# Patient Record
Sex: Female | Born: 1935
Health system: Southern US, Community
[De-identification: ages and names within clinical notes are randomized; demographics above are authoritative.]

## PROBLEM LIST (undated history)

## (undated) DIAGNOSIS — M810 Age-related osteoporosis without current pathological fracture: Secondary | ICD-10-CM

## (undated) DIAGNOSIS — I10 Essential (primary) hypertension: Secondary | ICD-10-CM

## (undated) DIAGNOSIS — M4800 Spinal stenosis, site unspecified: Secondary | ICD-10-CM

## (undated) DIAGNOSIS — E785 Hyperlipidemia, unspecified: Secondary | ICD-10-CM

## (undated) DIAGNOSIS — M1990 Unspecified osteoarthritis, unspecified site: Secondary | ICD-10-CM

## (undated) DIAGNOSIS — M5430 Sciatica, unspecified side: Secondary | ICD-10-CM

## (undated) DIAGNOSIS — M8580 Other specified disorders of bone density and structure, unspecified site: Secondary | ICD-10-CM

## (undated) DIAGNOSIS — M5126 Other intervertebral disc displacement, lumbar region: Secondary | ICD-10-CM

## (undated) HISTORY — DX: Hyperlipidemia, unspecified: E78.5

## (undated) HISTORY — DX: Spinal stenosis, site unspecified: M48.00

## (undated) HISTORY — DX: Sciatica, unspecified side: M54.30

## (undated) HISTORY — DX: Other specified disorders of bone density and structure, unspecified site: M85.80

## (undated) HISTORY — DX: Age-related osteoporosis without current pathological fracture: M81.0

## (undated) HISTORY — PX: FRACTURE SURGERY: SHX138

## (undated) HISTORY — DX: Other intervertebral disc displacement, lumbar region: M51.26

---

## 1999-05-11 ENCOUNTER — Ambulatory Visit (HOSPITAL_COMMUNITY): Admission: RE | Admit: 1999-05-11 | Discharge: 1999-05-11 | Payer: Self-pay | Admitting: Internal Medicine

## 2004-03-05 ENCOUNTER — Emergency Department (HOSPITAL_COMMUNITY): Admission: EM | Admit: 2004-03-05 | Discharge: 2004-03-05 | Payer: Self-pay | Admitting: Emergency Medicine

## 2007-10-22 ENCOUNTER — Ambulatory Visit (HOSPITAL_COMMUNITY): Admission: RE | Admit: 2007-10-22 | Discharge: 2007-10-22 | Payer: Self-pay | Admitting: Chiropractic Medicine

## 2009-03-07 ENCOUNTER — Inpatient Hospital Stay (HOSPITAL_COMMUNITY): Admission: EM | Admit: 2009-03-07 | Discharge: 2009-03-09 | Payer: Self-pay | Admitting: Emergency Medicine

## 2009-03-09 ENCOUNTER — Encounter (INDEPENDENT_AMBULATORY_CARE_PROVIDER_SITE_OTHER): Payer: Self-pay | Admitting: Internal Medicine

## 2010-03-27 HISTORY — PX: OTHER SURGICAL HISTORY: SHX169

## 2010-06-28 LAB — BASIC METABOLIC PANEL
BUN: 19 mg/dL (ref 6–23)
CO2: 29 mEq/L (ref 19–32)
Calcium: 9.3 mg/dL (ref 8.4–10.5)
Chloride: 103 mEq/L (ref 96–112)
Creatinine, Ser: 0.98 mg/dL (ref 0.4–1.2)
GFR calc Af Amer: >60 mL/min (ref >60)
GFR calc non Af Amer: 55 mL/min — ABNORMAL LOW (ref >60)
Glucose, Bld: 100 mg/dL — ABNORMAL HIGH (ref 70–99)
Potassium: 3.2 mEq/L — ABNORMAL LOW (ref 3.5–5.1)
Sodium: 139 mEq/L (ref 135–145)

## 2010-06-28 LAB — COMPREHENSIVE METABOLIC PANEL
ALT: 24 U/L (ref 0–35)
AST: 31 U/L (ref 0–37)
Albumin: 4.1 g/dL (ref 3.5–5.2)
Alkaline Phosphatase: 101 U/L (ref 39–117)
BUN: 17 mg/dL (ref 6–23)
CO2: 29 mEq/L (ref 19–32)
Calcium: 10 mg/dL (ref 8.4–10.5)
Chloride: 97 mEq/L (ref 96–112)
Creatinine, Ser: 0.89 mg/dL (ref 0.4–1.2)
GFR calc Af Amer: >60 mL/min (ref >60)
GFR calc non Af Amer: >60 mL/min (ref >60)
Glucose, Bld: 108 mg/dL — ABNORMAL HIGH (ref 70–99)
Potassium: 4.9 mEq/L (ref 3.5–5.1)
Sodium: 135 mEq/L (ref 135–145)
Total Bilirubin: 1 mg/dL (ref 0.3–1.2)
Total Protein: 7.6 g/dL (ref 6.0–8.3)

## 2010-06-28 LAB — LIPID PANEL
Cholesterol: 170 mg/dL (ref 0–200)
HDL: 52 mg/dL (ref >39)
LDL Cholesterol: 96 mg/dL (ref 0–99)
Total CHOL/HDL Ratio: 3.3 RATIO
Triglycerides: 109 mg/dL (ref <150)
VLDL: 22 mg/dL (ref 0–40)

## 2010-06-28 LAB — CBC
HCT: 40.6 % (ref 36.0–46.0)
HCT: 43.4 % (ref 36.0–46.0)
Hemoglobin: 13.7 g/dL (ref 12.0–15.0)
Hemoglobin: 14.9 g/dL (ref 12.0–15.0)
MCHC: 33.8 g/dL (ref 30.0–36.0)
MCHC: 34.2 g/dL (ref 30.0–36.0)
MCV: 93.6 fL (ref 78.0–100.0)
MCV: 94.5 fL (ref 78.0–100.0)
Platelets: 252 10*3/uL (ref 150–400)
Platelets: 268 10*3/uL (ref 150–400)
RBC: 4.3 MIL/uL (ref 3.87–5.11)
RBC: 4.64 MIL/uL (ref 3.87–5.11)
RDW: 13.3 % (ref 11.5–15.5)
RDW: 13.3 % (ref 11.5–15.5)
WBC: 10.4 10*3/uL (ref 4.0–10.5)
WBC: 8.3 10*3/uL (ref 4.0–10.5)

## 2010-06-28 LAB — CARDIAC PANEL(CRET KIN+CKTOT+MB+TROPI)
CK, MB: 1.9 ng/mL (ref 0.3–4.0)
CK, MB: 1.9 ng/mL (ref 0.3–4.0)
Relative Index: INVALID (ref 0.0–2.5)
Relative Index: INVALID (ref 0.0–2.5)
Total CK: 69 U/L (ref 7–177)
Total CK: 86 U/L (ref 7–177)
Troponin I: 0.01 ng/mL (ref 0.00–0.06)
Troponin I: 0.02 ng/mL (ref 0.00–0.06)

## 2010-06-28 LAB — POCT CARDIAC MARKERS
CKMB, poc: 2.1 ng/mL (ref 1.0–8.0)
Myoglobin, poc: 109 ng/mL (ref 12–200)
Troponin i, poc: <0.05 ng/mL (ref 0.00–0.09)

## 2010-06-28 LAB — DIFFERENTIAL
Basophils Absolute: 0.1 10*3/uL (ref 0.0–0.1)
Basophils Relative: 1 % (ref 0–1)
Eosinophils Absolute: 0 10*3/uL (ref 0.0–0.7)
Eosinophils Relative: 0 % (ref 0–5)
Lymphocytes Relative: 25 % (ref 12–46)
Lymphs Abs: 2.6 10*3/uL (ref 0.7–4.0)
Monocytes Absolute: 1.4 10*3/uL — ABNORMAL HIGH (ref 0.1–1.0)
Monocytes Relative: 13 % — ABNORMAL HIGH (ref 3–12)
Neutro Abs: 6.3 10*3/uL (ref 1.7–7.7)
Neutrophils Relative %: 61 % (ref 43–77)

## 2010-06-28 LAB — CK TOTAL AND CKMB (NOT AT ARMC)
CK, MB: 2.2 ng/mL (ref 0.3–4.0)
Relative Index: INVALID (ref 0.0–2.5)
Total CK: 73 U/L (ref 7–177)

## 2010-06-28 LAB — T4, FREE: Free T4: 1.28 ng/dL (ref 0.80–1.80)

## 2010-06-28 LAB — TSH: TSH: 6.157 u[IU]/mL — ABNORMAL HIGH (ref 0.350–4.500)

## 2010-06-28 LAB — T3: T3, Total: 98 ng/dl (ref 80.0–204.0)

## 2010-06-28 LAB — TROPONIN I: Troponin I: 0.02 ng/mL (ref 0.00–0.06)

## 2010-07-28 ENCOUNTER — Ambulatory Visit: Payer: Medicare Other | Attending: Family Medicine

## 2010-07-28 DIAGNOSIS — M2569 Stiffness of other specified joint, not elsewhere classified: Secondary | ICD-10-CM | POA: Insufficient documentation

## 2010-07-28 DIAGNOSIS — IMO0001 Reserved for inherently not codable concepts without codable children: Secondary | ICD-10-CM | POA: Insufficient documentation

## 2010-07-28 DIAGNOSIS — M25569 Pain in unspecified knee: Secondary | ICD-10-CM | POA: Insufficient documentation

## 2010-07-28 DIAGNOSIS — M25559 Pain in unspecified hip: Secondary | ICD-10-CM | POA: Insufficient documentation

## 2010-07-28 DIAGNOSIS — R5381 Other malaise: Secondary | ICD-10-CM | POA: Insufficient documentation

## 2010-08-01 ENCOUNTER — Ambulatory Visit: Payer: Medicare Other | Admitting: Physical Therapy

## 2010-08-02 ENCOUNTER — Ambulatory Visit: Payer: Self-pay | Admitting: Physical Therapy

## 2010-08-03 ENCOUNTER — Ambulatory Visit: Payer: Medicare Other

## 2010-08-08 ENCOUNTER — Encounter: Payer: Medicare Other | Admitting: Physical Therapy

## 2010-08-10 ENCOUNTER — Ambulatory Visit: Payer: Medicare Other

## 2010-08-15 ENCOUNTER — Ambulatory Visit: Payer: Medicare Other | Admitting: Physical Therapy

## 2010-08-17 ENCOUNTER — Ambulatory Visit: Payer: Medicare Other | Admitting: Physical Therapy

## 2010-09-02 ENCOUNTER — Other Ambulatory Visit: Payer: Self-pay | Admitting: Neurosurgery

## 2010-09-02 DIAGNOSIS — M549 Dorsalgia, unspecified: Secondary | ICD-10-CM

## 2010-09-03 ENCOUNTER — Ambulatory Visit
Admission: RE | Admit: 2010-09-03 | Discharge: 2010-09-03 | Disposition: A | Discharge status: Home / Self Care | Payer: Medicare Other | Source: Ambulatory Visit | Attending: Neurosurgery | Admitting: Neurosurgery

## 2010-09-03 DIAGNOSIS — M549 Dorsalgia, unspecified: Secondary | ICD-10-CM

## 2010-09-07 ENCOUNTER — Other Ambulatory Visit: Payer: Self-pay | Admitting: Neurosurgery

## 2010-09-07 DIAGNOSIS — M549 Dorsalgia, unspecified: Secondary | ICD-10-CM

## 2010-09-07 DIAGNOSIS — M5410 Radiculopathy, site unspecified: Secondary | ICD-10-CM

## 2010-09-09 ENCOUNTER — Ambulatory Visit
Admission: RE | Admit: 2010-09-09 | Discharge: 2010-09-09 | Disposition: A | Discharge status: Home / Self Care | Payer: Medicare Other | Source: Ambulatory Visit | Attending: Neurosurgery | Admitting: Neurosurgery

## 2010-09-09 DIAGNOSIS — M549 Dorsalgia, unspecified: Secondary | ICD-10-CM

## 2010-09-09 DIAGNOSIS — M5410 Radiculopathy, site unspecified: Secondary | ICD-10-CM

## 2010-11-07 ENCOUNTER — Inpatient Hospital Stay (HOSPITAL_COMMUNITY)
Admission: EM | Admit: 2010-11-07 | Discharge: 2010-11-16 | DRG: 512 | Disposition: A | Discharge status: Skilled Nursing Facility | Payer: Medicare Other | Attending: Family Medicine | Admitting: Family Medicine

## 2010-11-07 ENCOUNTER — Emergency Department (HOSPITAL_COMMUNITY): Payer: Medicare Other

## 2010-11-07 DIAGNOSIS — E785 Hyperlipidemia, unspecified: Secondary | ICD-10-CM | POA: Diagnosis present

## 2010-11-07 DIAGNOSIS — K5900 Constipation, unspecified: Secondary | ICD-10-CM | POA: Diagnosis present

## 2010-11-07 DIAGNOSIS — E876 Hypokalemia: Secondary | ICD-10-CM | POA: Diagnosis not present

## 2010-11-07 DIAGNOSIS — D72829 Elevated white blood cell count, unspecified: Secondary | ICD-10-CM | POA: Diagnosis present

## 2010-11-07 DIAGNOSIS — I10 Essential (primary) hypertension: Secondary | ICD-10-CM | POA: Diagnosis present

## 2010-11-07 DIAGNOSIS — M519 Unspecified thoracic, thoracolumbar and lumbosacral intervertebral disc disorder: Secondary | ICD-10-CM | POA: Diagnosis present

## 2010-11-07 DIAGNOSIS — M4310 Spondylolisthesis, site unspecified: Secondary | ICD-10-CM | POA: Diagnosis present

## 2010-11-07 DIAGNOSIS — S52599A Other fractures of lower end of unspecified radius, initial encounter for closed fracture: Principal | ICD-10-CM | POA: Diagnosis present

## 2010-11-07 DIAGNOSIS — Y92009 Unspecified place in unspecified non-institutional (private) residence as the place of occurrence of the external cause: Secondary | ICD-10-CM

## 2010-11-07 DIAGNOSIS — W19XXXA Unspecified fall, initial encounter: Secondary | ICD-10-CM | POA: Diagnosis present

## 2010-11-07 LAB — DIFFERENTIAL
Basophils Absolute: 0.1 10*3/uL (ref 0.0–0.1)
Basophils Relative: 0 % (ref 0–1)
Eosinophils Absolute: 0.3 10*3/uL (ref 0.0–0.7)
Eosinophils Relative: 2 % (ref 0–5)
Lymphocytes Relative: 21 % (ref 12–46)
Lymphs Abs: 3.6 10*3/uL (ref 0.7–4.0)
Monocytes Absolute: 1.8 10*3/uL — ABNORMAL HIGH (ref 0.1–1.0)
Monocytes Relative: 11 % (ref 3–12)
Neutro Abs: 11.3 10*3/uL — ABNORMAL HIGH (ref 1.7–7.7)
Neutrophils Relative %: 66 % (ref 43–77)

## 2010-11-07 LAB — URINALYSIS, ROUTINE W REFLEX MICROSCOPIC
Glucose, UA: NEGATIVE mg/dL
Ketones, ur: NEGATIVE mg/dL
Leukocytes, UA: NEGATIVE
Nitrite: NEGATIVE
Protein, ur: NEGATIVE mg/dL
Urobilinogen, UA: 0.2 mg/dL (ref 0.0–1.0)

## 2010-11-07 LAB — CBC
HCT: 40.3 % (ref 36.0–46.0)
Hemoglobin: 14.2 g/dL (ref 12.0–15.0)
MCHC: 35.2 g/dL (ref 30.0–36.0)
MCV: 90.8 fL (ref 78.0–100.0)
Platelets: 322 10*3/uL (ref 150–400)
RBC: 4.44 MIL/uL (ref 3.87–5.11)
RDW: 12.7 % (ref 11.5–15.5)
WBC: 17.1 10*3/uL — ABNORMAL HIGH (ref 4.0–10.5)

## 2010-11-07 LAB — BASIC METABOLIC PANEL
CO2: 30 mEq/L (ref 19–32)
Calcium: 10.4 mg/dL (ref 8.4–10.5)
Creatinine, Ser: 0.69 mg/dL (ref 0.50–1.10)
GFR calc Af Amer: >60 mL/min (ref >60)
GFR calc non Af Amer: >60 mL/min (ref >60)
Sodium: 137 mEq/L (ref 135–145)

## 2010-11-07 LAB — MAGNESIUM: Magnesium: 2.1 mg/dL (ref 1.5–2.5)

## 2010-11-07 LAB — PRO B NATRIURETIC PEPTIDE: Pro B Natriuretic peptide (BNP): 107.9 pg/mL (ref 0–450)

## 2010-11-07 LAB — TROPONIN I: Troponin I: <0.3 ng/mL

## 2010-11-08 DIAGNOSIS — I6350 Cerebral infarction due to unspecified occlusion or stenosis of unspecified cerebral artery: Secondary | ICD-10-CM

## 2010-11-08 LAB — COMPREHENSIVE METABOLIC PANEL
AST: 20 U/L (ref 0–37)
Albumin: 3.5 g/dL (ref 3.5–5.2)
Alkaline Phosphatase: 91 U/L (ref 39–117)
BUN: 21 mg/dL (ref 6–23)
Creatinine, Ser: 0.61 mg/dL (ref 0.50–1.10)
Potassium: 3.5 mEq/L (ref 3.5–5.1)
Total Protein: 6.5 g/dL (ref 6.0–8.3)

## 2010-11-08 LAB — CARDIAC PANEL(CRET KIN+CKTOT+MB+TROPI)
CK, MB: 3.8 ng/mL (ref 0.3–4.0)
Relative Index: 2.9 — ABNORMAL HIGH (ref 0.0–2.5)
Relative Index: 2.5 (ref 0.0–2.5)
Total CK: 128 U/L (ref 7–177)
Troponin I: <0.3 ng/mL (ref <0.30)
Troponin I: <0.3 ng/mL (ref <0.30)

## 2010-11-08 LAB — SURGICAL PCR SCREEN
MRSA, PCR: NEGATIVE
Staphylococcus aureus: POSITIVE — AB

## 2010-11-08 LAB — TSH: TSH: 2.301 u[IU]/mL (ref 0.350–4.500)

## 2010-11-08 LAB — CK TOTAL AND CKMB (NOT AT ARMC): CK, MB: 4.6 ng/mL — ABNORMAL HIGH (ref 0.3–4.0)

## 2010-11-08 LAB — CBC
HCT: 35.7 % — ABNORMAL LOW (ref 36.0–46.0)
MCV: 89.9 fL (ref 78.0–100.0)
RBC: 3.97 MIL/uL (ref 3.87–5.11)
RDW: 12.8 % (ref 11.5–15.5)
WBC: 14.6 10*3/uL — ABNORMAL HIGH (ref 4.0–10.5)

## 2010-11-08 LAB — URINE CULTURE

## 2010-11-09 LAB — CBC
HCT: 36.9 % (ref 36.0–46.0)
Hemoglobin: 12.7 g/dL (ref 12.0–15.0)
RBC: 4.06 MIL/uL (ref 3.87–5.11)
WBC: 14.6 10*3/uL — ABNORMAL HIGH (ref 4.0–10.5)

## 2010-11-09 LAB — COMPREHENSIVE METABOLIC PANEL
ALT: 16 U/L (ref 0–35)
Alkaline Phosphatase: 89 U/L (ref 39–117)
CO2: 25 mEq/L (ref 19–32)
Chloride: 103 mEq/L (ref 96–112)
GFR calc Af Amer: >60 mL/min (ref >60)
GFR calc non Af Amer: >60 mL/min (ref >60)
Glucose, Bld: 102 mg/dL — ABNORMAL HIGH (ref 70–99)
Potassium: 3.5 mEq/L (ref 3.5–5.1)
Sodium: 137 mEq/L (ref 135–145)
Total Bilirubin: 2.1 mg/dL — ABNORMAL HIGH (ref 0.3–1.2)
Total Protein: 6.5 g/dL (ref 6.0–8.3)

## 2010-11-10 LAB — BASIC METABOLIC PANEL
GFR calc Af Amer: >60 mL/min (ref >60)
GFR calc non Af Amer: >60 mL/min (ref >60)
Glucose, Bld: 95 mg/dL (ref 70–99)
Potassium: 3.3 mEq/L — ABNORMAL LOW (ref 3.5–5.1)
Sodium: 137 mEq/L (ref 135–145)

## 2010-11-10 LAB — CBC
HCT: 36.1 % (ref 36.0–46.0)
MCHC: 33.8 g/dL (ref 30.0–36.0)
Platelets: 277 10*3/uL (ref 150–400)
RDW: 13.1 % (ref 11.5–15.5)

## 2010-11-10 LAB — HEPATIC FUNCTION PANEL
Albumin: 2.9 g/dL — ABNORMAL LOW (ref 3.5–5.2)
Total Bilirubin: 1.7 mg/dL — ABNORMAL HIGH (ref 0.3–1.2)

## 2010-11-11 ENCOUNTER — Other Ambulatory Visit (HOSPITAL_COMMUNITY): Payer: Self-pay

## 2010-11-11 LAB — CBC
HCT: 34.3 % — ABNORMAL LOW (ref 36.0–46.0)
Hemoglobin: 11.8 g/dL — ABNORMAL LOW (ref 12.0–15.0)
WBC: 14.1 10*3/uL — ABNORMAL HIGH (ref 4.0–10.5)

## 2010-11-11 LAB — URINALYSIS, ROUTINE W REFLEX MICROSCOPIC
Bilirubin Urine: NEGATIVE
Glucose, UA: NEGATIVE mg/dL
Ketones, ur: NEGATIVE mg/dL
Nitrite: NEGATIVE
Protein, ur: NEGATIVE mg/dL
pH: 5.5 (ref 5.0–8.0)

## 2010-11-11 LAB — COMPREHENSIVE METABOLIC PANEL
Alkaline Phosphatase: 84 U/L (ref 39–117)
BUN: 12 mg/dL (ref 6–23)
CO2: 24 mEq/L (ref 19–32)
Chloride: 101 mEq/L (ref 96–112)
GFR calc Af Amer: >60 mL/min (ref >60)
Glucose, Bld: 88 mg/dL (ref 70–99)
Potassium: 3.8 mEq/L (ref 3.5–5.1)
Total Bilirubin: 1.4 mg/dL — ABNORMAL HIGH (ref 0.3–1.2)

## 2010-11-11 LAB — DIFFERENTIAL
Basophils Absolute: 0.1 10*3/uL (ref 0.0–0.1)
Lymphocytes Relative: 15 % (ref 12–46)
Monocytes Absolute: 1.8 10*3/uL — ABNORMAL HIGH (ref 0.1–1.0)
Neutro Abs: 9.9 10*3/uL — ABNORMAL HIGH (ref 1.7–7.7)

## 2010-11-11 LAB — URINE MICROSCOPIC-ADD ON

## 2010-11-12 LAB — URINE CULTURE

## 2010-11-12 LAB — BASIC METABOLIC PANEL
BUN: 14 mg/dL (ref 6–23)
Calcium: 9.3 mg/dL (ref 8.4–10.5)
Creatinine, Ser: 0.65 mg/dL (ref 0.50–1.10)
GFR calc Af Amer: >60 mL/min (ref >60)
GFR calc non Af Amer: >60 mL/min (ref >60)
Glucose, Bld: 94 mg/dL (ref 70–99)
Potassium: 4.6 mEq/L (ref 3.5–5.1)

## 2010-11-12 LAB — CBC
RBC: 3.86 MIL/uL — ABNORMAL LOW (ref 3.87–5.11)
WBC: 11 10*3/uL — ABNORMAL HIGH (ref 4.0–10.5)

## 2010-11-12 NOTE — Consult Note (Signed)
  NAMEJAXYN, Tracy Rogers NO.:  [id number]  MEDICAL RECORD NO.:  [id number]  LOCATION:  5507                         FACILITY:  [facility]  PHYSICIAN:  Hilda Lias, M.D.   DATE OF BIRTH:  July 18, 1930  DATE OF CONSULTATION:  11/10/2010 DATE OF DISCHARGE:                                CONSULTATION   The patient is a 77 year old female who was admitted to the hospital for fracture of the right wrist after she fell trying to take care of her dog.  It seemed that she lost her balance and she fell.  She was seen by the hospitalist.  Later on, she was taken to surgery by Dr. Mina Marble. We were called for evaluation because she was having a low back pain radiating mostly to the left leg.  Clinically, she has a mild weakness of the left leg.  Reflexes are symmetrical.  Sensation seemed to be normal.  As the patient had been followed here in my office for many years, and she has case of the spondylolisthesis at the level of L4-5. She told me that she had an epidural injection several months ago and she got relief from the pain.  She is requesting another one while she is in the hospital.  We are going to call X-ray Department to see if they can do L4-5 epidural injection to help Tracy Rogers while she is in the hospital.  Once she goes home, I will be more than glad to follow her in my office for her back condition.          ______________________________ Hilda Lias, M.D.     EB/MEDQ  D:  11/10/2010  T:  11/11/2010  Job:  [id number]  Electronically Signed by Hilda Lias M.D. on 11/12/2010 09:49:19 AM

## 2010-11-13 LAB — BASIC METABOLIC PANEL
CO2: 27 mEq/L (ref 19–32)
Calcium: 9.8 mg/dL (ref 8.4–10.5)
Chloride: 97 mEq/L (ref 96–112)
Glucose, Bld: 102 mg/dL — ABNORMAL HIGH (ref 70–99)
Potassium: 3.7 mEq/L (ref 3.5–5.1)
Sodium: 135 mEq/L (ref 135–145)

## 2010-11-14 ENCOUNTER — Inpatient Hospital Stay (HOSPITAL_COMMUNITY): Payer: Medicare Other

## 2010-11-14 DIAGNOSIS — S52509A Unspecified fracture of the lower end of unspecified radius, initial encounter for closed fracture: Secondary | ICD-10-CM

## 2010-11-14 DIAGNOSIS — S52609A Unspecified fracture of lower end of unspecified ulna, initial encounter for closed fracture: Secondary | ICD-10-CM

## 2010-11-14 DIAGNOSIS — M47817 Spondylosis without myelopathy or radiculopathy, lumbosacral region: Secondary | ICD-10-CM

## 2010-11-14 LAB — DIFFERENTIAL
Basophils Relative: 1 % (ref 0–1)
Lymphocytes Relative: 25 % (ref 12–46)
Monocytes Absolute: 1.2 10*3/uL — ABNORMAL HIGH (ref 0.1–1.0)
Monocytes Relative: 12 % (ref 3–12)
Neutro Abs: 5.8 10*3/uL (ref 1.7–7.7)
Neutrophils Relative %: 58 % (ref 43–77)

## 2010-11-14 LAB — CBC
HCT: 37.5 % (ref 36.0–46.0)
Hemoglobin: 12.9 g/dL (ref 12.0–15.0)
MCH: 31.6 pg (ref 26.0–34.0)
MCHC: 34.4 g/dL (ref 30.0–36.0)
RBC: 4.08 MIL/uL (ref 3.87–5.11)

## 2010-11-14 LAB — BASIC METABOLIC PANEL
BUN: 18 mg/dL (ref 6–23)
CO2: 31 mEq/L (ref 19–32)
Glucose, Bld: 88 mg/dL (ref 70–99)
Potassium: 4.2 mEq/L (ref 3.5–5.1)
Sodium: 138 mEq/L (ref 135–145)

## 2010-11-14 MED ORDER — IOHEXOL 180 MG/ML  SOLN
10.0000 mL | Freq: Once | INTRAMUSCULAR | Status: AC | PRN
  Administered 2010-11-14: 2 mL via INTRAVENOUS

## 2010-11-15 LAB — BASIC METABOLIC PANEL
BUN: 21 mg/dL (ref 6–23)
Chloride: 96 mEq/L (ref 96–112)
Creatinine, Ser: 0.66 mg/dL (ref 0.50–1.10)
Glucose, Bld: 101 mg/dL — ABNORMAL HIGH (ref 70–99)
Potassium: 4.2 mEq/L (ref 3.5–5.1)

## 2010-11-16 LAB — BASIC METABOLIC PANEL
BUN: 25 mg/dL — ABNORMAL HIGH (ref 6–23)
CO2: 30 mEq/L (ref 19–32)
Calcium: 10.7 mg/dL — ABNORMAL HIGH (ref 8.4–10.5)
Chloride: 95 mEq/L — ABNORMAL LOW (ref 96–112)
Creatinine, Ser: 0.78 mg/dL (ref 0.50–1.10)
Glucose, Bld: 92 mg/dL (ref 70–99)

## 2010-11-17 NOTE — Discharge Summary (Signed)
  NAMELILLAR, Tracy NO.:  [id number]  MEDICAL RECORD NO.:  [id number]  LOCATION:                                 FACILITY:  PHYSICIAN:  Pleas Koch, MD        DATE OF BIRTH:  [birth year]  DATE OF ADMISSION: DATE OF DISCHARGE:                              DISCHARGE SUMMARY   ADDENDUM:  [name] was seen on the day of discharge, was doing very well.  She complains still of having some right-handed discomfort as well as radicular sciatic pain down her left leg.  I explained to her that the epidural steroid injection probably takes 2-4 days for maximal effectiveness.  She does not wish any current back surgery at this time.  The patient also stated she has a scab over the right flexor aspect of the wrist splint, which had not been noticed until Therapy had seen it. On review of this area, it looks like it is 1 cm round darkened area of skin with no fluctuance.  I think this may be because of friction from the splint that the patient has not recognized and reassured the patient regarding the same.  She has had less pain; however, her blood pressure meds were slightly increased yesterday from hydrochlorothiazide 12.5 to 25 mg daily in view of her slightly increased blood pressure.  The patient was seen on the day of discharge, was doing fairly well. The temperature was 97.9, pulse of 77, respiration of 18, blood pressure 117-174 to 62-91.  O2 sats 98% on room air.  Please note change in medications as below from prior dictation.  She will go home on hydrochlorothiazide 25 mg daily.  Chest, clinically clear.  Extraocular movements intact.  No CVA tenderness.  S1, S2.  No murmurs, rubs, or gallops.  Normal sinus rhythm.  Neuro, grossly intact.  Her demeanor was delightful.  It was pleasure taking care of this patient.  Please dictate this as stat and copy to [facility] in addition to prior dictation.     ______________________________ Pleas Koch, MD     JS/MEDQ  D:  11/16/2010  T:  11/16/2010  Job:  [id number]  Electronically Signed by Pleas Koch MD on 11/17/2010 12:33:28 PM

## 2010-11-22 NOTE — Consult Note (Signed)
  NAMESYEDA, PRICKETT        ACCOUNT NO.:  [id number]  MEDICAL RECORD NO.:  [id number]  LOCATION:  5507                         FACILITY:  [facility]  PHYSICIAN:  Artist Pais. Jasiel Belisle, M.D.DATE OF BIRTH:  [birth year]  DATE OF CONSULTATION:  11/07/2010 DATE OF DISCHARGE:                                CONSULTATION   REQUESTING PHYSICIAN:  Dr. Patrica Duel.  REASON FOR CONSULTATION:  Ms. Callins is a 77 year old right-hand- dominant female who fell earlier today in her home and presents today with a displaced distal radius fracture on her dominant right wrist. She is a fairly healthy 77 year old female.  Past medical history is significant for hypertension, chronic low back pain, hyperlipidemia. She has no drug allergies by history.  She on exam had obvious deformity to her right hand and wrist.  X-ray showed a significant displaced distal radius fracture with dorsal displacement and shortening.  She was given 2% plain lidocaine hematoma block.  Finger trap traction was applied.  Reduction was performed.  Postreduction films showed adequate reduction.  We discussed in great detail the recommendation of an open reduction and internal fixation.  She will be admitted to the Medicine Service for pain control and evaluation of other medical issues and we will do this electively the next 24-48 hours.     Artist Pais Mina Marble, M.D.     MAW/MEDQ  D:  11/08/2010  T:  11/09/2010  Job:  [id number]  Electronically Signed by Dairl Ponder M.D. on 11/22/2010 09:36:50 AM

## 2010-11-22 NOTE — Op Note (Signed)
  [name], Tracy Rogers        ACCOUNT NO.:  [id number]  MEDICAL RECORD NO.:  [id number]  LOCATION:  5507                         FACILITY:  [facility]  PHYSICIAN:  Artist Pais. Koral Thaden, M.D.DATE OF BIRTH:  04/11/1930  DATE OF PROCEDURE:  11/09/2010 DATE OF DISCHARGE:                              OPERATIVE REPORT   PREOPERATIVE DIAGNOSIS:  Displaced intra-articular fracture of the distal radius, right side.  POSTOPERATIVE DIAGNOSIS:  Displaced intra-articular fracture of the distal radius, right side.  PROCEDURE:  Open reduction and internal fixation of above using DVR plate and screws.  SURGEON:  Artist Pais. Mina Marble, MD.  ASSISTANT:  None.  ANESTHESIA:  General and axis block.  TOURNIQUET TIME:  36 minutes.  No complication.  No drains.  The patient was taken to the operating suite.  After induction of adequate general anesthesia, right upper extremity is prepped and draped in sterile fashion.  An Esmarch was used to exsanguinate the limb. Tourniquet was inflated to 265 mmHg.  At this point in time, incision was made volarly of the FCR tendon.  The interval between the FCR and radial artery was developed down to the level pronator quadratus.  This was subperiosteally stripped off its radius revealing the intra- articular fracture of distal radius, 3-4 parts.  The brachioradialis and first dorsal compartment carefully released off the radial styloid fragment.  Once this was done, longitudinal traction, flexion and ulnar deviation was used to reduce the fracture.  A standard DVR plate was fast in the lower aspect of the distal radius to the slotted hole in the plate.  Intraoperative fluoroscopy was then used to determine adequate position.  Once this was done, it was secured with two more cortical screws proximally followed by smooth distally.  Again intraoperative fluoroscopy revealed adequate reduction in AP, lateral, and oblique view.  The wound was  thoroughly irrigated.  Hemostasis was achieved with bipolar electrocautery, was loosely closed in layers of 2-0 undyed Vicryl to close the pronator quadratus over the plate and a 3-0 Prolene subcuticular stitch on the skin.  Steri-Strips, 4 x 4's, fluffs and a volar splint was applied. The patient tolerated the procedure well and returned in stable fashion.     Artist Pais Mina Marble, M.D.     MAW/MEDQ  D:  11/09/2010  T:  11/10/2010  Job:  [id number]  Electronically Signed by Dairl Ponder M.D. on 11/22/2010 09:36:53 AM

## 2010-12-01 NOTE — Discharge Summary (Signed)
NAMESHAMONE, WINZER NO.:  [id number]  MEDICAL RECORD NO.:  [id number]  LOCATION:  5507                         FACILITY:  [facility]  PHYSICIAN:  Ramiro Harvest, MD    DATE OF BIRTH:  08/26/1930  DATE OF ADMISSION:  11/07/2010 DATE OF DISCHARGE:  11/15/2010                        DISCHARGE SUMMARY    PRIMARY CARE PHYSICIAN:  Pam Drown, MD at [facility].  ORTHOPEDIC HAND SURGEON:  Artist Pais. Mina Marble, MD  NEUROSURGEON:  Hilda Lias, MD  DISCHARGE DIAGNOSES: 1. Right wrist fracture status post open reduction and internal     fixation on November 09, 2010 by Dr. Mina Marble. 2. Lumbar disk disease with severe left hip pain and left pretibial     area burning status post L4-L5 epidural injection on November 14, 2010. 3. Uncontrolled hypertension, partially due to essential hypertension     in combination with acute wrist and hip pain. 4. Chronic constipation. 5. Hyperlipidemia.  CONSULTATIONS: 1. Dr. Dairl Ponder, orthopedic hand surgeon, saw the patient on     November 07, 2010 at the time of admission and performed open     reduction and internal fixation of right wrist on November 09, 2010. 2. Dr. Hilda Lias of Neurosurgery, saw the patient on November 10, 2010, and she is now status post epidural injections of L4-L5.  HISTORY AND BRIEF HOSPITAL COURSE: 1. The patient is a very pleasant 77 year old female, who has a     history listed above.  She has lived independently at home;     however, she noticed that her balance was becoming worse and worse.     On August 2012, she lost her balance, fell, and injured her right     wrist.  She also fell and hit the right side of her head, but on     admission to the emergency department denied any vertigo or changes     in her vision.  She complained of some dizziness.  Further, she     denied any chest pain or palpitations or near-syncopal episodes     associated with the fall.  She was  admitted to [facility] for further evaluation and treatment.  She was seen in the     ED by Dr. Dairl Ponder, diagnosed her with a displaced distal     radius fracture on her dominant right wrist.  X-ray showed a     significant displaced distal radius fracture with dorsal     displacement and shortening.  The patient was given a 2% plan     lidocaine hematoma block and a finger traction was applied.     Reduction of the fracture was performed in the emergency     department.  Post reduction film showed adequate reduction.  The     patient was then evaluated for surgical clearance and ended up     having open reduction and internal fixation of the distal radius on     her right side.  On November 09, 2010, the surgery went well without     complication.  The patient has been in a volar splint since and has  done well. 2. Lumbar disk disease with left hip pain and left-sided pretibial     burning.  The patient complained of these symptoms during her     hospitalization.  She mentioned that she had seen Dr. Hilda Lias in the past and similar injections had helped.  Dr. Jeral Fruit     saw the patient in consultation on November 11, 2010, and ordered L4-     L5 epidural injection while she was here in the hospital.  This was     completed on November 14, 2010, the patient's pain is currently     improved.  She understands that it may be few more days until she     receives full benefit of her steroid injections.  The patient     understands that she will follow up with Dr. Jeral Fruit on an as-needed     basis as an outpatient. 3. Uncontrolled hypertension.  The patient did have episodes of     hypertension with systolic rising up into the low 200s.  This was     felt to be associated with her past history of hypertension,     compounded by her acute pain in both the right wrist and her left     hip.  The patient's lisinopril was increased and she was treated     with  pain medications.  Her metoprolol was maintained and over time     as her pain decreased, her blood pressure came down.  Over the last     several days, she has averaged 150s/70s. 4. Chronic constipation.  The patient complained that she frequently     had disimpact herself at home.  She was placed on stool softeners     in house.  Initially, she was unwilling to take these as she does     not like to take any unnecessary medications, but after being on     them for few days and realizing that she did not have to disimpact     herself any more, she agrees to continue to use the stool softeners     as an outpatient. 5. The patient has a history of hyperlipidemia.  This is controlled     with Lipitor. 6. The patient did have some leukocytosis during this hospitalization.     It was felt to be reactive first to her fracture and surgery and     then possibly to her steroid injections; however, today her white     blood cell count is within normal limits.  On November 14, 2010, it     was 10.  PHYSICAL EXAMINATION:  GENERAL:  The patient is alert and oriented, in no apparent distress.  Her demeanor is pleasant, cooperative. VITAL SIGNS:  Temperature is 98.4, pulse 70, respirations 20, blood pressure 153/77, O2 saturations 95% on room air. HEENT:  Head is atraumatic, normocephalic.  Eyes are anicteric with pupils are equal and round.  She has a resolving bruise over her right eye. NECK:  Supple with midline trachea.  No JVD.  No lymphadenopathy. CHEST:  Demonstrates no accessory muscle use.  She has no wheezes or crackles to my exam. HEART:  Regular rate and rhythm without obvious murmurs, rubs, or gallops. ABDOMEN:  Soft, nontender, nondistended with good bowel sounds. EXTREMITIES:  Her left upper extremity and bilateral lower extremities show no clubbing, cyanosis, or edema.  Her left upper extremity is in a volar splint.  She has  minimal swelling around her fingers and good capillary  refill of her left-sided fingers. SKIN:  As mentioned, she has resolving bruise over her right eye, otherwise no rashes or lesions. NEURO:  Cranial nerves II through XII are grossly intact.  She has no facial asymmetries.  No obvious focal neuro deficits. PSYCHIATRIC:  The patient is alert and oriented, demeanor pleasant, cooperative.  Grooming is excellent.  PERTINENT LABS:  This hospitalization on admission November 07, 2010, the patient had a white count of 17.1, hemoglobin 14.2, hematocrit 40.3, platelets 322.  Sodium 137, potassium 4.1, chloride 97, bicarb 30, glucose 161, BUN 26, creatinine 0.69.  Her cardiac enzymes were cycled. Her troponins were found to be less than 0.3 x3.  BNP was 107.9. Urinary analysis on admission was clean.  A repeat UA on November 11, 2010, showed a large amount of blood, small leukocytes, 3-6 white blood cells.  Urine culture from November 11, 2010, collection showed no growth, that is final.  BMET on November 15, 2010, sodium 136, potassium 4.2, chloride 96, bicarb 28, glucose 101, BUN 21, creatinine 0.66.  The patient did have an elevated bilirubin level at 1.7; however, this was primarily was 1.4 indirect and 0.3 direct.  Her serum albumin level remains low at 2.7.  PERTINENT RADIOLOGICAL EXAMS:  The patient had a chest x-ray on November 07, 2010, that showed aortic tortuosity, mild left basilar subsegmental atelectasis and mild cardiomegaly without edema.  She had a right wrist x-ray that showed dorsal Barton fracture of the distal radius and transverse fracture of ulnar styloid radius.  CT of her head on November 07, 2010, showed scalp hematoma, otherwise normal exam.  X-ray of her right shoulder, November 07, 2010, showed no acute bony abnormality demonstrated.  Post reduction x-ray of her right wrist showed significantly improved alignment of the distal radial comminuted fracture and she had an epidural done of her L4-L5 lumbar disks that was technically  successful and thus far has had no complications.  DISCHARGE MEDICATIONS: 1. Albuterol 2.5 mg per 3 mL nebulizer solution every 4 hours as     needed for shortness of breath. 2. Bisacodyl 10 mg suppositories one applied rectally daily as needed     for constipation. 3. Docusate sodium 100 mg caplets by mouth twice daily. 4. Hydrochlorothiazide 12.5 mg 1 tablet by mouth daily. 5. Lisinopril 20 mg 1 tablet by mouth daily. 6. Metoprolol 25 mg 1 tablet by mouth twice daily. 7. Pantoprazole 40 mEq 1 tablet by mouth daily. 8. Polyethylene glycol 17 grams by mouth in 4 ounces of liquid daily     as needed for constipation. 9. Potassium chloride 10 mEq 1 tablet by mouth twice daily. 10.Senna/docusate sodium 8.6-50 mg tablets, 2 tablets by mouth daily     as needed. 11.Lipitor 10 mg 1 tablet by mouth daily. 12.Oxycodone 10 mg half tablet by mouth three times a day as needed     for pain.  Please note her dose of hydrochlorothiazide has changed.  She is to be on half of a 12.5 mg tablet daily and now she is on a hold 12.5 mg tablet daily.  DISCHARGE INSTRUCTIONS:  She will be discharged to the skilled nursing facility rehab on November 16, 2010.  Activity is per physical therapy. Diet is low-sodium heart-healthy.  Follow up appointments include, Dr. Dairl Ponder on November 17, 2010 at 3 p.m. for follow up on the wrist fracture.  Dr. Gweneth Dimitri on November 25, 2010, at 10:45 a.m. follow  up for the patient's blood pressure and general medical health.  The patient is to see Dr. Hilda Lias as needed for back and hip pain.     Stephani Police, PA   ______________________________ Ramiro Harvest, MD    MLY/MEDQ  D:  11/15/2010  T:  11/15/2010  Job:  [id number]  cc:   Pam Drown, M.D. Hilda Lias, M.D. Artist Pais Mina Marble, M.D.  Electronically Signed by Algis Downs PA on 11/18/2010 08:48:49 AM Electronically Signed by Ramiro Harvest MD on 12/01/2010 12:20:33 PM

## 2010-12-12 NOTE — H&P (Signed)
Tracy Rogers, Tracy Rogers        ACCOUNT NO.:  [id number]  MEDICAL RECORD NO.:  [id number]  LOCATION:  [facility]                         FACILITY:  [facility]  PHYSICIAN:  Richarda Overlie, MD       DATE OF BIRTH:  04/03/1930  DATE OF ADMISSION:  11/07/2010 DATE OF DISCHARGE:                             HISTORY & PHYSICAL   PRIMARY CARE PHYSICIAN:  Pam Drown, MD.  CHIEF COMPLAINT:  Fall.  HISTORY OF PRESENT ILLNESS:  This is a 77 year old female with history of hypertension, history of dyslipidemia presents to the ER after a fall.  The patient states that she was trying to take care of her dog when she lost a balance and fell and injured her right wrist.  She also fell to the right and hit the right side of her head, but she denied any altered mental status or loss of consciousness.  She denied any vertigo or lightheadedness prior to the fall.  She complained of some dizziness after hitting her head, but she denied any palpitations, chest pain or near-syncopal episode prior to the fall.  She has not had any prior falls.  She lives alone with her dog.  She denies any ongoing history of shortness of breath, orthopnea, paroxysmal nocturnal dyspnea, dependent edema.  She does not have any chest pain.  She did have a Cardiolite stress test in 2010 at [facility], which was within normal limits. The patient states that she has had some chronic back pain due to herniated disk and has had some numbness and tingling located to the left shin, but denies any bowel or bladder incontinence or gait imbalance.  She denies any cough, fever, chills, sore throat or any urinary urgency, frequency or dysuria.  REVIEW OF SYSTEMS:  Ten-point review of systems was done as documented in HPI.  PAST SURGICAL HISTORY:  None.  PAST MEDICAL HISTORY: 1. Hypertension. 2. Dyslipidemia.  ALLERGIES:  No known drug allergies.  HOME MEDICATIONS:  Lisinopril, Lipitor, oxycodone, HCTZ.  SOCIAL HISTORY:   The patient lives alone.  She drinks occasionally.  She denies any history of smoking or alcohol use.  FAMILY HISTORY:  Father died of heart disease in his 19s.  PHYSICAL EXAMINATION:  VITAL SIGNS:  Blood pressure 193/100, pulse of 75, respirations 18, temperature 97.1. GENERAL:  Comfortable, in no acute cardiopulmonary distress. HEENT:  Pupils equal and reactive.  Extraocular movements are intact. NECK:  Supple.  No JVD. LUNGS:  Clear to auscultation bilaterally.  No wheezes.  No crackles. CARDIOVASCULAR:  Regular rate and rhythm.  No murmurs, rubs or gallops. ABDOMEN:  Obese, soft, nontender, nondistended. EXTREMITIES:  Without cyanosis, clubbing or edema. NEUROLOGIC:  Cranial nerves II through XII grossly intact. PSYCHIATRIC:  Appropriate mood and affect.  LABORATORY DATA:  WBC 17.1, hemoglobin 14.2, hematocrit 40.3 and platelet count of 322.  BMET; sodium 137, potassium 4.1, chloride 97, bicarb 30, glucose 161, BUN 26, creatinine 0.6, calcium 10.4.  DIAGNOSTIC DATA:  CT of the head without contrast shows scalp hematoma, otherwise normal exam.  Wrist x-ray shows subtle burden fracture of the distal radius, transverse fracture of the ulnar styloid and aortic tortuosity with mild left basilar subsegmental atelectasis, mild cardiomegaly without edema.  Renal  ultrasound shows unremarkable sonographic appearance of the kidney, the left kidney showed mild cortical thinning.  EKG shows left ventricular hypertrophy, normal sinus rhythm with a rate of 79 beats per minute.  ASSESSMENT: 1. Hypertensive urgency. 2. Fall with right radial wrist fracture. 3. Leukocytosis of unclear etiology, likely secondary to stress     margination. 4. Acute on chronic low back pain. 5. Dyslipidemia.  PLAN:  The patient will be admitted to telemetry.  We will cycle cardiac enzymes in the setting of her fall.  Obtain a 2-D echo in the setting of perioperative workup.  The patient did have a negative  stress test 2 years ago.  We will continue her perioperative beta blocker.  The patient will also receive p.r.n. hydralazine for better blood pressure control and pain control will be achieved by Tylenol, hydrocodone.  Dr. Freddie Apley has seen her in the ER from the Orthopedic service and she is anticipated to probably have surgery tomorrow.  The patient is a full code.     Richarda Overlie, MD     NA/MEDQ  D:  11/07/2010  T:  11/07/2010  Job:  [id number]  Electronically Signed by Richarda Overlie MD on 12/12/2010 12:00:18 PM

## 2011-04-16 ENCOUNTER — Encounter (HOSPITAL_COMMUNITY): Payer: Self-pay | Admitting: *Deleted

## 2011-04-16 ENCOUNTER — Other Ambulatory Visit: Payer: Self-pay

## 2011-04-16 ENCOUNTER — Emergency Department (HOSPITAL_COMMUNITY)
Admission: EM | Admit: 2011-04-16 | Discharge: 2011-04-16 | Disposition: A | Discharge status: Home / Self Care | Payer: Medicare Other | Attending: Emergency Medicine | Admitting: Emergency Medicine

## 2011-04-16 DIAGNOSIS — Z79899 Other long term (current) drug therapy: Secondary | ICD-10-CM | POA: Insufficient documentation

## 2011-04-16 DIAGNOSIS — I10 Essential (primary) hypertension: Secondary | ICD-10-CM

## 2011-04-16 DIAGNOSIS — M129 Arthropathy, unspecified: Secondary | ICD-10-CM | POA: Insufficient documentation

## 2011-04-16 HISTORY — DX: Essential (primary) hypertension: I10

## 2011-04-16 HISTORY — DX: Unspecified osteoarthritis, unspecified site: M19.90

## 2011-04-16 LAB — URINALYSIS, ROUTINE W REFLEX MICROSCOPIC
Bilirubin Urine: NEGATIVE
Glucose, UA: NEGATIVE mg/dL
Hgb urine dipstick: NEGATIVE
Ketones, ur: NEGATIVE mg/dL
Leukocytes, UA: NEGATIVE
pH: 7 (ref 5.0–8.0)

## 2011-04-16 LAB — POCT I-STAT, CHEM 8
BUN: 20 mg/dL (ref 6–23)
Calcium, Ion: 1.27 mmol/L (ref 1.12–1.32)
Chloride: 107 mEq/L (ref 96–112)
Creatinine, Ser: 0.9 mg/dL (ref 0.50–1.10)
Glucose, Bld: 112 mg/dL — ABNORMAL HIGH (ref 70–99)

## 2011-04-16 NOTE — ED Notes (Signed)
Pt. States that her reason for coming to the hospital was d/t elevated BP. She stated that she took her BP at 1330 today and got a reading of 235/110 at home. She then took an Amlodipine 5mg .  At 1930, she took her BP again and BP was still elevated. She presumed to take another Amlodipine 5mg . She takes Lisinopril 30 mg qd, which she states she also had taken today as well.  When she presented to the ED, her BP was 203/101. Pt. Currently denies h/a, dizziness, vision deficits, chest pain or numbness or tingling in extremities.

## 2011-04-16 NOTE — ED Provider Notes (Signed)
History     CSN: [id number]  Arrival date & time 04/16/11  5956   First MD Initiated Contact with Patient 04/16/11 2144      Chief Complaint  Patient presents with  . Hypertension    (Consider location/radiation/quality/duration/timing/severity/associated sxs/prior treatment) Patient is a 78 y.o. female presenting with hypertension. The history is provided by the patient.  Hypertension This is a new problem. The current episode started 6 to 12 hours ago. The problem has not changed since onset.Pertinent negatives include no chest pain, no abdominal pain, no headaches and no shortness of breath. The symptoms are aggravated by nothing. The symptoms are relieved by nothing.    Past Medical History  Diagnosis Date  . Arthritis   . Hypertension     Past Surgical History  Procedure Date  . Fracture surgery     History reviewed. No pertinent family history.  History  Substance Use Topics  . Smoking status: Never Smoker   . Smokeless tobacco: Not on file  . Alcohol Use: No    OB History    Grav Para Term Preterm Abortions TAB SAB Ect Mult Living                  Review of Systems  Constitutional: Negative for fever and chills.  HENT: Negative for congestion and neck pain.   Eyes: Negative for visual disturbance.  Respiratory: Negative for cough, chest tightness and shortness of breath.   Cardiovascular: Negative for chest pain and leg swelling.  Gastrointestinal: Negative for nausea, vomiting, abdominal pain and diarrhea.  Genitourinary: Negative for dysuria.  Musculoskeletal: Negative for back pain.  Neurological: Negative for facial asymmetry, speech difficulty, weakness, numbness and headaches.  Hematological: Does not bruise/bleed easily.  Psychiatric/Behavioral: Negative for confusion.    Allergies  Review of patient's allergies indicates no known allergies.  Home Medications   Current Outpatient Rx  Name Route Sig Dispense Refill  . AMLODIPINE BESYLATE  10 MG PO TABS Oral Take 5 mg by mouth daily. Only takes at times when she thinks her pressure is not a reg script    . LISINOPRIL 30 MG PO TABS Oral Take 30 mg by mouth daily.    . TRAMADOL HCL 50 MG PO TABS Oral Take 50 mg by mouth every 6 (six) hours as needed.      BP 203/97  Pulse 83  Resp 15  SpO2 100%  Physical Exam  Nursing note and vitals reviewed. Constitutional: She is oriented to person, place, and time. She appears well-developed and well-nourished. No distress.  HENT:  Head: Normocephalic and atraumatic.  Mouth/Throat: Oropharynx is clear and moist.  Eyes: Conjunctivae and EOM are normal. Pupils are equal, round, and reactive to light.  Neck: Normal range of motion. Neck supple.  Cardiovascular: Normal rate, regular rhythm, normal heart sounds and intact distal pulses.   No murmur heard. Pulmonary/Chest: Effort normal and breath sounds normal. No respiratory distress. She has no wheezes. She has no rales. She exhibits no tenderness.  Abdominal: Soft. Bowel sounds are normal. There is no tenderness.  Musculoskeletal: Normal range of motion. She exhibits no edema and no tenderness.  Neurological: She is alert and oriented to person, place, and time. No cranial nerve deficit. She exhibits normal muscle tone. Coordination normal.  Skin: Skin is warm. No rash noted.    ED Course  Procedures (including critical care time)  Labs Reviewed  POCT I-STAT, CHEM 8 - Abnormal; Notable for the following:    Glucose, Bld  112 (*)    All other components within normal limits  URINALYSIS, ROUTINE W REFLEX MICROSCOPIC  I-STAT, CHEM 8   No results found. Results for orders placed during the hospital encounter of 04/16/11  URINALYSIS, ROUTINE W REFLEX MICROSCOPIC      Component Value Range   Color, Urine YELLOW  YELLOW    APPearance CLEAR  CLEAR    Specific Gravity, Urine 1.007  1.005 - 1.030    pH 7.0  5.0 - 8.0    Glucose, UA NEGATIVE  NEGATIVE (mg/dL)   Hgb urine dipstick  NEGATIVE  NEGATIVE    Bilirubin Urine NEGATIVE  NEGATIVE    Ketones, ur NEGATIVE  NEGATIVE (mg/dL)   Protein, ur NEGATIVE  NEGATIVE (mg/dL)   Urobilinogen, UA 0.2  0.0 - 1.0 (mg/dL)   Nitrite NEGATIVE  NEGATIVE    Leukocytes, UA NEGATIVE  NEGATIVE   POCT I-STAT, CHEM 8      Component Value Range   Sodium 143  135 - 145 (mEq/L)   Potassium 4.9  3.5 - 5.1 (mEq/L)   Chloride 107  96 - 112 (mEq/L)   BUN 20  6 - 23 (mg/dL)   Creatinine, Ser 1.61  0.50 - 1.10 (mg/dL)   Glucose, Bld 096 (*) 70 - 99 (mg/dL)   Calcium, Ion 0.45  4.09 - 1.32 (mmol/L)   TCO2 29  0 - 100 (mmol/L)   Hemoglobin 15.0  12.0 - 15.0 (g/dL)   HCT 81.1  91.4 - 78.2 (%)    Date: 04/16/2011  Rate: 81  Rhythm: normal sinus rhythm  QRS Axis: normal  Intervals: normal  ST/T Wave abnormalities: normal  Conduction Disutrbances:none  Narrative Interpretation:   Old EKG Reviewed: unchanged No significant change in EKG compared to 11/08/2010.  1. Hypertension       MDM   Patient's blood pressure was normal 4 days ago has not checked it since then. A patient checked her blood pressure she is feeling a little off but was not having any headache chest pain trouble breathing or strokelike symptoms and blood pressure her systolic was measuring in the 956 range. Patient's blood pressure in the emergency department with fluctuate from 180 up to 210 systolic she was completely asymptomatic. No recent change in her blood pressure medicines however she was discontinued on hydrochlorothiazide several weeks ago. He is followed by Durward Mallard as her primary care physician. Lab workup without any significant findings EKG without any acute changes. Patient is nontoxic in no acute distress recommend keeping a blood pressure log and contacting her primary care doctor in the next 2 days. Return for any strokelike symptoms severe headache trouble breathing or chest pain.        Shelda Jakes, MD 04/16/11 2325

## 2013-10-22 ENCOUNTER — Ambulatory Visit (INDEPENDENT_AMBULATORY_CARE_PROVIDER_SITE_OTHER): Payer: Medicare Other | Admitting: Internal Medicine

## 2013-10-22 ENCOUNTER — Encounter: Payer: Self-pay | Admitting: Internal Medicine

## 2013-10-22 VITALS — BP 134/82 | HR 81 | Temp 98.0°F | Resp 16 | Ht 62.0 in | Wt 133.0 lb

## 2013-10-22 DIAGNOSIS — I10 Essential (primary) hypertension: Secondary | ICD-10-CM | POA: Insufficient documentation

## 2013-10-22 DIAGNOSIS — M949 Disorder of cartilage, unspecified: Secondary | ICD-10-CM

## 2013-10-22 DIAGNOSIS — M5432 Sciatica, left side: Secondary | ICD-10-CM

## 2013-10-22 DIAGNOSIS — M8580 Other specified disorders of bone density and structure, unspecified site: Secondary | ICD-10-CM

## 2013-10-22 DIAGNOSIS — M48061 Spinal stenosis, lumbar region without neurogenic claudication: Secondary | ICD-10-CM | POA: Insufficient documentation

## 2013-10-22 DIAGNOSIS — E782 Mixed hyperlipidemia: Secondary | ICD-10-CM | POA: Insufficient documentation

## 2013-10-22 DIAGNOSIS — E559 Vitamin D deficiency, unspecified: Secondary | ICD-10-CM

## 2013-10-22 DIAGNOSIS — E785 Hyperlipidemia, unspecified: Secondary | ICD-10-CM

## 2013-10-22 DIAGNOSIS — M5430 Sciatica, unspecified side: Secondary | ICD-10-CM | POA: Insufficient documentation

## 2013-10-22 DIAGNOSIS — M899 Disorder of bone, unspecified: Secondary | ICD-10-CM

## 2013-10-22 LAB — CBC WITH DIFFERENTIAL/PLATELET
BASOS PCT: 1 % (ref 0–1)
Basophils Absolute: 0.1 10*3/uL (ref 0.0–0.1)
EOS ABS: 0.2 10*3/uL (ref 0.0–0.7)
Eosinophils Relative: 2 % (ref 0–5)
HCT: 43.2 % (ref 36.0–46.0)
HEMOGLOBIN: 14.7 g/dL (ref 12.0–15.0)
LYMPHS ABS: 2.4 10*3/uL (ref 0.7–4.0)
Lymphocytes Relative: 24 % (ref 12–46)
MCH: 30.4 pg (ref 26.0–34.0)
MCHC: 34 g/dL (ref 30.0–36.0)
MCV: 89.3 fL (ref 78.0–100.0)
MONOS PCT: 12 % (ref 3–12)
Monocytes Absolute: 1.2 10*3/uL — ABNORMAL HIGH (ref 0.1–1.0)
NEUTROS ABS: 6 10*3/uL (ref 1.7–7.7)
NEUTROS PCT: 61 % (ref 43–77)
PLATELETS: 325 10*3/uL (ref 150–400)
RBC: 4.84 MIL/uL (ref 3.87–5.11)
RDW: 13.7 % (ref 11.5–15.5)
WBC: 9.8 10*3/uL (ref 4.0–10.5)

## 2013-10-22 LAB — LIPID PANEL
Cholesterol: 292 mg/dL — ABNORMAL HIGH (ref 0–200)
HDL: 57 mg/dL (ref >39)
LDL CALC: 180 mg/dL — AB (ref 0–99)
TRIGLYCERIDES: 275 mg/dL — AB (ref <150)
Total CHOL/HDL Ratio: 5.1 Ratio
VLDL: 55 mg/dL — AB (ref 0–40)

## 2013-10-22 LAB — COMPREHENSIVE METABOLIC PANEL
ALBUMIN: 4.5 g/dL (ref 3.5–5.2)
ALK PHOS: 102 U/L (ref 39–117)
ALT: 18 U/L (ref 0–35)
AST: 26 U/L (ref 0–37)
BILIRUBIN TOTAL: 0.9 mg/dL (ref 0.2–1.2)
BUN: 28 mg/dL — ABNORMAL HIGH (ref 6–23)
CO2: 28 mEq/L (ref 19–32)
Calcium: 10 mg/dL (ref 8.4–10.5)
Chloride: 102 mEq/L (ref 96–112)
Creat: 1.01 mg/dL (ref 0.50–1.10)
GLUCOSE: 102 mg/dL — AB (ref 70–99)
POTASSIUM: 4.9 meq/L (ref 3.5–5.3)
SODIUM: 139 meq/L (ref 135–145)
TOTAL PROTEIN: 7.7 g/dL (ref 6.0–8.3)

## 2013-10-22 LAB — TSH: TSH: 2.552 u[IU]/mL (ref 0.350–4.500)

## 2013-10-22 NOTE — Patient Instructions (Signed)
Schedule bone density at [facility]    See me in 4-6 weeks 30 mins

## 2013-10-22 NOTE — Progress Notes (Signed)
   Subjective:    Patient ID: Tracy Rogers, female    DOB: Feb 07, 1931, 80 y.o.   MRN: [id number]  HPI  Tamela Oddi is a new pt here for primary care.   She lives alone,  Never married and has no children.       PMH sciatica and spinal stenosis  (epidural injection helps),  HTN, hyperlipidemia, osteopenia    She is concerned about SE profile of statins.  Formerly on lipitor but self discontinued 2 months ago.  She does take niacin 1000mg  3 times a week.  toleratin niacin fine.    No Known Allergies Past Medical History  Diagnosis Date  . Arthritis   . Hypertension   . Osteopenia   . Hyperlipidemia   . Lumbar herniated disc   . Sciatica   . Spinal stenosis    Past Surgical History  Procedure Laterality Date  . Fracture surgery     History   Social History  . Marital Status: Single    Spouse Name: N/A    Number of Children: N/A  . Years of Education: N/A   Occupational History  . Not on file.   Social History Main Topics  . Smoking status: Never Smoker   . Smokeless tobacco: Not on file  . Alcohol Use: 0.0 - 0.5 oz/week    0-1 drink(s) per week  . Drug Use: No  . Sexual Activity: No   Other Topics Concern  . Not on file   Social History Narrative  . No narrative on file   Family History  Problem Relation Age of Onset  . Dementia Mother   . Addison's disease Sister   . Arthritis Sister   . Heart disease Father    There are no active problems to display for this patient.  Current Outpatient Prescriptions on File Prior to Visit  Medication Sig Dispense Refill  . amLODipine (NORVASC) 10 MG tablet Take 5 mg by mouth daily. Only takes at times when she thinks her pressure is not a reg script      . lisinopril (PRINIVIL,ZESTRIL) 30 MG tablet Take 30 mg by mouth daily.       No current facility-administered medications on file prior to visit.      Review of Systems    see HPI Objective:   Physical Exam   Physical Exam  Nursing note and vitals  reviewed.  Constitutional: She is oriented to person, place, and time. She appears well-developed and well-nourished.  HENT:  Head: Normocephalic and atraumatic.  Cardiovascular: Normal rate and regular rhythm. Exam reveals no gallop and no friction rub.  No murmur heard.  Pulmonary/Chest: Breath sounds normal. She has no wheezes. She has no rales.  Neurological: She is alert and oriented to person, place, and time.  Skin: Skin is warm and dry.  Psychiatric: She has a normal mood and affect. Her behavior is normal.           Assessment & Plan:  HTN  conitnue amlodipine,  HCTZ  Will check all labs today  Hyperlipidemia  She will not take a statin  Check today  She is on niacin   Sciatica /spinal stenosis    Osteopenia  Will get Dexa from [facility]    DJD knees   See me in 4 weeks

## 2013-10-23 LAB — VITAMIN D 25 HYDROXY (VIT D DEFICIENCY, FRACTURES): VIT D 25 HYDROXY: 70 ng/mL (ref 30–89)

## 2013-10-27 ENCOUNTER — Telehealth: Payer: Self-pay | Admitting: *Deleted

## 2013-10-27 NOTE — Telephone Encounter (Signed)
Mailey called today wanting her lab results.  I told her they have not been reviewed by doctor yet. She asks that we call her with results.

## 2013-10-27 NOTE — Telephone Encounter (Signed)
I will be happy to call her w/ results after you review.

## 2013-10-27 NOTE — Telephone Encounter (Signed)
Spoke w pt & gave her results & instruction .

## 2013-10-27 NOTE — Telephone Encounter (Signed)
Tracy Rogers    Will give labs to you to mail to her  Her total cholesterol and bad cholesterol is very high.  She needs to see me in office.    Give her a 30 min appt to see me if she does not have a follow up

## 2013-10-31 ENCOUNTER — Encounter: Payer: Self-pay | Admitting: *Deleted

## 2013-11-03 ENCOUNTER — Encounter: Payer: Self-pay | Admitting: *Deleted

## 2013-12-02 ENCOUNTER — Ambulatory Visit (INDEPENDENT_AMBULATORY_CARE_PROVIDER_SITE_OTHER): Payer: Medicare Other | Admitting: Internal Medicine

## 2013-12-02 ENCOUNTER — Encounter: Payer: Self-pay | Admitting: Internal Medicine

## 2013-12-02 VITALS — BP 165/95 | HR 76 | Temp 97.7°F | Resp 16 | Wt 129.0 lb

## 2013-12-02 DIAGNOSIS — E785 Hyperlipidemia, unspecified: Secondary | ICD-10-CM

## 2013-12-02 DIAGNOSIS — E559 Vitamin D deficiency, unspecified: Secondary | ICD-10-CM

## 2013-12-02 DIAGNOSIS — I10 Essential (primary) hypertension: Secondary | ICD-10-CM

## 2013-12-02 LAB — LIPID PANEL
CHOLESTEROL: 264 mg/dL — AB (ref 0–200)
HDL: 55 mg/dL (ref >39)
LDL Cholesterol: 168 mg/dL — ABNORMAL HIGH (ref 0–99)
Total CHOL/HDL Ratio: 4.8 Ratio
Triglycerides: 205 mg/dL — ABNORMAL HIGH (ref <150)
VLDL: 41 mg/dL — ABNORMAL HIGH (ref 0–40)

## 2013-12-02 NOTE — Patient Instructions (Addendum)
See me in 6-8 week  30 min

## 2013-12-02 NOTE — Progress Notes (Signed)
Subjective:    Patient ID: Tracy Rogers, female    DOB: [birth year], 80 y.o.   MRN: [id number]  HPI Most recent visit    HTN conitnue amlodipine, HCTZ Will check all labs today  Hyperlipidemia She will not take a statin Check today She is on niacin  Sciatica /spinal stenosis  Osteopenia Will get Dexa from [facility]  DJD knees  See me in 4 weeks  Tracy Rogers is here today for follow up of her elevated cholesterol   See labs  She tells me she is not fasting  And brings recent labs from [place] done 5/21  Total 216  hdl 58 and ldl 117  She is on 1000 mg of niacin and coq 10  She does not take fish oil  .  She stopped her lipitor mid February or early march of this year   See BP  Repeat  134/78.  She usually runs 12o's at home per her report.   She does have an element of white coat syndrome    See vitamin D she is taking 4000 units daily    No Known Allergies Past Medical History  Diagnosis Date  . Arthritis   . Hypertension   . Osteopenia   . Hyperlipidemia   . Lumbar herniated disc   . Sciatica   . Spinal stenosis    Past Surgical History  Procedure Laterality Date  . Fracture surgery     History   Social History  . Marital Status: Single    Spouse Name: N/A    Number of Children: N/A  . Years of Education: N/A   Occupational History  . Not on file.   Social History Main Topics  . Smoking status: Never Smoker   . Smokeless tobacco: Not on file  . Alcohol Use: 0.0 - 0.5 oz/week    0-1 drink(s) per week  . Drug Use: No  . Sexual Activity: No   Other Topics Concern  . Not on file   Social History Narrative  . No narrative on file   Family History  Problem Relation Age of Onset  . Dementia Mother   . Addison's disease Sister   . Arthritis Sister   . Heart disease Father    Patient Active Problem List   Diagnosis Date Noted  . Vitamin D deficiency 10/22/2013  . HTN (hypertension) 10/22/2013  . Hyperlipidemia 10/22/2013  . Spinal stenosis of lumbar  region 10/22/2013  . Osteopenia 10/22/2013  . Sciatica 10/22/2013   Current Outpatient Prescriptions on File Prior to Visit  Medication Sig Dispense Refill  . amLODipine (NORVASC) 10 MG tablet Take 5 mg by mouth daily. Only takes at times when she thinks her pressure is not a reg script      . lisinopril (PRINIVIL,ZESTRIL) 20 MG tablet 20 mg daily. Take one and 1/2 daily       No current facility-administered medications on file prior to visit.        Review of Systems See HPI    Objective:   Physical Exam  Physical Exam   Nursing note and vitals reviewed.  Repeat exam normal   Constitutional: She is oriented to person, place, and time. She appears well-developed and well-nourished.  HENT:  Head: Normocephalic and atraumatic.  Cardiovascular: Normal rate and regular rhythm. Exam reveals no gallop and no friction rub.  No murmur heard.  Pulmonary/Chest: Breath sounds normal. She has no wheezes. She has no rales.  Neurological: She is alert and oriented to  person, place, and time.  Skin: Skin is warm and dry.  Psychiatric: She has a normal mood and affect. Her behavior is normal.         Assessment & Plan:  Hyperlipidemia  Will recheck fasting levels today.    She is to see me in 6-8 weeks  Vitamin D deficiency:    Ok to take 2000 units daily   HTN  Repeat BP normal

## 2013-12-03 ENCOUNTER — Encounter: Payer: Self-pay | Admitting: Internal Medicine

## 2013-12-03 ENCOUNTER — Telehealth: Payer: Self-pay | Admitting: Internal Medicine

## 2013-12-03 DIAGNOSIS — M810 Age-related osteoporosis without current pathological fracture: Secondary | ICD-10-CM | POA: Insufficient documentation

## 2013-12-15 NOTE — Telephone Encounter (Signed)
error 

## 2014-01-05 ENCOUNTER — Other Ambulatory Visit: Payer: Self-pay | Admitting: *Deleted

## 2014-01-05 MED ORDER — LISINOPRIL 20 MG PO TABS: 20.0000 mg | ORAL_TABLET | Freq: Every day | ORAL | Status: DC

## 2014-01-06 ENCOUNTER — Ambulatory Visit: Payer: Self-pay | Admitting: Internal Medicine

## 2014-01-09 ENCOUNTER — Other Ambulatory Visit: Payer: Self-pay

## 2014-01-27 ENCOUNTER — Ambulatory Visit (INDEPENDENT_AMBULATORY_CARE_PROVIDER_SITE_OTHER): Payer: Medicare Other | Admitting: Internal Medicine

## 2014-01-27 ENCOUNTER — Encounter: Payer: Self-pay | Admitting: Internal Medicine

## 2014-01-27 VITALS — BP 132/70 | HR 81 | Temp 98.1°F | Resp 16 | Ht 61.5 in | Wt 134.0 lb

## 2014-01-27 DIAGNOSIS — E785 Hyperlipidemia, unspecified: Secondary | ICD-10-CM

## 2014-01-27 DIAGNOSIS — M810 Age-related osteoporosis without current pathological fracture: Secondary | ICD-10-CM

## 2014-01-27 DIAGNOSIS — I10 Essential (primary) hypertension: Secondary | ICD-10-CM

## 2014-01-27 NOTE — Progress Notes (Signed)
Subjective:    Patient ID: Tracy Rogers, female    DOB: 1930/06/12, 80 y.o.   MRN: [id number]  HPI  12/02/2013 note Hyperlipidemia Will recheck fasting levels today. She is to see me in 6-8 weeks  Vitamin D deficiency: Ok to take 2000 units daily   HTN Repeat BP normal   Today  Hyperlipidemia:  Pt adamantly refuses statins.  She is on niacin and some OTC herb  For this  Osteoporosis:  Again adamantly refuses RX meds  For this despite my counsel of FX hip and its disability and complications  No Known Allergies Past Medical History  Diagnosis Date  . Arthritis   . Hypertension   . Osteopenia   . Hyperlipidemia   . Lumbar herniated disc   . Sciatica   . Spinal stenosis    Past Surgical History  Procedure Laterality Date  . Fracture surgery     History   Social History  . Marital Status: Single    Spouse Name: N/A    Number of Children: N/A  . Years of Education: N/A   Occupational History  . Not on file.   Social History Main Topics  . Smoking status: Never Smoker   . Smokeless tobacco: Never Used  . Alcohol Use: 0.0 - 0.5 oz/week    0-1 drink(s) per week  . Drug Use: No  . Sexual Activity: No   Other Topics Concern  . Not on file   Social History Narrative  . No narrative on file   Family History  Problem Relation Age of Onset  . Dementia Mother   . Addison's disease Sister   . Arthritis Sister   . Heart disease Father    Patient Active Problem List   Diagnosis Date Noted  . Osteoporosis, unspecified  see 09/2013  [facility] 12/03/2013  . Vitamin D deficiency 10/22/2013  . HTN (hypertension) 10/22/2013  . Hyperlipidemia 10/22/2013  . Spinal stenosis of lumbar region 10/22/2013  . Osteopenia 10/22/2013  . Sciatica 10/22/2013   Current Outpatient Prescriptions on File Prior to Visit  Medication Sig Dispense Refill  . amLODipine (NORVASC) 10 MG tablet Take 5 mg by mouth daily. Only takes at times when she thinks her pressure is not  a reg script    . Bilberry 1000 MG CAPS Take 1,000 mg by mouth daily.    . cholecalciferol (VITAMIN D) 1000 UNITS tablet Take 1,000 Units by mouth daily.    Marland Kitchen co-enzyme Q-10 30 MG capsule Take 200 mg by mouth daily.    Marland Kitchen lisinopril (PRINIVIL,ZESTRIL) 20 MG tablet Take 1 tablet (20 mg total) by mouth daily. Take one and 1/2 daily 135 tablet 0  . magnesium gluconate (MAGONATE) 500 MG tablet Take 500 mg by mouth daily.    . Multiple Vitamin (MULTIVITAMIN) capsule Take 1 capsule by mouth daily.    . multivitamin-lutein (OCUVITE-LUTEIN) CAPS capsule Take 1 capsule by mouth daily.    . niacin (NIASPAN) 500 MG CR tablet Take 500 mg by mouth 2 (two) times daily.     No current facility-administered medications on file prior to visit.      Review of Systems See HPI    Objective:   Physical Exam Physical Exam  Nursing note and vitals reviewed.   Repeat BP  132/70 Constitutional: She is oriented to person, place, and time. She appears well-developed and well-nourished.  HENT:  Head: Normocephalic and atraumatic.  Cardiovascular: Normal rate and regular rhythm. Exam reveals no gallop and no friction  rub.  No murmur heard.  Pulmonary/Chest: Breath sounds normal. She has no wheezes. She has no rales.  Neurological: She is alert and oriented to person, place, and time.  Skin: Skin is warm and dry.  Psychiatric: She has a normal mood and affect. Her behavior is normal.        Assessment & Plan:  Hyperlipidemia  Counseled pt I do not know the risk of liver or kidney injury with what she is taking.  Refuses Rx meds multiple times  Osteoporosis  Counseled of disability and potential mortality of hip fx.  She repeatedly delcines meds  HTN  Continue lisinopril 20 mg 1 and 1/2 daily  And amlodipine 5 mg daily  Repeat BP fine  Schedule CPE

## 2014-01-28 ENCOUNTER — Encounter: Payer: Self-pay | Admitting: Internal Medicine

## 2014-03-18 ENCOUNTER — Encounter: Payer: Self-pay | Admitting: Internal Medicine

## 2014-03-31 ENCOUNTER — Other Ambulatory Visit: Payer: Self-pay | Admitting: Internal Medicine

## 2014-03-31 NOTE — Telephone Encounter (Signed)
Refill request

## 2014-04-05 ENCOUNTER — Encounter: Payer: Self-pay | Admitting: Internal Medicine

## 2014-04-08 ENCOUNTER — Other Ambulatory Visit: Payer: Self-pay | Admitting: *Deleted

## 2014-04-08 MED ORDER — AMLODIPINE BESYLATE 10 MG PO TABS: ORAL_TABLET | ORAL | Status: DC

## 2014-04-13 ENCOUNTER — Other Ambulatory Visit: Payer: Self-pay | Admitting: *Deleted

## 2014-04-13 NOTE — Telephone Encounter (Signed)
Patient said that 10 mg of Norvasc was sent in with instructions of taking half a tablet daily. She would rather have the 5 mg and not worry about cutting it in half. She would also like to have a 90 day supply-eh

## 2014-04-14 MED ORDER — AMLODIPINE BESYLATE 5 MG PO TABS: ORAL_TABLET | ORAL | Status: DC

## 2014-04-25 ENCOUNTER — Emergency Department (HOSPITAL_BASED_OUTPATIENT_CLINIC_OR_DEPARTMENT_OTHER)
Admission: EM | Admit: 2014-04-25 | Discharge: 2014-04-25 | Disposition: A | Discharge status: Home / Self Care | Payer: Medicare Other | Attending: Emergency Medicine | Admitting: Emergency Medicine

## 2014-04-25 ENCOUNTER — Emergency Department (HOSPITAL_BASED_OUTPATIENT_CLINIC_OR_DEPARTMENT_OTHER): Payer: Medicare Other

## 2014-04-25 ENCOUNTER — Encounter (HOSPITAL_BASED_OUTPATIENT_CLINIC_OR_DEPARTMENT_OTHER): Payer: Self-pay

## 2014-04-25 DIAGNOSIS — R079 Chest pain, unspecified: Secondary | ICD-10-CM | POA: Insufficient documentation

## 2014-04-25 DIAGNOSIS — K6389 Other specified diseases of intestine: Secondary | ICD-10-CM | POA: Diagnosis not present

## 2014-04-25 DIAGNOSIS — R748 Abnormal levels of other serum enzymes: Secondary | ICD-10-CM | POA: Diagnosis not present

## 2014-04-25 DIAGNOSIS — Z79899 Other long term (current) drug therapy: Secondary | ICD-10-CM | POA: Insufficient documentation

## 2014-04-25 DIAGNOSIS — Z8739 Personal history of other diseases of the musculoskeletal system and connective tissue: Secondary | ICD-10-CM | POA: Diagnosis not present

## 2014-04-25 DIAGNOSIS — K2970 Gastritis, unspecified, without bleeding: Secondary | ICD-10-CM | POA: Insufficient documentation

## 2014-04-25 DIAGNOSIS — R0789 Other chest pain: Secondary | ICD-10-CM | POA: Diagnosis not present

## 2014-04-25 DIAGNOSIS — Z8639 Personal history of other endocrine, nutritional and metabolic disease: Secondary | ICD-10-CM | POA: Insufficient documentation

## 2014-04-25 DIAGNOSIS — I10 Essential (primary) hypertension: Secondary | ICD-10-CM | POA: Diagnosis not present

## 2014-04-25 LAB — TROPONIN I: Troponin I: <0.03 ng/mL (ref <0.031)

## 2014-04-25 LAB — CBC
HCT: 45.8 % (ref 36.0–46.0)
Hemoglobin: 15.3 g/dL — ABNORMAL HIGH (ref 12.0–15.0)
MCH: 30.4 pg (ref 26.0–34.0)
MCHC: 33.4 g/dL (ref 30.0–36.0)
MCV: 90.9 fL (ref 78.0–100.0)
PLATELETS: 315 10*3/uL (ref 150–400)
RBC: 5.04 MIL/uL (ref 3.87–5.11)
RDW: 13.3 % (ref 11.5–15.5)
WBC: 10.2 10*3/uL (ref 4.0–10.5)

## 2014-04-25 LAB — COMPREHENSIVE METABOLIC PANEL
ALK PHOS: 104 U/L (ref 39–117)
ALT: 22 U/L (ref 0–35)
ANION GAP: 6 (ref 5–15)
AST: 28 U/L (ref 0–37)
Albumin: 4.7 g/dL (ref 3.5–5.2)
BUN: 22 mg/dL (ref 6–23)
CO2: 25 mmol/L (ref 19–32)
Calcium: 9.2 mg/dL (ref 8.4–10.5)
Chloride: 102 mmol/L (ref 96–112)
Creatinine, Ser: 0.82 mg/dL (ref 0.50–1.10)
GFR calc Af Amer: 76 mL/min — ABNORMAL LOW (ref >90)
GFR, EST NON AFRICAN AMERICAN: 65 mL/min — AB (ref >90)
Glucose, Bld: 110 mg/dL — ABNORMAL HIGH (ref 70–99)
POTASSIUM: 3.4 mmol/L — AB (ref 3.5–5.1)
SODIUM: 133 mmol/L — AB (ref 135–145)
TOTAL PROTEIN: 7.4 g/dL (ref 6.0–8.3)
Total Bilirubin: 1 mg/dL (ref 0.3–1.2)

## 2014-04-25 LAB — LIPASE, BLOOD: Lipase: 71 U/L — ABNORMAL HIGH (ref 11–59)

## 2014-04-25 MED ORDER — IOHEXOL 300 MG/ML  SOLN
100.0000 mL | Freq: Once | INTRAMUSCULAR | Status: AC | PRN
  Administered 2014-04-25: 100 mL via INTRAVENOUS

## 2014-04-25 MED ORDER — ASPIRIN 81 MG PO CHEW
324.0000 mg | CHEWABLE_TABLET | Freq: Once | ORAL | Status: AC
  Administered 2014-04-25: 324 mg via ORAL
  Filled 2014-04-25: qty 4

## 2014-04-25 MED ORDER — IOHEXOL 300 MG/ML  SOLN
50.0000 mL | Freq: Once | INTRAMUSCULAR | Status: AC | PRN
  Administered 2014-04-25: 50 mL via ORAL

## 2014-04-25 NOTE — ED Provider Notes (Signed)
Care assumed from Dr. Canary Brim.  Second troponin negative.  CT showed evidence of gastritis, which I think is the cause of her discomfort.  She is hesitant to start a PPI.  She will alter her diet and follow up with her PCP closely.  I also recommended ranitidine, which she will consider.    Clinical Impression: 1. Chest pain, unspecified chest pain type   2. Elevated lipase   3. Gastritis       Houston Siren III, MD 04/25/14 [phone]

## 2014-04-25 NOTE — ED Notes (Signed)
C/o chest pressure since Thursday, and points to her epigastric area as the location. Was shoveling snow and ice on Tuesday and Wednesday. Normal sinus on monitor. HR s1 s2 regular, lungs clear. NAD noted.   Denies sob, palpitation, dizziness, pain. Admits little nausea and hx of GERD.

## 2014-04-25 NOTE — ED Provider Notes (Signed)
CSN: [id number]     Arrival date & time 04/25/14  1313 History   First MD Initiated Contact with Patient 04/25/14 1324     Chief Complaint  Patient presents with  . Chest Pain     (Consider location/radiation/quality/duration/timing/severity/associated sxs/prior Treatment) HPI  Pt presenting with c/o chest discomfort that has been ongoing for the past 2 days.  Pt states the discomfrot is in the center of her lower chest and upper abdomen. Pt states she was shoveling snow the 2 days prior and then developed aching chest pain- in center of chest.  Symptoms have been constant.  CP is not related to exertion.  No cough.  No difficulty breathing.  No fever/chills.  No leg swelling.  No nausea, no diaphoresis.  There are no other associated systemic symptoms, there are no other alleviating or modifying factors.   Past Medical History  Diagnosis Date  . Arthritis   . Hypertension   . Osteopenia   . Hyperlipidemia   . Lumbar herniated disc   . Sciatica   . Spinal stenosis   . Osteoporosis    Past Surgical History  Procedure Laterality Date  . Fracture surgery     Family History  Problem Relation Age of Onset  . Dementia Mother   . Addison's disease Sister   . Arthritis Sister   . Heart disease Father    History  Substance Use Topics  . Smoking status: Never Smoker   . Smokeless tobacco: Never Used  . Alcohol Use: 0.0 - 0.5 oz/week    0-1 drink(s) per week   OB History    No data available     Review of Systems  ROS reviewed and all otherwise negative except for mentioned in HPI    Allergies  Review of patient's allergies indicates no known allergies.  Home Medications   Prior to Admission medications   Medication Sig Start Date End Date Taking? Authorizing Provider  amLODipine (NORVASC) 5 MG tablet Take 1/2 tab daily 04/14/14   Lanice Shirts, MD  Bilberry 1000 MG CAPS Take 1,000 mg by mouth daily.    Historical Provider, MD  cholecalciferol (VITAMIN D) 1000  UNITS tablet Take 1,000 Units by mouth daily.    Historical Provider, MD  co-enzyme Q-10 30 MG capsule Take 200 mg by mouth daily.    Historical Provider, MD  Lecithin GRAN by Does not apply route.    Historical Provider, MD  lisinopril (PRINIVIL,ZESTRIL) 20 MG tablet TAKE 1&1/2 TABLETS BY MOUTH EVERY DAY 03/31/14   Lanice Shirts, MD  magnesium gluconate (MAGONATE) 500 MG tablet Take 500 mg by mouth daily.    Historical Provider, MD  Multiple Vitamin (MULTIVITAMIN) capsule Take 1 capsule by mouth daily.    Historical Provider, MD  multivitamin-lutein (OCUVITE-LUTEIN) CAPS capsule Take 1 capsule by mouth daily.    Historical Provider, MD  niacin (NIASPAN) 500 MG CR tablet Take 500 mg by mouth 2 (two) times daily.    Historical Provider, MD  Plant Sterols and Stanols (CHOLEST OFF PO) Take by mouth.    Historical Provider, MD   BP 190/105 mmHg  Pulse 81  Temp(Src) 98.1 F (36.7 C) (Oral)  Resp 25  Ht 5\' 2"  (1.575 m)  Wt 130 lb (58.968 kg)  BMI 23.77 kg/m2  SpO2 96%  Vitals reviewed Physical Exam  Physical Examination: General appearance - alert, well appearing, and in no distress Mental status - alert, oriented to person, place, and time Eyes - no conjunctival  injection, no scleral icterus Mouth - mucous membranes moist, pharynx normal without lesions Chest - clear to auscultation, no wheezes, rales or rhonchi, symmetric air entry Heart - normal rate, regular rhythm, normal S1, S2, no murmurs, rubs, clicks or gallops Abdomen - soft, mild epigastric tenderness, no gaurding and no rebound, nondistended, no masses or organomegaly, nabs Extremities - peripheral pulses normal, no pedal edema, no clubbing or cyanosis Skin - normal coloration and turgor, no rashes  ED Course  Procedures (including critical care time) Labs Review Labs Reviewed  CBC - Abnormal; Notable for the following:    Hemoglobin 15.3 (*)    All other components within normal limits  COMPREHENSIVE METABOLIC PANEL  - Abnormal; Notable for the following:    Sodium 133 (*)    Potassium 3.4 (*)    Glucose, Bld 110 (*)    GFR calc non Af Amer 65 (*)    GFR calc Af Amer 76 (*)    All other components within normal limits  LIPASE, BLOOD - Abnormal; Notable for the following:    Lipase 71 (*)    All other components within normal limits  TROPONIN I    Imaging Review Dg Chest 2 View  04/25/2014   CLINICAL DATA:  Chest pain for 2 days ; hypertension  EXAM: CHEST  2 VIEW  COMPARISON:  November 07, 2010  FINDINGS: There is no edema or consolidation. The heart size and pulmonary vascularity are within normal limits. Aorta is tortuous but stable. No pneumothorax. No adenopathy. No bone lesions.  IMPRESSION: No edema or consolidation. Stable aortic tortuosity, a finding that may be due to chronic hypertensive change.   Electronically Signed   By: Lowella Grip M.D.   On: 04/25/2014 13:55     EKG Interpretation   Date/Time:  Saturday April 25 2014 13:19:39 EST Ventricular Rate:  80 PR Interval:  146 QRS Duration: 90 QT Interval:  392 QTC Calculation: 452 R Axis:   -46 Text Interpretation:  Normal sinus rhythm Right atrial enlargement  Pulmonary disease pattern Left anterior fascicular block Left ventricular  hypertrophy with repolarization abnormality Abnormal ECG No significant  change since last tracing Confirmed by [facility]  MD, Cella Cappello [phone]) on  04/25/2014 2:33:52 PM      MDM   Final diagnoses:  Elevated lipase  Chest pain, unspecified chest pain type    Pt presenting with lower chest pain/epigastric pain over the past 2 days- pain has been constant and unremitting.  ekg reassuring.  Labs reassuring with the exception of mild elevation of lipase.  Doubt PE.  In terms of ACS this would be likely ruled out after first troponin given the fact that pain has been constant.  D/w patient and will check 3 hour delta troponin as well.  Due to elevated lipase and mild epigastric discomfort CT abdomen  obtained as well.  Recheck troponin will be due to 5pm, also awaiting CT scan results.  Anticipate discharge after these studies complete.      Threasa Beards, MD 04/25/14 [phone]

## 2014-04-25 NOTE — Discharge Instructions (Signed)
Gastritis, Adult Gastritis is soreness and swelling (inflammation) of the lining of the stomach. Gastritis can develop as a sudden onset (acute) or long-term (chronic) condition. If gastritis is not treated, it can lead to stomach bleeding and ulcers. CAUSES  Gastritis occurs when the stomach lining is weak or damaged. Digestive juices from the stomach then inflame the weakened stomach lining. The stomach lining may be weak or damaged due to viral or bacterial infections. One common bacterial infection is the Helicobacter pylori infection. Gastritis can also result from excessive alcohol consumption, taking certain medicines, or having too much acid in the stomach.  SYMPTOMS  In some cases, there are no symptoms. When symptoms are present, they may include:  Pain or a burning sensation in the upper abdomen.  Nausea.  Vomiting.  An uncomfortable feeling of fullness after eating. DIAGNOSIS  Your caregiver may suspect you have gastritis based on your symptoms and a physical exam. To determine the cause of your gastritis, your caregiver may perform the following:  Blood or stool tests to check for the H pylori bacterium.  Gastroscopy. A thin, flexible tube (endoscope) is passed down the esophagus and into the stomach. The endoscope has a light and camera on the end. Your caregiver uses the endoscope to view the inside of the stomach.  Taking a tissue sample (biopsy) from the stomach to examine under a microscope. TREATMENT  Depending on the cause of your gastritis, medicines may be prescribed. If you have a bacterial infection, such as an H pylori infection, antibiotics may be given. If your gastritis is caused by too much acid in the stomach, H2 blockers or antacids may be given. Your caregiver may recommend that you stop taking aspirin, ibuprofen, or other nonsteroidal anti-inflammatory drugs (NSAIDs). HOME CARE INSTRUCTIONS  Only take over-the-counter or prescription medicines as directed by  your caregiver.  If you were given antibiotic medicines, take them as directed. Finish them even if you start to feel better.  Drink enough fluids to keep your urine clear or pale yellow.  Avoid foods and drinks that make your symptoms worse, such as:  Caffeine or alcoholic drinks.  Chocolate.  Peppermint or mint flavorings.  Garlic and onions.  Spicy foods.  Citrus fruits, such as oranges, lemons, or limes.  Tomato-based foods such as sauce, chili, salsa, and pizza.  Fried and fatty foods.  Eat small, frequent meals instead of large meals. SEEK IMMEDIATE MEDICAL CARE IF:   You have black or dark red stools.  You vomit blood or material that looks like coffee grounds.  You are unable to keep fluids down.  Your abdominal pain gets worse.  You have a fever.  You do not feel better after 1 week.  You have any other questions or concerns. MAKE SURE YOU:  Understand these instructions.  Will watch your condition.  Will get help right away if you are not doing well or get worse. Document Released: 03/07/2001 Document Revised: 09/12/2011 Document Reviewed: 04/26/2011 [facility] Patient Information 2015 [place], [place]. This information is not intended to replace advice given to you by your health care provider. Make sure you discuss any questions you have with your health care provider.  Chest Pain (Nonspecific) It is often hard to give a specific diagnosis for the cause of chest pain. There is always a chance that your pain could be related to something serious, such as a heart attack or a blood clot in the lungs. You need to follow up with your health care provider  for further evaluation. CAUSES   Heartburn.  Pneumonia or bronchitis.  Anxiety or stress.  Inflammation around your heart (pericarditis) or lung (pleuritis or pleurisy).  A blood clot in the lung.  A collapsed lung (pneumothorax). It can develop suddenly on its own (spontaneous pneumothorax) or from  trauma to the chest.  Shingles infection (herpes zoster virus). The chest wall is composed of bones, muscles, and cartilage. Any of these can be the source of the pain.  The bones can be bruised by injury.  The muscles or cartilage can be strained by coughing or overwork.  The cartilage can be affected by inflammation and become sore (costochondritis). DIAGNOSIS  Lab tests or other studies may be needed to find the cause of your pain. Your health care provider may have you take a test called an ambulatory electrocardiogram (ECG). An ECG records your heartbeat patterns over a 24-hour period. You may also have other tests, such as:  Transthoracic echocardiogram (TTE). During echocardiography, sound waves are used to evaluate how blood flows through your heart.  Transesophageal echocardiogram (TEE).  Cardiac monitoring. This allows your health care provider to monitor your heart rate and rhythm in real time.  Holter monitor. This is a portable device that records your heartbeat and can help diagnose heart arrhythmias. It allows your health care provider to track your heart activity for several days, if needed.  Stress tests by exercise or by giving medicine that makes the heart beat faster. TREATMENT   Treatment depends on what may be causing your chest pain. Treatment may include:  Acid blockers for heartburn.  Anti-inflammatory medicine.  Pain medicine for inflammatory conditions.  Antibiotics if an infection is present.  You may be advised to change lifestyle habits. This includes stopping smoking and avoiding alcohol, caffeine, and chocolate.  You may be advised to keep your head raised (elevated) when sleeping. This reduces the chance of acid going backward from your stomach into your esophagus. Most of the time, nonspecific chest pain will improve within 2-3 days with rest and mild pain medicine.  HOME CARE INSTRUCTIONS   If antibiotics were prescribed, take them as  directed. Finish them even if you start to feel better.  For the next few days, avoid physical activities that bring on chest pain. Continue physical activities as directed.  Do not use any tobacco products, including cigarettes, chewing tobacco, or electronic cigarettes.  Avoid drinking alcohol.  Only take medicine as directed by your health care provider.  Follow your health care provider's suggestions for further testing if your chest pain does not go away.  Keep any follow-up appointments you made. If you do not go to an appointment, you could develop lasting (chronic) problems with pain. If there is any problem keeping an appointment, call to reschedule. SEEK MEDICAL CARE IF:   Your chest pain does not go away, even after treatment.  You have a rash with blisters on your chest.  You have a fever. SEEK IMMEDIATE MEDICAL CARE IF:   You have increased chest pain or pain that spreads to your arm, neck, jaw, back, or abdomen.  You have shortness of breath.  You have an increasing cough, or you cough up blood.  You have severe back or abdominal pain.  You feel nauseous or vomit.  You have severe weakness.  You faint.  You have chills. This is an emergency. Do not wait to see if the pain will go away. Get medical help at once. Call your local emergency services (  911 in [place] Do not drive yourself to the hospital. MAKE SURE YOU:   Understand these instructions.  Will watch your condition.  Will get help right away if you are not doing well or get worse. Document Released: 12/21/2004 Document Revised: 03/18/2013 Document Reviewed: 10/17/2007 [facility] Patient Information 2015 [place], [place]. This information is not intended to replace advice given to you by your health care provider. Make sure you discuss any questions you have with your health care provider.

## 2014-04-30 ENCOUNTER — Encounter: Payer: Self-pay | Admitting: Internal Medicine

## 2014-04-30 ENCOUNTER — Telehealth: Payer: Self-pay | Admitting: Internal Medicine

## 2014-04-30 NOTE — Telephone Encounter (Signed)
Will not prescribe anything without evaluating pt.   Give 30 min OV for ER follow up

## 2014-04-30 NOTE — Telephone Encounter (Signed)
Called and set up an appointment for Tracy Rogers next week-eh

## 2014-05-05 ENCOUNTER — Encounter: Payer: Self-pay | Admitting: Internal Medicine

## 2014-05-05 ENCOUNTER — Ambulatory Visit (INDEPENDENT_AMBULATORY_CARE_PROVIDER_SITE_OTHER): Payer: Medicare Other | Admitting: Internal Medicine

## 2014-05-05 VITALS — BP 136/80 | HR 81 | Resp 16 | Ht 61.5 in | Wt 132.0 lb

## 2014-05-05 DIAGNOSIS — R1013 Epigastric pain: Secondary | ICD-10-CM | POA: Diagnosis not present

## 2014-05-05 DIAGNOSIS — R935 Abnormal findings on diagnostic imaging of other abdominal regions, including retroperitoneum: Secondary | ICD-10-CM | POA: Insufficient documentation

## 2014-05-05 LAB — LIPASE: LIPASE: 46 U/L (ref 0–75)

## 2014-05-05 NOTE — Addendum Note (Signed)
Addended by: Gretchen Short on: 05/05/2014 10:26 AM   Modules accepted: Orders

## 2014-05-05 NOTE — Progress Notes (Addendum)
Subjective:    Patient ID: Tracy Rogers, female    DOB: May 16, 1930, 81 y.o.   MRN: [id number]  HPI 04/25/2013  ER note ED Provider Notes by Arbie Cookey, MD at 04/25/2014 6:41 PM    Author: Arbie Cookey, MD Service: Emergency Medicine Author Type: Physician   Filed: 04/25/2014 6:43 PM Note Time: 04/25/2014 6:41 PM Status: Signed   Editor: Arbie Cookey, MD (Physician)     Expand All Collapse All   Care assumed from Dr. Canary Brim. Second troponin negative. CT showed evidence of gastritis, which I think is the cause of her discomfort. She is hesitant to start a PPI. She will alter her diet and follow up with her PCP closely. I also recommended ranitidine, which she will consider.   Clinical Impression: 1. Chest pain, unspecified chest pain type   2. Elevated lipase   3. Gastritis           Today :  Tracy Rogers is here for ER follow up.  Was evaluated 1/30  for  Epigastric pain that had been occuring for 2-3 weeks with lots of heartburn.   She repeatedly refuses to take prescribed meds. She began taking Aloe vera and papaya enzymes and is now symptom free for past two weeks.         CXR unrevealing for acute process.    Troponins neg no acute change in EKG  See Ct report .  She has dilated CBD 8-12 mm and thickening of stomach wall  Lipase was elevated.     She brings BP log and SBP ranging from 117=133/77-80  Repeat my exam 136/70    No Known Allergies Past Medical History  Diagnosis Date  . Arthritis   . Hypertension   . Osteopenia   . Hyperlipidemia   . Lumbar herniated disc   . Sciatica   . Spinal stenosis   . Osteoporosis    Past Surgical History  Procedure Laterality Date  . Fracture surgery     History   Social History  . Marital Status: Single    Spouse Name: N/A    Number of Children: N/A  . Years of Education: N/A   Occupational History  . Not on file.   Social History Main Topics  . Smoking status:  Never Smoker   . Smokeless tobacco: Never Used  . Alcohol Use: 0.0 - 0.5 oz/week    0-1 drink(s) per week  . Drug Use: No  . Sexual Activity: No   Other Topics Concern  . Not on file   Social History Narrative   Family History  Problem Relation Age of Onset  . Dementia Mother   . Addison's disease Sister   . Arthritis Sister   . Heart disease Father    Patient Active Problem List   Diagnosis Date Noted  . Osteoporosis  Repeatedly refuses Rx meds or injectables.  See [facility] reports 12/03/2013  . Vitamin D deficiency 10/22/2013  . HTN (hypertension) 10/22/2013  . Hyperlipidemia  Repeatedly refuses  RX meds   10/22/2013  . Spinal stenosis of lumbar region 10/22/2013  . Sciatica 10/22/2013   Current Outpatient Prescriptions on File Prior to Visit  Medication Sig Dispense Refill  . amLODipine (NORVASC) 5 MG tablet Take 1/2 tab daily 90 tablet 1  . Bilberry 1000 MG CAPS Take 1,000 mg by mouth daily.    . cholecalciferol (VITAMIN D) 1000 UNITS tablet Take 1,000 Units by mouth daily.    Marland Kitchen  co-enzyme Q-10 30 MG capsule Take 200 mg by mouth daily.    . Lecithin GRAN by Does not apply route.    Marland Kitchen lisinopril (PRINIVIL,ZESTRIL) 20 MG tablet TAKE 1&1/2 TABLETS BY MOUTH EVERY DAY 135 tablet 0  . magnesium gluconate (MAGONATE) 500 MG tablet Take 500 mg by mouth daily.    . Multiple Vitamin (MULTIVITAMIN) capsule Take 1 capsule by mouth daily.    . multivitamin-lutein (OCUVITE-LUTEIN) CAPS capsule Take 1 capsule by mouth daily.    . niacin (NIASPAN) 500 MG CR tablet Take 500 mg by mouth 2 (two) times daily.    . Plant Sterols and Stanols (CHOLEST OFF PO) Take by mouth.     No current facility-administered medications on file prior to visit.       Review of Systems    see HPI Objective:   Physical Exam Physical Exam  Nursing note and vitals reviewed.  Constitutional: She is oriented to person, place, and time. She appears well-developed and well-nourished.  HENT:  Head:  Normocephalic and atraumatic.  Cardiovascular: Normal rate and regular rhythm. Exam reveals no gallop and no friction rub.  No murmur heard.  Pulmonary/Chest: Breath sounds normal. She has no wheezes. She has no rales.  ABd:   BS + soft nontender in all quadrants nondistended  No HSM   Neurological: She is alert and oriented to person, place, and time.  Skin: Skin is warm and dry.  Psychiatric: She has a normal mood and affect. Her behavior is normal.        Assessment & Plan:  Epigastric pain  Pt declines  PPI and wishes to continue with Aloe Vera and papaya  Will check H pylori  And lipase today  I gave copy of Ct to pt and counseled it is very important to follow up with GI as she will likely need an endoscopy and /or ERCP  HTN:  She tells me she does take her lisinopril and amlodipine    Continue meds  Diated CBD with antral thickinening  Will refer to [facility]  And stressed importance of going to appt.    Addendum: Received a phone call from lab stating serum H.Pylori test is no longer available. Pt will have to have GI do a breath test or do a stool sample. I have canceled that order for the serum test and notified Dr. Coralyn Mark. -MH

## 2014-05-06 NOTE — Progress Notes (Signed)
I spoke with patient in regards to her labs, and she has an appointment with [facility] April 1St.

## 2014-06-23 ENCOUNTER — Other Ambulatory Visit: Payer: Self-pay | Admitting: Internal Medicine

## 2014-06-23 NOTE — Telephone Encounter (Signed)
Refill request

## 2014-06-26 ENCOUNTER — Ambulatory Visit (INDEPENDENT_AMBULATORY_CARE_PROVIDER_SITE_OTHER): Payer: Medicare Other | Admitting: Internal Medicine

## 2014-06-26 ENCOUNTER — Encounter: Payer: Self-pay | Admitting: Internal Medicine

## 2014-06-26 VITALS — BP 152/68 | HR 80 | Ht 61.25 in | Wt 135.1 lb

## 2014-06-26 DIAGNOSIS — R933 Abnormal findings on diagnostic imaging of other parts of digestive tract: Secondary | ICD-10-CM

## 2014-06-26 DIAGNOSIS — R1013 Epigastric pain: Secondary | ICD-10-CM | POA: Diagnosis not present

## 2014-06-26 NOTE — Progress Notes (Signed)
Tracy Rogers [birth year] [id number]  Note: This dictation was prepared with Dragon digital system. Any transcriptional errors that result from this procedure are unintentional.   History of Present Illness: This is a 81 year old white female who was seen in January 2016 for  epigastric pain and had evaluation which included CT scan of the abdomen on 04/11/2014. Her common bile duct was borderline dilated to 8 mm without any mass affect. Intrahepatic radicles were not dilated. There was  mild nonspecific thickening of the gastric antrum. Her liver function tests at that point were normal with ALT of 22 and AST of 28. Her serum lipase was elevated at 71. The repeat was down to 46. Her hemoglobin of 15.3. Patient  declines taking  acid reducing medications and instead has been on vera juice which she has continued to this time. She also cut out all caffeine and reduce her meals to small portions. She takes occasional Tums and papaya enzymes. The pain has resolved almost completely. Satisfied with her self treatment, she is not actually interested in my GI consultation.Marland Kitchen Her bowel habits have been regular. There is no family history of colon cancer or gastric cancer. Both of her parents were born in [place]. Her mother died at age of 47. Pt was diagnosed with spinal stenosis in the past and was offered surgery but declined and now has of mild neuropathy in the left foot.She sems to be self righteous.    Past Medical History  Diagnosis Date  . Arthritis   . Hypertension   . Osteopenia   . Hyperlipidemia   . Lumbar herniated disc   . Sciatica   . Spinal stenosis   . Osteoporosis     Past Surgical History  Procedure Laterality Date  . Fracture surgery Right     wrist, plate inserted  . Lower back surgery  2012    Hernia disk    No Known Allergies  Family history and social history have been reviewed.  Review of Systems: Denies weight loss rectal bleeding. Denies dysphagia  The  remainder of the 10 point ROS is negative except as outlined in the H&P  Physical Exam: General Appearance Well developed, in no distress Eyes  Non icteric  HEENT  Non traumatic, normocephalic  Mouth No lesion, tongue papillated, no cheilosis Neck Supple without adenopathy, thyroid not enlarged, no carotid bruits, no JVD Lungs Clear to auscultation bilaterally COR Normal S1, normal S2, regular rhythm, no murmur, quiet precordium Abdomen soft relaxed, nontender. No bruit. Liver edge at costal margin. Normoactive bowel sounds Rectal small amount of soft Hemoccult negative stool Extremities  No pedal edema Skin No lesions Neurological Alert and oriented x 3 Psychological Normal mood and affect  Assessment and Plan:   81 year old white female who was having an episode of epigastric pain and on imaging of her abdomen gastric antral thickening. She is doing well now on self-management regimen which includes a papaya juice ,aloe vera and dietary modifications with exclusion  caffeine elimination. She is of the opinion that there is no need to proceed with further evaluation. She feels that she does not really want to know if she has cancer. I told her that gastric antral thickening could be seen as part of the peristaltic activity of the stomach and it could be anormal variant  but at the same time could represent early cancer and in that case would not necessarily be symptomatic at this stage.. We had a long discussion back and forth and she really  would prefer not to do the tests we offered to her. She agrees to  contact us when she does not feel well. I will be happy to see her in the future should she develops specific GI problems    Delfin Edis 06/26/2014

## 2014-06-26 NOTE — Patient Instructions (Addendum)
    I appreciate the opportunity to care for you. Dr Coralyn Mark

## 2014-06-30 ENCOUNTER — Encounter: Payer: Self-pay | Admitting: Internal Medicine

## 2014-06-30 ENCOUNTER — Ambulatory Visit (INDEPENDENT_AMBULATORY_CARE_PROVIDER_SITE_OTHER): Payer: Medicare Other | Admitting: Internal Medicine

## 2014-06-30 VITALS — BP 142/88 | HR 74 | Resp 16 | Ht 61.5 in | Wt 135.0 lb

## 2014-06-30 DIAGNOSIS — Z0000 Encounter for general adult medical examination without abnormal findings: Secondary | ICD-10-CM | POA: Diagnosis not present

## 2014-06-30 DIAGNOSIS — R935 Abnormal findings on diagnostic imaging of other abdominal regions, including retroperitoneum: Secondary | ICD-10-CM

## 2014-06-30 DIAGNOSIS — M810 Age-related osteoporosis without current pathological fracture: Secondary | ICD-10-CM

## 2014-06-30 DIAGNOSIS — I10 Essential (primary) hypertension: Secondary | ICD-10-CM

## 2014-06-30 DIAGNOSIS — Z0189 Encounter for other specified special examinations: Secondary | ICD-10-CM | POA: Diagnosis not present

## 2014-06-30 DIAGNOSIS — E785 Hyperlipidemia, unspecified: Secondary | ICD-10-CM | POA: Diagnosis not present

## 2014-06-30 LAB — POCT URINALYSIS DIPSTICK
Bilirubin, UA: NEGATIVE
Blood, UA: NEGATIVE
GLUCOSE UA: NEGATIVE
Ketones, UA: NEGATIVE
Leukocytes, UA: NEGATIVE
Nitrite, UA: NEGATIVE
Protein, UA: NEGATIVE
Spec Grav, UA: 1.01
UROBILINOGEN UA: NEGATIVE
pH, UA: 6.5

## 2014-06-30 LAB — LIPID PANEL
CHOLESTEROL: 293 mg/dL — AB (ref 0–200)
HDL: 63 mg/dL (ref >=46)
LDL CALC: 185 mg/dL — AB (ref 0–99)
Total CHOL/HDL Ratio: 4.7 Ratio
Triglycerides: 224 mg/dL — ABNORMAL HIGH (ref <150)
VLDL: 45 mg/dL — AB (ref 0–40)

## 2014-06-30 LAB — COMPLETE METABOLIC PANEL WITH GFR
ALBUMIN: 4.6 g/dL (ref 3.5–5.2)
ALK PHOS: 100 U/L (ref 39–117)
ALT: 22 U/L (ref 0–35)
AST: 28 U/L (ref 0–37)
BUN: 24 mg/dL — ABNORMAL HIGH (ref 6–23)
CALCIUM: 9.3 mg/dL (ref 8.4–10.5)
CO2: 23 meq/L (ref 19–32)
Chloride: 101 mEq/L (ref 96–112)
Creat: 0.8 mg/dL (ref 0.50–1.10)
GFR, EST NON AFRICAN AMERICAN: 69 mL/min
GFR, Est African American: 80 mL/min
Glucose, Bld: 77 mg/dL (ref 70–99)
POTASSIUM: 3.7 meq/L (ref 3.5–5.3)
SODIUM: 139 meq/L (ref 135–145)
TOTAL PROTEIN: 7.4 g/dL (ref 6.0–8.3)
Total Bilirubin: 1 mg/dL (ref 0.2–1.2)

## 2014-06-30 MED ORDER — LISINOPRIL 20 MG PO TABS: 30.0000 mg | ORAL_TABLET | Freq: Every day | ORAL | Status: DC

## 2014-06-30 MED ORDER — AMLODIPINE BESYLATE 5 MG PO TABS: ORAL_TABLET | ORAL | Status: DC

## 2014-06-30 NOTE — Progress Notes (Signed)
Subjective:    Patient ID: Tracy Rogers, female    DOB: April 03, 1930, 81 y.o.   MRN: [id number]  HPI 06/26/2014 GI Dr. Nichola Sizer note Assessment and Plan:   81 year old white female who was having an episode of epigastric pain and on imaging of her abdomen gastric antral thickening. She is doing well now on self-management regimen which includes a papaya juice ,aloe vera and dietary modifications with exclusion caffeine elimination. She is of the opinion that there is no need to proceed with further evaluation. She feels that she does not really want to know if she has cancer. I told her that gastric antral thickening could be seen as part of the peristaltic activity of the stomach and it could be anormal variant but at the same time could represent early cancer and in that case would not necessarily be symptomatic at this stage.. We had a long discussion back and forth and she really would prefer not to do the tests we offered to her. She agrees to contact us when she does not feel well. I will be happy to see her in the future should she develops specific GI problems  05/05/2014 my note Assessment & Plan:  Epigastric pain Pt declines PPI and wishes to continue with Aloe Vera and papaya Will check H pylori And lipase today I gave copy of Ct to pt and counseled it is very important to follow up with GI as she will likely need an endoscopy and /or ERCP  HTN: She tells me she does take her lisinopril and amlodipine Continue meds  Diated CBD with antral thickinening Will refer to [place] And stressed importance of going to appt.   Addendum: Received a phone call from lab stating serum H.Pylori test is no longer available. Pt will have to have GI do a breath test or do a stool sample. I have canceled that order for the serum test and notified Dr. Coralyn Mark. -MH       TODAY:  [name] is here for CPE  HM:  She declines any screening procedures,  Declines mm,   Colonoscopy, vaccines  She is a non-smoker  Hyperlipidemia  She is taking "alternative meds"  See GI eval above   No Known Allergies Past Medical History  Diagnosis Date  . Arthritis   . Hypertension   . Osteopenia   . Hyperlipidemia   . Lumbar herniated disc   . Sciatica   . Spinal stenosis   . Osteoporosis    Past Surgical History  Procedure Laterality Date  . Fracture surgery Right     wrist, plate inserted  . Lower back surgery  2012    Hernia disk   History   Social History  . Marital Status: Single    Spouse Name: N/A  . Number of Children: 0  . Years of Education: N/A   Occupational History  . Retired    Social History Main Topics  . Smoking status: Never Smoker   . Smokeless tobacco: Never Used  . Alcohol Use: 0.0 - 0.5 oz/week    0-1 drink(s) per week  . Drug Use: No  . Sexual Activity: No   Other Topics Concern  . Not on file   Social History Narrative   Family History  Problem Relation Age of Onset  . Dementia Mother   . Addison's disease Sister   . Arthritis Sister   . Heart disease Father   . Colon cancer Neg Hx   . Colon polyps Neg  Hx   . Kidney disease Neg Hx   . Diabetes Neg Hx   . Esophageal cancer Neg Hx   . Gallbladder disease Neg Hx    Patient Active Problem List   Diagnosis Date Noted  . Abnormal CT of the abdomen  see 03/2014 CT  CBD dilation referred to GI 05/05/2014  . Osteoporosis  Repeatedly refuses Rx meds or injectables.  See [facility] reports 12/03/2013  . Vitamin D deficiency 10/22/2013  . HTN (hypertension) 10/22/2013  . Hyperlipidemia  Repeatedly refuses  RX meds   10/22/2013  . Spinal stenosis of lumbar region 10/22/2013  . Sciatica 10/22/2013   Current Outpatient Prescriptions on File Prior to Visit  Medication Sig Dispense Refill  . amLODipine (NORVASC) 5 MG tablet Take 1/2 tab daily (Patient taking differently: Take 1 tab daily) 90 tablet 1  . Bilberry 1000 MG CAPS Take 1,000 mg by mouth daily.    Marland Kitchen co-enzyme  Q-10 30 MG capsule Take 200 mg by mouth daily.    Marland Kitchen lisinopril (PRINIVIL,ZESTRIL) 20 MG tablet TAKE 1 AND 1/2 TABLETS BY MOUTH EVERY DAY 135 tablet 1  . magnesium gluconate (MAGONATE) 500 MG tablet Take 500 mg by mouth daily.    . Multiple Vitamin (MULTIVITAMIN) capsule Take 1 capsule by mouth daily.    . multivitamin-lutein (OCUVITE-LUTEIN) CAPS capsule Take 1 capsule by mouth daily.    . Plant Sterols and Stanols (CHOLEST OFF PO) Take by mouth.     No current facility-administered medications on file prior to visit.        Review of Systems  Respiratory: Negative for cough, chest tightness, shortness of breath and wheezing.   Cardiovascular: Negative for chest pain, palpitations and leg swelling.       Objective:   Physical Exam Physical Exam  Nursing note and vitals reviewed.  Constitutional: She is oriented to person, place, and time. She appears well-developed and well-nourished.  HENT:  Head: Normocephalic and atraumatic.  Right Ear: Tympanic membrane and ear canal normal. No drainage. Tympanic membrane is not injected and not erythematous.  Left Ear: Tympanic membrane and ear canal normal. No drainage. Tympanic membrane is not injected and not erythematous.  Nose: Nose normal. Right sinus exhibits no maxillary sinus tenderness and no frontal sinus tenderness. Left sinus exhibits no maxillary sinus tenderness and no frontal sinus tenderness.  Mouth/Throat: Oropharynx is clear and moist. No oral lesions. No oropharyngeal exudate.  Eyes: Conjunctivae and EOM are normal. Pupils are equal, round, and reactive to light.  Neck: Normal range of motion. Neck supple. No JVD present. Carotid bruit is not present. No mass and no thyromegaly present.  Cardiovascular: Normal rate, regular rhythm, S1 normal, S2 normal and intact distal pulses. Exam reveals no gallop and no friction rub.  No murmur heard.  Pulses:  Carotid pulses are 2+ on the right side, and 2+ on the left side.    Dorsalis pedis pulses are 2+ on the right side, and 2+ on the left side.  No carotid bruit. No LE edema  Pulmonary/Chest: Breath sounds normal. She has no wheezes. She has no rales. She exhibits no tenderness.  Abdominal: Soft. Bowel sounds are normal. She exhibits no distension and no mass. There is no hepatosplenomegaly. There is no tenderness. There is no CVA tenderness.   Declines rectal and hemocult Musculoskeletal: Normal range of motion.  No active synovitis to joints.  Lymphadenopathy:  She has no cervical adenopathy.  She has no axillary adenopathy.  Right: No inguinal and  no supraclavicular adenopathy present.  Left: No inguinal and no supraclavicular adenopathy present.  Neurological: She is alert and oriented to person, place, and time. She has normal strength and normal reflexes. She displays no tremor. No cranial nerve deficit or sensory deficit. Coordination and gait normal.  Skin: Skin is warm and dry. No rash noted. No cyanosis. Nails show no clubbing.  Psychiatric: She has a normal mood and affect. Her speech is normal and behavior is normal. Cognition and memory are normal.           Assessment & Plan:  HM  Declines mm,  Dexa, colonoscopy and upper endoscopy.  She is a non-smoker   HTN  There is an element of white coat syndrome.  Continue norvasc and amlodipine  Hyperlipidemia  She is on several alternative regimens  I warned of potential adverse effects of her kidneys and liver on alternative supplements .  Will check labs today   Osteoporosis  Declines any  Prescribed therapies.  She does not want any further imagine  Elevated lipase,  Declines further testing or evaluation for this  Abnormal ABd CT with gastric antral thickening and chronic dilated pancreatic duct.  Pt given copy of Ct results and continues to decline further evaluation despite counsel of possibly missing a Scientist, water quality

## 2014-07-01 ENCOUNTER — Encounter: Payer: Self-pay | Admitting: *Deleted

## 2014-07-03 ENCOUNTER — Encounter: Payer: Self-pay | Admitting: Internal Medicine

## 2014-07-07 ENCOUNTER — Emergency Department (HOSPITAL_BASED_OUTPATIENT_CLINIC_OR_DEPARTMENT_OTHER)
Admission: EM | Admit: 2014-07-07 | Discharge: 2014-07-07 | Disposition: A | Discharge status: Home / Self Care | Payer: Medicare Other | Attending: Emergency Medicine | Admitting: Emergency Medicine

## 2014-07-07 ENCOUNTER — Emergency Department (HOSPITAL_BASED_OUTPATIENT_CLINIC_OR_DEPARTMENT_OTHER): Payer: Medicare Other

## 2014-07-07 ENCOUNTER — Encounter (HOSPITAL_BASED_OUTPATIENT_CLINIC_OR_DEPARTMENT_OTHER): Payer: Self-pay | Admitting: *Deleted

## 2014-07-07 DIAGNOSIS — Y9389 Activity, other specified: Secondary | ICD-10-CM | POA: Diagnosis not present

## 2014-07-07 DIAGNOSIS — Z8639 Personal history of other endocrine, nutritional and metabolic disease: Secondary | ICD-10-CM | POA: Diagnosis not present

## 2014-07-07 DIAGNOSIS — W108XXA Fall (on) (from) other stairs and steps, initial encounter: Secondary | ICD-10-CM | POA: Insufficient documentation

## 2014-07-07 DIAGNOSIS — Y998 Other external cause status: Secondary | ICD-10-CM | POA: Insufficient documentation

## 2014-07-07 DIAGNOSIS — S52502A Unspecified fracture of the lower end of left radius, initial encounter for closed fracture: Secondary | ICD-10-CM | POA: Diagnosis not present

## 2014-07-07 DIAGNOSIS — I10 Essential (primary) hypertension: Secondary | ICD-10-CM | POA: Insufficient documentation

## 2014-07-07 DIAGNOSIS — Y92009 Unspecified place in unspecified non-institutional (private) residence as the place of occurrence of the external cause: Secondary | ICD-10-CM | POA: Diagnosis not present

## 2014-07-07 DIAGNOSIS — S6992XA Unspecified injury of left wrist, hand and finger(s), initial encounter: Secondary | ICD-10-CM | POA: Diagnosis present

## 2014-07-07 DIAGNOSIS — S52512A Displaced fracture of left radial styloid process, initial encounter for closed fracture: Secondary | ICD-10-CM | POA: Diagnosis not present

## 2014-07-07 DIAGNOSIS — Z8739 Personal history of other diseases of the musculoskeletal system and connective tissue: Secondary | ICD-10-CM | POA: Diagnosis not present

## 2014-07-07 DIAGNOSIS — Z79899 Other long term (current) drug therapy: Secondary | ICD-10-CM | POA: Insufficient documentation

## 2014-07-07 DIAGNOSIS — S52612A Displaced fracture of left ulna styloid process, initial encounter for closed fracture: Secondary | ICD-10-CM | POA: Diagnosis not present

## 2014-07-07 DIAGNOSIS — S82001A Unspecified fracture of right patella, initial encounter for closed fracture: Secondary | ICD-10-CM | POA: Insufficient documentation

## 2014-07-07 DIAGNOSIS — S0181XA Laceration without foreign body of other part of head, initial encounter: Secondary | ICD-10-CM | POA: Diagnosis not present

## 2014-07-07 MED ORDER — TRAMADOL HCL 50 MG PO TABS: 50.0000 mg | ORAL_TABLET | Freq: Four times a day (QID) | ORAL | Status: DC | PRN

## 2014-07-07 MED ORDER — LIDOCAINE-EPINEPHRINE 2 %-1:100000 IJ SOLN
20.0000 mL | Freq: Once | INTRAMUSCULAR | Status: AC
  Administered 2014-07-07: 20 mL
  Filled 2014-07-07: qty 1

## 2014-07-07 NOTE — ED Provider Notes (Addendum)
CSN: [id number]     Arrival date & time 07/07/14  1638 History   First MD Initiated Contact with Patient 07/07/14 1644     Chief Complaint  Patient presents with  . Fall     (Consider location/radiation/quality/duration/timing/severity/associated sxs/prior Treatment) HPI Comments: Patient presents to the ER for evaluation after a fall. Patient reports that she has a single step leading out of her house. She slipped off the step and hit her head on the storm door, then fell to the ground, landing on her right knee and left wrist. Patient denies loss of consciousness. No significant headache. No neck or back pain. She does not have chest pain or shortness of breath. Patient is complaining of pain and swelling of the left wrist which is moderate to severe and worsens with movement. She also has pain in the right knee. She reports that she has had ongoing problems with arthritis in the right knee, but the pain is worse since the fall.  Patient is a 81 y.o. female presenting with fall.  Fall    Past Medical History  Diagnosis Date  . Arthritis   . Hypertension   . Osteopenia   . Hyperlipidemia   . Lumbar herniated disc   . Sciatica   . Spinal stenosis   . Osteoporosis    Past Surgical History  Procedure Laterality Date  . Fracture surgery Right     wrist, plate inserted  . Lower back surgery  2012    Hernia disk   Family History  Problem Relation Age of Onset  . Dementia Mother   . Addison's disease Sister   . Arthritis Sister   . Heart disease Father   . Colon cancer Neg Hx   . Colon polyps Neg Hx   . Kidney disease Neg Hx   . Diabetes Neg Hx   . Esophageal cancer Neg Hx   . Gallbladder disease Neg Hx    History  Substance Use Topics  . Smoking status: Never Smoker   . Smokeless tobacco: Never Used  . Alcohol Use: 0.0 - 0.5 oz/week    0-1 drink(s) per week   OB History    No data available     Review of Systems  Musculoskeletal: Positive for arthralgias.   Skin: Positive for wound.  All other systems reviewed and are negative.     Allergies  Review of patient's allergies indicates no known allergies.  Home Medications   Prior to Admission medications   Medication Sig Start Date End Date Taking? Authorizing Provider  amLODipine (NORVASC) 5 MG tablet Take 1 tab daily 06/30/14   Lanice Shirts, MD  Bilberry 1000 MG CAPS Take 1,000 mg by mouth daily.    Historical Provider, MD  Biotin 5 MG CAPS Take by mouth.    Historical Provider, MD  co-enzyme Q-10 30 MG capsule Take 200 mg by mouth daily.    Historical Provider, MD  lisinopril (PRINIVIL,ZESTRIL) 20 MG tablet Take 1.5 tablets (30 mg total) by mouth daily. 06/30/14   Lanice Shirts, MD  Multiple Vitamin (MULTIVITAMIN) capsule Take 1 capsule by mouth daily.    Historical Provider, MD  multivitamin-lutein (OCUVITE-LUTEIN) CAPS capsule Take 1 capsule by mouth daily.    Historical Provider, MD  Plant Sterols and Stanols (CHOLEST OFF PO) Take by mouth.    Historical Provider, MD   BP 186/95 mmHg  Pulse 88  Temp(Src) 97.9 F (36.6 C) (Oral)  Resp 20  Ht 5\' 1"  (1.549 m)  Wt 135 lb (61.236 kg)  BMI 25.52 kg/m2  SpO2 98% Physical Exam  Constitutional: She is oriented to person, place, and time. She appears well-developed and well-nourished. No distress.  HENT:  Head: Normocephalic. Head is with laceration (superficial, forehead).  Right Ear: Hearing normal.  Left Ear: Hearing normal.  Nose: Sinus tenderness and nasal septal hematoma present. No nasal deformity or septal deviation.    Mouth/Throat: Oropharynx is clear and moist and mucous membranes are normal.  Eyes: Conjunctivae and EOM are normal. Pupils are equal, round, and reactive to light.  Neck: Normal range of motion. Neck supple. No spinous process tenderness and no muscular tenderness present.  Cardiovascular: Regular rhythm, S1 normal and S2 normal.  Exam reveals no gallop and no friction rub.   No murmur  heard. Pulmonary/Chest: Effort normal and breath sounds normal. No respiratory distress. She exhibits no tenderness.  Abdominal: Soft. Normal appearance and bowel sounds are normal. There is no hepatosplenomegaly. There is no tenderness. There is no rebound, no guarding, no tenderness at McBurney's point and negative Murphy's sign. No hernia.  Musculoskeletal: Normal range of motion.       Left shoulder: Normal.       Left elbow: Normal.       Left wrist: She exhibits tenderness and swelling.       Right knee: She exhibits swelling and effusion. She exhibits no deformity. Tenderness found.       Thoracic back: Normal.       Lumbar back: Normal.  Neurological: She is alert and oriented to person, place, and time. She has normal strength. No cranial nerve deficit or sensory deficit. Coordination normal. GCS eye subscore is 4. GCS verbal subscore is 5. GCS motor subscore is 6.  Skin: Skin is warm, dry and intact. No rash noted. No cyanosis.  Psychiatric: She has a normal mood and affect. Her speech is normal and behavior is normal. Thought content normal.  Nursing note and vitals reviewed.   ED Course  Procedures (including critical care time)  LACERATION REPAIR Performed by: Orpah Greek. Authorized by: Orpah Greek Consent: Verbal consent obtained. Risks and benefits: risks, benefits and alternatives were discussed Consent given by: patient Patient identity confirmed: provided demographic data Prepped and Draped in normal sterile fashion Wound explored  Laceration Location: forehead  Laceration Length: 0.5cm  No Foreign Bodies seen or palpated  Anesthesia: local infiltration  Local anesthetic: lidocaine 2% without epinephrine  Anesthetic total: 1 ml  Irrigation method: syringe Amount of cleaning: standard  Skin closure: skin glue  Patient tolerance: Patient tolerated the procedure well with no immediate complications.   Labs Review Labs Reviewed -  No data to display  Imaging Review Dg Wrist Complete Left  07/07/2014   CLINICAL DATA:  Left wrist pain, fall today. Pain, swelling, bruising.  EXAM: LEFT WRIST - COMPLETE 3+ VIEW  COMPARISON:  None.  FINDINGS: There is a fracture through the distal left radius. Minimal displacement and posterior angulation. Overlying soft tissue swelling. Ulnar styloid fracture noted.  IMPRESSION: Distal left radial and ulnar styloid fractures.   Electronically Signed   By: Rolm Baptise M.D.   On: 07/07/2014 17:40   Dg Knee Complete 4 Views Right  07/07/2014   CLINICAL DATA:  Right knee pain post fall today.  Swelling.  EXAM: RIGHT KNEE - COMPLETE 4+ VIEW  COMPARISON:  None.  FINDINGS: Degenerative changes with joint space narrowing and spurring in all 3 compartments, most pronounced in the lateral and  patellofemoral compartments. There is a small to moderate joint effusion. Lucency within the patella compatible with nondisplaced patellar fracture. No additional acute bony abnormality.  IMPRESSION: Nondisplaced right patellar fracture.  Moderate joint effusion.  Mild degenerative changes.   Electronically Signed   By: Rolm Baptise M.D.   On: 07/07/2014 17:42     EKG Interpretation None      MDM   Final diagnoses:  Radius distal fracture, left, closed, initial encounter  Patella fracture, right, closed, initial encounter    Patient presents to the ER for evaluation after a fall. Patient tripped going down a step and hit her head on a storm door and then fell to the ground. She fell onto her right knee and left arm. She is complaining of pain and swelling of both areas. X-ray of the wrist shows distal radius fracture of the left styloid fracture. This is nondisplaced. X-ray of the knee also shows nondisplaced patella fracture. She placed in sugar tong splint and knee immobilizer.  Patient did not have any loss of consciousness. She does not have any headache. I do not feel she needs a CT scan of her head.  There is some swelling of the bridge of her nose, but no crepitance. No septal hematoma. There appeared to be linear laceration of the forehead, but after this was numbed with lidocaine and examined closer, it was discovered these were too small adjacent lacerations. The closure was therefore performed with skin glue.  Patient has seen Dr. Burney Gauze for previous right wrist fracture. She'll be referred back to Dr. Burney Gauze. She will be referred to unassigned ortho, Dr. Marcelino Scot for treatment of patella fracture.  Patient has declined admission. She feels comfortable being able to get around and has enough help at home did not need admission and/or rehabilitation at this time.    Orpah Greek, MD 07/07/14 [place], MD 07/07/14 Valerie Roys

## 2014-07-07 NOTE — ED Notes (Signed)
Tripped on the steps and fell. Hematoma to her left wrist, abrasion to her forehead and nose. No LOC. Right knee pain.

## 2014-07-07 NOTE — Discharge Instructions (Signed)
Patellar Fracture, Adult A patellar fracture is a break in your kneecap (patella).  CAUSES   A direct blow to the knee or a fall is usually the cause of a broken patella.  A very hard and strong bending of your knee can cause a patellar fracture. RISK FACTORS Involvement in contact sports, especially sports that involve a lot of jumping. SIGNS AND SYMPTOMS   Tender and swollen knee.  Pain when you move your knee, especially when you try to straighten out your leg.  Difficulty walking or putting weight on your knee.  Misshapen knee (as if a bone is out of place). DIAGNOSIS  Patellar fracture is usually diagnosed with a physical exam and an X-ray exam. TREATMENT  Treatment depends on the type of fracture:  If your patella is still in the right position after the fracture and you can still straighten your leg out, you can usually be treated with a splint or cast for 4-6 weeks.  If your patella is broken into multiple small pieces but you are able to straighten your leg, you can usually be treated with a splint or cast for 4-6 weeks. Sometimes your patella may need to be removed before the cast is applied.  If you cannot straighten out your leg after a patellar fracture, then surgery is required to hold the bony fragments together until they heal. A cast or splint will be applied for 4-6 weeks. HOME CARE INSTRUCTIONS   Only take over-the-counter or prescription medicines for pain, discomfort, or fever as directed by your health care provider.  Use crutches as directed, and exercise the leg as directed.  Apply ice to the injured area:  Put ice in a plastic bag.  Place a towel between your skin and the bag.  Leave the ice on for 20 minutes, 2-3 times a day.  Elevate the affected knee above the level of your heart. SEEK MEDICAL CARE IF:  You suspect you have significantly injured your knee.  You hear a pop after a knee injury.  Your knee is misshapen after a knee  injury.  You have pain when you move your knee.  You have difficulty walking or putting weight on your knee.  You cannot fully move your knee. SEEK IMMEDIATE MEDICAL CARE IF:  You have redness, swelling, or increasing pain in your knee.  You have a fever. Document Released: 12/10/2002 Document Revised: 01/01/2013 Document Reviewed: 10/23/2012 [facility] Patient Information 2015 [place], [place]. This information is not intended to replace advice given to you by your health care provider. Make sure you discuss any questions you have with your health care provider.  Radial Fracture You have a broken bone (fracture) of the forearm. This is the part of your arm between the elbow and your wrist. Your forearm is made up of two bones. These are the radius and ulna. Your fracture is in the radial shaft. This is the bone in your forearm located on the thumb side. A cast or splint is used to protect and keep your injured bone from moving. The cast or splint will be on generally for about 5 to 6 weeks, with individual variations. HOME CARE INSTRUCTIONS   Keep the injured part elevated while sitting or lying down. Keep the injury above the level of your heart (the center of the chest). This will decrease swelling and pain.  Apply ice to the injury for 15-20 minutes, 03-04 times per day while awake, for 2 days. Put the ice in a plastic bag  and place a towel between the bag of ice and your cast or splint.  Move your fingers to avoid stiffness and minimize swelling.  If you have a plaster or fiberglass cast:  Do not try to scratch the skin under the cast using sharp or pointed objects.  Check the skin around the cast every day. You may put lotion on any red or sore areas.  Keep your cast dry and clean.  If you have a plaster splint:  Wear the splint as directed.  You may loosen the elastic around the splint if your fingers become numb, tingle, or turn cold or blue.  Do not put pressure on any  part of your cast or splint. It may break. Rest your cast only on a pillow for the first 24 hours until it is fully hardened.  Your cast or splint can be protected during bathing with a plastic bag. Do not lower the cast or splint into water.  Only take over-the-counter or prescription medicines for pain, discomfort, or fever as directed by your caregiver. SEEK IMMEDIATE MEDICAL CARE IF:   Your cast gets damaged or breaks.  You have more severe pain or swelling than you did before getting the cast.  You have severe pain when stretching your fingers.  There is a bad smell, new stains and/or pus-like (purulent) drainage coming from under the cast.  Your fingers or hand turn pale or blue and become cold or your loose feeling. Document Released: 08/24/2005 Document Revised: 06/05/2011 Document Reviewed: 11/20/2005 [facility] Patient Information 2015 [place], [place]. This information is not intended to replace advice given to you by your health care provider. Make sure you discuss any questions you have with your health care provider.

## 2014-07-08 DIAGNOSIS — S82001A Unspecified fracture of right patella, initial encounter for closed fracture: Secondary | ICD-10-CM | POA: Diagnosis not present

## 2014-07-13 DIAGNOSIS — S52532A Colles' fracture of left radius, initial encounter for closed fracture: Secondary | ICD-10-CM | POA: Diagnosis not present

## 2014-07-13 DIAGNOSIS — S52532D Colles' fracture of left radius, subsequent encounter for closed fracture with routine healing: Secondary | ICD-10-CM | POA: Diagnosis not present

## 2014-07-22 DIAGNOSIS — S82001D Unspecified fracture of right patella, subsequent encounter for closed fracture with routine healing: Secondary | ICD-10-CM | POA: Diagnosis not present

## 2014-07-23 DIAGNOSIS — S52532D Colles' fracture of left radius, subsequent encounter for closed fracture with routine healing: Secondary | ICD-10-CM | POA: Diagnosis not present

## 2014-07-25 DIAGNOSIS — Z4789 Encounter for other orthopedic aftercare: Secondary | ICD-10-CM | POA: Diagnosis not present

## 2014-07-25 DIAGNOSIS — S52502A Unspecified fracture of the lower end of left radius, initial encounter for closed fracture: Secondary | ICD-10-CM | POA: Diagnosis not present

## 2014-07-25 DIAGNOSIS — I10 Essential (primary) hypertension: Secondary | ICD-10-CM | POA: Diagnosis not present

## 2014-07-25 DIAGNOSIS — S82001A Unspecified fracture of right patella, initial encounter for closed fracture: Secondary | ICD-10-CM | POA: Diagnosis not present

## 2014-07-27 DIAGNOSIS — S52502A Unspecified fracture of the lower end of left radius, initial encounter for closed fracture: Secondary | ICD-10-CM | POA: Diagnosis not present

## 2014-07-27 DIAGNOSIS — S82001A Unspecified fracture of right patella, initial encounter for closed fracture: Secondary | ICD-10-CM | POA: Diagnosis not present

## 2014-07-27 DIAGNOSIS — I10 Essential (primary) hypertension: Secondary | ICD-10-CM | POA: Diagnosis not present

## 2014-07-27 DIAGNOSIS — Z4789 Encounter for other orthopedic aftercare: Secondary | ICD-10-CM | POA: Diagnosis not present

## 2014-07-28 DIAGNOSIS — S82001A Unspecified fracture of right patella, initial encounter for closed fracture: Secondary | ICD-10-CM | POA: Diagnosis not present

## 2014-07-28 DIAGNOSIS — I10 Essential (primary) hypertension: Secondary | ICD-10-CM | POA: Diagnosis not present

## 2014-07-28 DIAGNOSIS — Z4789 Encounter for other orthopedic aftercare: Secondary | ICD-10-CM | POA: Diagnosis not present

## 2014-07-28 DIAGNOSIS — S52502A Unspecified fracture of the lower end of left radius, initial encounter for closed fracture: Secondary | ICD-10-CM | POA: Diagnosis not present

## 2014-07-29 DIAGNOSIS — I10 Essential (primary) hypertension: Secondary | ICD-10-CM | POA: Diagnosis not present

## 2014-07-29 DIAGNOSIS — Z4789 Encounter for other orthopedic aftercare: Secondary | ICD-10-CM | POA: Diagnosis not present

## 2014-07-29 DIAGNOSIS — S82001A Unspecified fracture of right patella, initial encounter for closed fracture: Secondary | ICD-10-CM | POA: Diagnosis not present

## 2014-07-29 DIAGNOSIS — S52502A Unspecified fracture of the lower end of left radius, initial encounter for closed fracture: Secondary | ICD-10-CM | POA: Diagnosis not present

## 2014-08-03 ENCOUNTER — Encounter: Payer: Self-pay | Admitting: Internal Medicine

## 2014-08-03 DIAGNOSIS — I10 Essential (primary) hypertension: Secondary | ICD-10-CM | POA: Diagnosis not present

## 2014-08-03 DIAGNOSIS — Z4789 Encounter for other orthopedic aftercare: Secondary | ICD-10-CM | POA: Diagnosis not present

## 2014-08-03 DIAGNOSIS — S52502A Unspecified fracture of the lower end of left radius, initial encounter for closed fracture: Secondary | ICD-10-CM | POA: Diagnosis not present

## 2014-08-03 DIAGNOSIS — S82001A Unspecified fracture of right patella, initial encounter for closed fracture: Secondary | ICD-10-CM | POA: Diagnosis not present

## 2014-08-04 ENCOUNTER — Encounter: Payer: Self-pay | Admitting: *Deleted

## 2014-08-04 DIAGNOSIS — S52532D Colles' fracture of left radius, subsequent encounter for closed fracture with routine healing: Secondary | ICD-10-CM | POA: Diagnosis not present

## 2014-08-05 DIAGNOSIS — S82001A Unspecified fracture of right patella, initial encounter for closed fracture: Secondary | ICD-10-CM | POA: Diagnosis not present

## 2014-08-05 DIAGNOSIS — S52502A Unspecified fracture of the lower end of left radius, initial encounter for closed fracture: Secondary | ICD-10-CM | POA: Diagnosis not present

## 2014-08-05 DIAGNOSIS — Z4789 Encounter for other orthopedic aftercare: Secondary | ICD-10-CM | POA: Diagnosis not present

## 2014-08-05 DIAGNOSIS — S82001D Unspecified fracture of right patella, subsequent encounter for closed fracture with routine healing: Secondary | ICD-10-CM | POA: Diagnosis not present

## 2014-08-05 DIAGNOSIS — I10 Essential (primary) hypertension: Secondary | ICD-10-CM | POA: Diagnosis not present

## 2014-08-06 ENCOUNTER — Encounter: Payer: Self-pay | Admitting: *Deleted

## 2014-08-11 DIAGNOSIS — S82001A Unspecified fracture of right patella, initial encounter for closed fracture: Secondary | ICD-10-CM | POA: Diagnosis not present

## 2014-08-11 DIAGNOSIS — Z4789 Encounter for other orthopedic aftercare: Secondary | ICD-10-CM | POA: Diagnosis not present

## 2014-08-11 DIAGNOSIS — I10 Essential (primary) hypertension: Secondary | ICD-10-CM | POA: Diagnosis not present

## 2014-08-11 DIAGNOSIS — S52502A Unspecified fracture of the lower end of left radius, initial encounter for closed fracture: Secondary | ICD-10-CM | POA: Diagnosis not present

## 2014-08-13 DIAGNOSIS — I10 Essential (primary) hypertension: Secondary | ICD-10-CM | POA: Diagnosis not present

## 2014-08-13 DIAGNOSIS — Z4789 Encounter for other orthopedic aftercare: Secondary | ICD-10-CM | POA: Diagnosis not present

## 2014-08-13 DIAGNOSIS — S52502A Unspecified fracture of the lower end of left radius, initial encounter for closed fracture: Secondary | ICD-10-CM | POA: Diagnosis not present

## 2014-08-13 DIAGNOSIS — S82001A Unspecified fracture of right patella, initial encounter for closed fracture: Secondary | ICD-10-CM | POA: Diagnosis not present

## 2014-08-18 DIAGNOSIS — Z4789 Encounter for other orthopedic aftercare: Secondary | ICD-10-CM | POA: Diagnosis not present

## 2014-08-18 DIAGNOSIS — S52502A Unspecified fracture of the lower end of left radius, initial encounter for closed fracture: Secondary | ICD-10-CM | POA: Diagnosis not present

## 2014-08-18 DIAGNOSIS — I10 Essential (primary) hypertension: Secondary | ICD-10-CM | POA: Diagnosis not present

## 2014-08-18 DIAGNOSIS — S82001A Unspecified fracture of right patella, initial encounter for closed fracture: Secondary | ICD-10-CM | POA: Diagnosis not present

## 2014-08-20 DIAGNOSIS — Z4789 Encounter for other orthopedic aftercare: Secondary | ICD-10-CM | POA: Diagnosis not present

## 2014-08-20 DIAGNOSIS — S82001A Unspecified fracture of right patella, initial encounter for closed fracture: Secondary | ICD-10-CM | POA: Diagnosis not present

## 2014-08-20 DIAGNOSIS — S52532D Colles' fracture of left radius, subsequent encounter for closed fracture with routine healing: Secondary | ICD-10-CM | POA: Diagnosis not present

## 2014-08-20 DIAGNOSIS — S52502A Unspecified fracture of the lower end of left radius, initial encounter for closed fracture: Secondary | ICD-10-CM | POA: Diagnosis not present

## 2014-08-20 DIAGNOSIS — I10 Essential (primary) hypertension: Secondary | ICD-10-CM | POA: Diagnosis not present

## 2014-08-31 DIAGNOSIS — E559 Vitamin D deficiency, unspecified: Secondary | ICD-10-CM | POA: Diagnosis not present

## 2014-08-31 DIAGNOSIS — I10 Essential (primary) hypertension: Secondary | ICD-10-CM | POA: Diagnosis not present

## 2014-08-31 DIAGNOSIS — M4807 Spinal stenosis, lumbosacral region: Secondary | ICD-10-CM | POA: Diagnosis not present

## 2014-09-01 ENCOUNTER — Ambulatory Visit: Payer: Self-pay | Admitting: Internal Medicine

## 2014-09-02 DIAGNOSIS — S82001D Unspecified fracture of right patella, subsequent encounter for closed fracture with routine healing: Secondary | ICD-10-CM | POA: Diagnosis not present

## 2014-09-21 ENCOUNTER — Other Ambulatory Visit: Payer: Self-pay

## 2017-03-30 ENCOUNTER — Telehealth: Payer: Self-pay

## 2017-03-30 NOTE — Telephone Encounter (Signed)
Left a voicemail for Tracy Rogers in regards to the referral for the PREP here at the [facility] and for her to call back when she is able.

## 2017-04-06 DIAGNOSIS — E559 Vitamin D deficiency, unspecified: Secondary | ICD-10-CM | POA: Diagnosis not present

## 2017-04-06 DIAGNOSIS — I10 Essential (primary) hypertension: Secondary | ICD-10-CM | POA: Diagnosis not present

## 2017-04-06 DIAGNOSIS — E782 Mixed hyperlipidemia: Secondary | ICD-10-CM | POA: Diagnosis not present

## 2017-04-06 DIAGNOSIS — M4807 Spinal stenosis, lumbosacral region: Secondary | ICD-10-CM | POA: Diagnosis not present

## 2017-04-06 DIAGNOSIS — Z5181 Encounter for therapeutic drug level monitoring: Secondary | ICD-10-CM | POA: Diagnosis not present

## 2017-04-06 DIAGNOSIS — M810 Age-related osteoporosis without current pathological fracture: Secondary | ICD-10-CM | POA: Diagnosis not present

## 2017-04-06 DIAGNOSIS — G4762 Sleep related leg cramps: Secondary | ICD-10-CM | POA: Diagnosis not present

## 2017-04-06 DIAGNOSIS — R69 Illness, unspecified: Secondary | ICD-10-CM | POA: Diagnosis not present

## 2017-05-11 NOTE — Progress Notes (Signed)
[facility] Report   Patient Details  Name: Tracy Rogers MRN: [id number] Date of Birth: 21-Feb-1931 Age: 84 y.o. PCP: Lanice Shirts, MD  Vitals:   05/11/17 1048  BP: (!) 156/84  Resp: 18  SpO2: 98%  Weight: 127 lb 6.4 oz (57.8 kg)  Height: 5\' 1"  (1.549 m)     [facility] Eval - 05/11/17 1000      Referral    Referring Provider  Dr. Yaakov Guthrie    Reason for referral  High Cholesterol;Hypertension;Inactivity;Orthopedic    Program Start Date  05/11/17      Measurement   Waist Circumference  35.5 inches    Hip Circumference  39 inches    Body fat  46 percent      Information for Trainer   Goals  "Strength, endurance, balance, hypertension, cholesterol"    Current Exercise  "walking-15 to 20 mins/5x a week    Orthopedic Concerns  "Rt knee cap, RT leg feels weaker than LT"    Pertinent Medical History  "HTN, Cholesterol"    Current Barriers  "Lack of discipline"      Timed Up and Go (TUGS)   Timed Up and Go  High risk >13 seconds 13.3secs.  "i can't get up without using my arms to press up   13.3secs.  "i can't get up without using my arms to press up     Mobility and Daily Activities   I find it easy to walk up or down two or more flights of stairs.  1    I have no trouble taking out the trash.  4    I do housework such as vacuuming and dusting on my own without difficulty.  3    I can easily lift a gallon of milk (8lbs).  4    I can easily walk a mile.  1    I have no trouble reaching into high cupboards or reaching down to pick up something from the floor.  4    I do not have trouble doing out-door work such as Armed forces logistics/support/administrative officer, raking leaves, or gardening.  2      Mobility and Daily Activities   I feel younger than my age.  4    I feel independent.  4    I feel energetic.  3    I live an active life.   3    I feel strong.  3    I feel healthy.  3    I feel active as other people my age.  4      How fit and strong are you.   Fit  and Strong Total Score  43      Past Medical History:  Diagnosis Date  . Arthritis   . Hyperlipidemia   . Hypertension   . Lumbar herniated disc   . Osteopenia   . Osteoporosis   . Sciatica   . Spinal stenosis    Past Surgical History:  Procedure Laterality Date  . FRACTURE SURGERY Right    wrist, plate inserted  . 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    Tracy Rogers 05/11/2017, 10:54 AM

## 2017-05-14 DIAGNOSIS — H40013 Open angle with borderline findings, low risk, bilateral: Secondary | ICD-10-CM | POA: Diagnosis not present

## 2017-05-16 NOTE — Progress Notes (Signed)
[facility] PREP Weekly Session   Patient Details  Name: Tracy Rogers MRN: [id number] Date of Birth: 10/18/1930 Age: 84 y.o. PCP: Lanice Shirts, MD  Vitals:   05/16/17 1334  Weight: 128 lb 6.4 oz (58.2 kg)    [facility] Weekly seesion - 05/16/17 1300      Weekly Session   Topic Discussed  Other ways to be active    Minutes exercised this week  45 minutes cardio   cardio   Classes attended to date  1      Fun things you did since last meeting:"out to lunch" Things you are grateful for:"life, friends, pets"  Vanita Ingles 05/16/2017, 1:34 PM

## 2017-05-17 DIAGNOSIS — Z0101 Encounter for examination of eyes and vision with abnormal findings: Secondary | ICD-10-CM | POA: Diagnosis not present

## 2017-05-21 DIAGNOSIS — R69 Illness, unspecified: Secondary | ICD-10-CM | POA: Diagnosis not present

## 2017-05-23 NOTE — Progress Notes (Signed)
[facility] PREP Weekly Session   Patient Details  Name: Tracy Rogers MRN: [id number] Date of Birth: [birth year] Age: 84 y.o. PCP: Lanice Shirts, MD  Vitals:   05/23/17 1309  Weight: 127 lb (57.6 kg)    [facility] Weekly seesion - 05/23/17 1300      Weekly Session   Topic Discussed  Health habits    Minutes exercised this week  180 minutes cardio   cardio   Classes attended to date  2      Things you are grateful for"Relatively good health" Nutrition celebrations:"lots of salad & homemade veg soup" Barriers:"Bp Back in control"   Vanita Ingles 05/23/2017, 1:10 PM

## 2017-06-01 NOTE — Progress Notes (Signed)
[facility] PREP Weekly Session   Patient Details  Name: Tracy Rogers MRN: [id number] Date of Birth: [birth year] Age: 84 y.o. PCP: Lanice Shirts, MD  Vitals:   05/30/17 1401  Weight: 127 lb 6.4 oz (57.8 kg)    [facility] Weekly seesion - 06/01/17 1400      Weekly Session   Topic Discussed  Restaurant Eating    Minutes exercised this week  -- not reported   not reported   Classes attended to date  3      Fun things you did since last meeting:"out to lunch" Things you are grateful for:"life, friends, relatively good health" Nutrition celebrations:"added hybiscus tea-good for BP control" Barriers:"Got HBP under control-after eating ham-Back to 116/72 this am"  Vanita Ingles 06/01/2017, 2:01 PM

## 2017-06-08 NOTE — Progress Notes (Signed)
[facility] PREP Weekly Session   Patient Details  Name: Tracy Rogers MRN: [id number] Date of Birth: [birth year] Age: 84 y.o. PCP: Lanice Shirts, MD  Vitals:   06/08/17 1241  Weight: 124 lb 9.6 oz (56.5 kg)    [facility] sees[facility] - 06/08/17 1200      Weekly Session   Topic Discussed  Stress management and problem solving    Minutes exercised this week  110 minutes 50cardio/30strength/17flexibility   50cardio/30strength/75flexibility   Classes attended to date  4      Fun things you did since last meeting:"attended wedding in [place]" Things you are grateful for:"Life today, daylight savings time" Nutrition celebrations:"hybiscus tea & ACV in water" Barriers:" Keep away from sodium as much as possible"    Vanita Ingles 06/08/2017, 12:42 PM

## 2017-06-18 NOTE — Progress Notes (Signed)
[facility] PREP Weekly Session   Patient Details  Name: Tracy Rogers MRN: [id number] Date of Birth: [birth year] Age: 84 y.o. PCP: Lanice Shirts, MD  There were no vitals filed for this visit.  [facility] Weekly sees[facility] - 06/18/17 0900      Weekly Session   Classes attended to date  Beulah didn't self report this week but did attend class and the group workout.    Vanita Ingles 06/18/2017, 9:52 AM

## 2017-06-25 NOTE — Progress Notes (Signed)
[facility] PREP Weekly Session   Patient Details  Name: Tracy Rogers MRN: [id number] Date of Birth: 23-Feb-1931 Age: 84 y.o. PCP: Lanice Shirts, MD  Vitals:   06/22/17 0955  Weight: 124 lb 3.2 oz (56.3 kg)    [facility] Weekly sees[facility] - 06/25/17 0900      Weekly Session   Topic Discussed  Expectations and non-scale victories    Minutes exercised this week  390 minutes 360cardio/30strength   360cardio/30strength   Classes attended to date  6      Fun things you did since last meeting:"Housework, weeding, etc" Things you are grateful for:"Relitively good health, friends, family" Nutrition celebrations"Frozen yogurt sundae" Barriers:"leg issues"   Vanita Ingles 06/25/2017, 9:56 AM

## 2017-07-06 NOTE — Progress Notes (Signed)
[facility] PREP Weekly Session   Patient Details  Name: Tracy Rogers MRN: [id number] Date of Birth: Sep 09, 1930 Age: 84 y.o. PCP: Lanice Shirts, MD  Vitals:   07/06/17 1308  Weight: 123 lb 6.4 oz (56 kg)    [facility] sees[facility] - 07/06/17 1300      Weekly Session   Topic Discussed  Hitting roadblocks    Minutes exercised this week  300 minutes 120cardio/60strength/120-129flexibility   120cardio/60strength/120-157flexibility   Classes attended to date  7      Fun things you did since last meeting:"attached indoor TV antenna" Things you are grateful for:"everything" Nutrition celebrations:"munchies" Barriers:"none"   Tracy Rogers 07/06/2017, 1:09 PM

## 2017-07-13 NOTE — Progress Notes (Signed)
[facility] PREP Weekly Session   Patient Details  Name: Tracy Rogers MRN: [id number] Date of Birth: [birth year] Age: 84 y.o. PCP: Lanice Shirts, MD  Vitals:   07/13/17 1329  Weight: 124 lb 6.4 oz (56.4 kg)    [facility] Weekly sees[facility] - 07/13/17 1300      Weekly Session   Topic Discussed  Finding support    Minutes exercised this week  60 minutes 60cardio   60cardio   Classes attended to date  8      Fun things you did since last meeting:"Installed indoor antenna" Things you are grateful for:"Life, my 2 cats, friends, [place] food" Nutrition celebrations:"Rita [place]"  Vanita Ingles 07/13/2017, 1:29 PM

## 2017-08-06 DIAGNOSIS — Z7722 Contact with and (suspected) exposure to environmental tobacco smoke (acute) (chronic): Secondary | ICD-10-CM | POA: Diagnosis not present

## 2017-08-06 DIAGNOSIS — M4800 Spinal stenosis, site unspecified: Secondary | ICD-10-CM | POA: Diagnosis not present

## 2017-08-06 DIAGNOSIS — Z880 Allergy status to penicillin: Secondary | ICD-10-CM | POA: Diagnosis not present

## 2017-08-06 DIAGNOSIS — I10 Essential (primary) hypertension: Secondary | ICD-10-CM | POA: Diagnosis not present

## 2017-08-06 DIAGNOSIS — Z888 Allergy status to other drugs, medicaments and biological substances status: Secondary | ICD-10-CM | POA: Diagnosis not present

## 2017-08-06 DIAGNOSIS — R52 Pain, unspecified: Secondary | ICD-10-CM | POA: Diagnosis not present

## 2017-08-06 DIAGNOSIS — Z8249 Family history of ischemic heart disease and other diseases of the circulatory system: Secondary | ICD-10-CM | POA: Diagnosis not present

## 2017-10-02 DIAGNOSIS — E782 Mixed hyperlipidemia: Secondary | ICD-10-CM | POA: Diagnosis not present

## 2017-10-02 DIAGNOSIS — E559 Vitamin D deficiency, unspecified: Secondary | ICD-10-CM | POA: Diagnosis not present

## 2017-10-05 DIAGNOSIS — E559 Vitamin D deficiency, unspecified: Secondary | ICD-10-CM | POA: Diagnosis not present

## 2017-10-05 DIAGNOSIS — I10 Essential (primary) hypertension: Secondary | ICD-10-CM | POA: Diagnosis not present

## 2017-10-05 DIAGNOSIS — E782 Mixed hyperlipidemia: Secondary | ICD-10-CM | POA: Diagnosis not present

## 2017-10-05 DIAGNOSIS — M8580 Other specified disorders of bone density and structure, unspecified site: Secondary | ICD-10-CM | POA: Diagnosis not present

## 2017-10-05 DIAGNOSIS — N183 Chronic kidney disease, stage 3 (moderate): Secondary | ICD-10-CM | POA: Diagnosis not present

## 2017-10-05 DIAGNOSIS — M810 Age-related osteoporosis without current pathological fracture: Secondary | ICD-10-CM | POA: Diagnosis not present

## 2017-11-14 DIAGNOSIS — H40013 Open angle with borderline findings, low risk, bilateral: Secondary | ICD-10-CM | POA: Diagnosis not present

## 2017-11-19 DIAGNOSIS — R69 Illness, unspecified: Secondary | ICD-10-CM | POA: Diagnosis not present

## 2017-11-21 DIAGNOSIS — R69 Illness, unspecified: Secondary | ICD-10-CM | POA: Diagnosis not present

## 2018-02-11 DIAGNOSIS — E782 Mixed hyperlipidemia: Secondary | ICD-10-CM | POA: Diagnosis not present

## 2018-02-11 DIAGNOSIS — M8580 Other specified disorders of bone density and structure, unspecified site: Secondary | ICD-10-CM | POA: Diagnosis not present

## 2018-02-11 DIAGNOSIS — M810 Age-related osteoporosis without current pathological fracture: Secondary | ICD-10-CM | POA: Diagnosis not present

## 2018-02-11 DIAGNOSIS — N183 Chronic kidney disease, stage 3 (moderate): Secondary | ICD-10-CM | POA: Diagnosis not present

## 2018-02-11 DIAGNOSIS — E559 Vitamin D deficiency, unspecified: Secondary | ICD-10-CM | POA: Diagnosis not present

## 2018-02-11 DIAGNOSIS — I10 Essential (primary) hypertension: Secondary | ICD-10-CM | POA: Diagnosis not present

## 2018-05-22 DIAGNOSIS — H35033 Hypertensive retinopathy, bilateral: Secondary | ICD-10-CM | POA: Diagnosis not present

## 2018-05-23 DIAGNOSIS — R69 Illness, unspecified: Secondary | ICD-10-CM | POA: Diagnosis not present

## 2018-08-20 DIAGNOSIS — M810 Age-related osteoporosis without current pathological fracture: Secondary | ICD-10-CM | POA: Diagnosis not present

## 2018-08-20 DIAGNOSIS — E559 Vitamin D deficiency, unspecified: Secondary | ICD-10-CM | POA: Diagnosis not present

## 2018-08-20 DIAGNOSIS — M8580 Other specified disorders of bone density and structure, unspecified site: Secondary | ICD-10-CM | POA: Diagnosis not present

## 2018-08-20 DIAGNOSIS — E782 Mixed hyperlipidemia: Secondary | ICD-10-CM | POA: Diagnosis not present

## 2018-08-20 DIAGNOSIS — I10 Essential (primary) hypertension: Secondary | ICD-10-CM | POA: Diagnosis not present

## 2018-08-20 DIAGNOSIS — N183 Chronic kidney disease, stage 3 (moderate): Secondary | ICD-10-CM | POA: Diagnosis not present

## 2018-08-22 DIAGNOSIS — M8580 Other specified disorders of bone density and structure, unspecified site: Secondary | ICD-10-CM | POA: Diagnosis not present

## 2018-08-22 DIAGNOSIS — E782 Mixed hyperlipidemia: Secondary | ICD-10-CM | POA: Diagnosis not present

## 2018-08-22 DIAGNOSIS — E559 Vitamin D deficiency, unspecified: Secondary | ICD-10-CM | POA: Diagnosis not present

## 2018-08-22 DIAGNOSIS — I10 Essential (primary) hypertension: Secondary | ICD-10-CM | POA: Diagnosis not present

## 2018-08-22 DIAGNOSIS — M810 Age-related osteoporosis without current pathological fracture: Secondary | ICD-10-CM | POA: Diagnosis not present

## 2018-08-22 DIAGNOSIS — N183 Chronic kidney disease, stage 3 (moderate): Secondary | ICD-10-CM | POA: Diagnosis not present

## 2018-08-27 DIAGNOSIS — D225 Melanocytic nevi of trunk: Secondary | ICD-10-CM | POA: Diagnosis not present

## 2018-08-27 DIAGNOSIS — D2261 Melanocytic nevi of right upper limb, including shoulder: Secondary | ICD-10-CM | POA: Diagnosis not present

## 2018-08-27 DIAGNOSIS — C44311 Basal cell carcinoma of skin of nose: Secondary | ICD-10-CM | POA: Diagnosis not present

## 2018-08-27 DIAGNOSIS — L570 Actinic keratosis: Secondary | ICD-10-CM | POA: Diagnosis not present

## 2018-10-24 ENCOUNTER — Other Ambulatory Visit: Payer: Self-pay

## 2018-10-24 ENCOUNTER — Inpatient Hospital Stay (HOSPITAL_BASED_OUTPATIENT_CLINIC_OR_DEPARTMENT_OTHER)
Admission: EM | Admit: 2018-10-24 | Discharge: 2018-10-26 | DRG: 247 | Disposition: A | Discharge status: Home Health | Payer: Medicare HMO | Attending: Cardiology | Admitting: Cardiology

## 2018-10-24 ENCOUNTER — Encounter (HOSPITAL_BASED_OUTPATIENT_CLINIC_OR_DEPARTMENT_OTHER): Payer: Self-pay | Admitting: Emergency Medicine

## 2018-10-24 ENCOUNTER — Emergency Department (HOSPITAL_BASED_OUTPATIENT_CLINIC_OR_DEPARTMENT_OTHER): Payer: Medicare HMO

## 2018-10-24 ENCOUNTER — Encounter (HOSPITAL_COMMUNITY)
Admission: EM | Disposition: A | Discharge status: Home Health | Payer: Self-pay | Source: Home / Self Care | Attending: Cardiology

## 2018-10-24 DIAGNOSIS — N183 Chronic kidney disease, stage 3 (moderate): Secondary | ICD-10-CM | POA: Diagnosis present

## 2018-10-24 DIAGNOSIS — M810 Age-related osteoporosis without current pathological fracture: Secondary | ICD-10-CM | POA: Diagnosis present

## 2018-10-24 DIAGNOSIS — M1990 Unspecified osteoarthritis, unspecified site: Secondary | ICD-10-CM | POA: Diagnosis present

## 2018-10-24 DIAGNOSIS — Z880 Allergy status to penicillin: Secondary | ICD-10-CM | POA: Diagnosis not present

## 2018-10-24 DIAGNOSIS — Z8261 Family history of arthritis: Secondary | ICD-10-CM

## 2018-10-24 DIAGNOSIS — I361 Nonrheumatic tricuspid (valve) insufficiency: Secondary | ICD-10-CM | POA: Diagnosis not present

## 2018-10-24 DIAGNOSIS — M5430 Sciatica, unspecified side: Secondary | ICD-10-CM

## 2018-10-24 DIAGNOSIS — Z79899 Other long term (current) drug therapy: Secondary | ICD-10-CM | POA: Diagnosis not present

## 2018-10-24 DIAGNOSIS — Z8249 Family history of ischemic heart disease and other diseases of the circulatory system: Secondary | ICD-10-CM

## 2018-10-24 DIAGNOSIS — I129 Hypertensive chronic kidney disease with stage 1 through stage 4 chronic kidney disease, or unspecified chronic kidney disease: Secondary | ICD-10-CM | POA: Diagnosis not present

## 2018-10-24 DIAGNOSIS — R040 Epistaxis: Secondary | ICD-10-CM | POA: Diagnosis not present

## 2018-10-24 DIAGNOSIS — E785 Hyperlipidemia, unspecified: Secondary | ICD-10-CM | POA: Diagnosis not present

## 2018-10-24 DIAGNOSIS — I2510 Atherosclerotic heart disease of native coronary artery without angina pectoris: Secondary | ICD-10-CM | POA: Diagnosis not present

## 2018-10-24 DIAGNOSIS — R69 Illness, unspecified: Secondary | ICD-10-CM | POA: Diagnosis not present

## 2018-10-24 DIAGNOSIS — F05 Delirium due to known physiological condition: Secondary | ICD-10-CM | POA: Diagnosis not present

## 2018-10-24 DIAGNOSIS — Z20828 Contact with and (suspected) exposure to other viral communicable diseases: Secondary | ICD-10-CM | POA: Diagnosis present

## 2018-10-24 DIAGNOSIS — R0789 Other chest pain: Secondary | ICD-10-CM | POA: Diagnosis not present

## 2018-10-24 DIAGNOSIS — E782 Mixed hyperlipidemia: Secondary | ICD-10-CM | POA: Diagnosis not present

## 2018-10-24 DIAGNOSIS — I214 Non-ST elevation (NSTEMI) myocardial infarction: Secondary | ICD-10-CM | POA: Diagnosis not present

## 2018-10-24 HISTORY — PX: LEFT HEART CATH AND CORONARY ANGIOGRAPHY: CATH118249

## 2018-10-24 HISTORY — PX: CORONARY STENT INTERVENTION: CATH118234

## 2018-10-24 LAB — CBC WITH DIFFERENTIAL/PLATELET
Abs Immature Granulocytes: 0.03 10*3/uL (ref 0.00–0.07)
Basophils Absolute: 0.1 10*3/uL (ref 0.0–0.1)
Basophils Relative: 1 %
Eosinophils Absolute: 0.1 10*3/uL (ref 0.0–0.5)
Eosinophils Relative: 1 %
HCT: 43.1 % (ref 36.0–46.0)
Hemoglobin: 14.1 g/dL (ref 12.0–15.0)
Immature Granulocytes: 0 %
Lymphocytes Relative: 18 %
Lymphs Abs: 1.8 10*3/uL (ref 0.7–4.0)
MCH: 30.9 pg (ref 26.0–34.0)
MCHC: 32.7 g/dL (ref 30.0–36.0)
MCV: 94.3 fL (ref 80.0–100.0)
Monocytes Absolute: 1.5 10*3/uL — ABNORMAL HIGH (ref 0.1–1.0)
Monocytes Relative: 15 %
Neutro Abs: 6.8 10*3/uL (ref 1.7–7.7)
Neutrophils Relative %: 65 %
Platelets: 318 10*3/uL (ref 150–400)
RBC: 4.57 MIL/uL (ref 3.87–5.11)
RDW: 13.1 % (ref 11.5–15.5)
WBC: 10.4 10*3/uL (ref 4.0–10.5)
nRBC: 0 % (ref 0.0–0.2)

## 2018-10-24 LAB — CBC
HCT: 41.4 % (ref 36.0–46.0)
Hemoglobin: 14.2 g/dL (ref 12.0–15.0)
MCH: 31.7 pg (ref 26.0–34.0)
MCHC: 34.3 g/dL (ref 30.0–36.0)
MCV: 92.4 fL (ref 80.0–100.0)
Platelets: 308 10*3/uL (ref 150–400)
RBC: 4.48 MIL/uL (ref 3.87–5.11)
RDW: 12.9 % (ref 11.5–15.5)
WBC: 12.9 10*3/uL — ABNORMAL HIGH (ref 4.0–10.5)
nRBC: 0 % (ref 0.0–0.2)

## 2018-10-24 LAB — BASIC METABOLIC PANEL
Anion gap: 13 (ref 5–15)
BUN: 35 mg/dL — ABNORMAL HIGH (ref 8–23)
CO2: 22 mmol/L (ref 22–32)
Calcium: 9.5 mg/dL (ref 8.9–10.3)
Chloride: 102 mmol/L (ref 98–111)
Creatinine, Ser: 1.41 mg/dL — ABNORMAL HIGH (ref 0.44–1.00)
GFR calc Af Amer: 39 mL/min — ABNORMAL LOW (ref >60)
GFR calc non Af Amer: 34 mL/min — ABNORMAL LOW (ref >60)
Glucose, Bld: 105 mg/dL — ABNORMAL HIGH (ref 70–99)
Potassium: 4.3 mmol/L (ref 3.5–5.1)
Sodium: 137 mmol/L (ref 135–145)

## 2018-10-24 LAB — MRSA PCR SCREENING: MRSA by PCR: POSITIVE — AB

## 2018-10-24 LAB — CREATININE, SERUM
Creatinine, Ser: 1.09 mg/dL — ABNORMAL HIGH (ref 0.44–1.00)
GFR calc Af Amer: 53 mL/min — ABNORMAL LOW (ref >60)
GFR calc non Af Amer: 46 mL/min — ABNORMAL LOW (ref >60)

## 2018-10-24 LAB — POCT ACTIVATED CLOTTING TIME
Activated Clotting Time: 290 seconds
Activated Clotting Time: 423 seconds
Activated Clotting Time: 224 seconds

## 2018-10-24 LAB — SARS CORONAVIRUS 2 BY RT PCR (HOSPITAL ORDER, PERFORMED IN ~~LOC~~ HOSPITAL LAB): SARS Coronavirus 2: NEGATIVE

## 2018-10-24 LAB — APTT: aPTT: 32 seconds (ref 24–36)

## 2018-10-24 LAB — TROPONIN I (HIGH SENSITIVITY)
Troponin I (High Sensitivity): 2923 ng/L (ref <18)
Troponin I (High Sensitivity): 3250 ng/L (ref <18)

## 2018-10-24 LAB — PROTIME-INR
INR: 1 (ref 0.8–1.2)
Prothrombin Time: 13.1 seconds (ref 11.4–15.2)

## 2018-10-24 SURGERY — LEFT HEART CATH AND CORONARY ANGIOGRAPHY
Anesthesia: LOCAL

## 2018-10-24 MED ORDER — MIDAZOLAM HCL 2 MG/2ML IJ SOLN
INTRAMUSCULAR | Status: DC | PRN
  Administered 2018-10-24 (×2): 0.5 mg via INTRAVENOUS

## 2018-10-24 MED ORDER — VERAPAMIL HCL 2.5 MG/ML IV SOLN
INTRAVENOUS | Status: AC
  Filled 2018-10-24: qty 2

## 2018-10-24 MED ORDER — TICAGRELOR 90 MG PO TABS
ORAL_TABLET | ORAL | Status: DC | PRN
  Administered 2018-10-24: 180 mg via ORAL

## 2018-10-24 MED ORDER — ASPIRIN 81 MG PO CHEW: 324.0000 mg | CHEWABLE_TABLET | ORAL | Status: DC

## 2018-10-24 MED ORDER — ASPIRIN 300 MG RE SUPP: 300.0000 mg | RECTAL | Status: DC

## 2018-10-24 MED ORDER — ASPIRIN 81 MG PO CHEW: 81.0000 mg | CHEWABLE_TABLET | Freq: Every day | ORAL | Status: DC

## 2018-10-24 MED ORDER — CHLORHEXIDINE GLUCONATE CLOTH 2 % EX PADS
6.0000 | MEDICATED_PAD | Freq: Every day | CUTANEOUS | Status: DC
  Administered 2018-10-25: 6 via TOPICAL

## 2018-10-24 MED ORDER — NITROGLYCERIN 1 MG/10 ML FOR IR/CATH LAB
INTRA_ARTERIAL | Status: DC | PRN
  Administered 2018-10-24: 200 ug

## 2018-10-24 MED ORDER — ACETAMINOPHEN 325 MG PO TABS: 650.0000 mg | ORAL_TABLET | ORAL | Status: DC | PRN

## 2018-10-24 MED ORDER — METOPROLOL TARTRATE 25 MG PO TABS
25.0000 mg | ORAL_TABLET | Freq: Two times a day (BID) | ORAL | Status: DC
  Administered 2018-10-24 – 2018-10-26 (×4): 25 mg via ORAL
  Filled 2018-10-24 (×5): qty 1

## 2018-10-24 MED ORDER — LABETALOL HCL 5 MG/ML IV SOLN: 10.0000 mg | INTRAVENOUS | Status: AC | PRN

## 2018-10-24 MED ORDER — NITROGLYCERIN 1 MG/10 ML FOR IR/CATH LAB
INTRA_ARTERIAL | Status: AC
  Filled 2018-10-24: qty 10

## 2018-10-24 MED ORDER — NITROGLYCERIN 0.4 MG SL SUBL
0.4000 mg | SUBLINGUAL_TABLET | SUBLINGUAL | Status: DC | PRN
  Filled 2018-10-24: qty 1

## 2018-10-24 MED ORDER — HEPARIN (PORCINE) IN NACL 1000-0.9 UT/500ML-% IV SOLN
INTRAVENOUS | Status: AC
  Filled 2018-10-24: qty 1500

## 2018-10-24 MED ORDER — HEPARIN (PORCINE) 25000 UT/250ML-% IV SOLN
650.0000 [IU]/h | INTRAVENOUS | Status: DC
  Administered 2018-10-24: 650 [IU]/h via INTRAVENOUS
  Filled 2018-10-24: qty 250

## 2018-10-24 MED ORDER — PANTOPRAZOLE SODIUM 40 MG PO TBEC
40.0000 mg | DELAYED_RELEASE_TABLET | Freq: Every day | ORAL | Status: DC
  Administered 2018-10-25 – 2018-10-26 (×2): 40 mg via ORAL
  Filled 2018-10-24 (×2): qty 1

## 2018-10-24 MED ORDER — SODIUM CHLORIDE 0.9 % IV SOLN
INTRAVENOUS | Status: AC | PRN
  Administered 2018-10-24: 75 mL/h via INTRAVENOUS
  Administered 2018-10-24: 10 mL/h via INTRAVENOUS

## 2018-10-24 MED ORDER — HYDRALAZINE HCL 20 MG/ML IJ SOLN: 10.0000 mg | INTRAMUSCULAR | Status: AC | PRN

## 2018-10-24 MED ORDER — LIDOCAINE HCL (PF) 1 % IJ SOLN
INTRAMUSCULAR | Status: AC
  Filled 2018-10-24: qty 30

## 2018-10-24 MED ORDER — LIDOCAINE HCL (PF) 1 % IJ SOLN
INTRAMUSCULAR | Status: DC | PRN
  Administered 2018-10-24: 2 mL
  Administered 2018-10-24: 10 mL

## 2018-10-24 MED ORDER — HEPARIN BOLUS VIA INFUSION
3000.0000 [IU] | Freq: Once | INTRAVENOUS | Status: AC
  Administered 2018-10-24: 3000 [IU] via INTRAVENOUS

## 2018-10-24 MED ORDER — VERAPAMIL HCL 2.5 MG/ML IV SOLN
INTRAVENOUS | Status: DC | PRN
  Administered 2018-10-24: 10 mL via INTRA_ARTERIAL

## 2018-10-24 MED ORDER — SODIUM CHLORIDE 0.9 % IV SOLN
INTRAVENOUS | Status: AC
  Administered 2018-10-24: 21:00:00 via INTRAVENOUS

## 2018-10-24 MED ORDER — ASPIRIN EC 81 MG PO TBEC
81.0000 mg | DELAYED_RELEASE_TABLET | Freq: Every day | ORAL | Status: DC
  Administered 2018-10-25 – 2018-10-26 (×2): 81 mg via ORAL
  Filled 2018-10-24 (×2): qty 1

## 2018-10-24 MED ORDER — ATORVASTATIN CALCIUM 80 MG PO TABS
80.0000 mg | ORAL_TABLET | Freq: Every day | ORAL | Status: DC
  Filled 2018-10-24: qty 1

## 2018-10-24 MED ORDER — ASPIRIN 81 MG PO CHEW
324.0000 mg | CHEWABLE_TABLET | Freq: Once | ORAL | Status: AC
  Administered 2018-10-24: 324 mg via ORAL
  Filled 2018-10-24: qty 4

## 2018-10-24 MED ORDER — HEPARIN SODIUM (PORCINE) 1000 UNIT/ML IJ SOLN
INTRAMUSCULAR | Status: DC | PRN
  Administered 2018-10-24: 6000 [IU] via INTRAVENOUS
  Administered 2018-10-24: 3000 [IU] via INTRAVENOUS

## 2018-10-24 MED ORDER — FENTANYL CITRATE (PF) 100 MCG/2ML IJ SOLN
INTRAMUSCULAR | Status: DC | PRN
  Administered 2018-10-24 (×2): 12.5 ug via INTRAVENOUS

## 2018-10-24 MED ORDER — SODIUM CHLORIDE 0.9 % IV SOLN: 250.0000 mL | INTRAVENOUS | Status: DC | PRN

## 2018-10-24 MED ORDER — ONDANSETRON HCL 4 MG/2ML IJ SOLN: 4.0000 mg | Freq: Four times a day (QID) | INTRAMUSCULAR | Status: DC | PRN

## 2018-10-24 MED ORDER — TICAGRELOR 90 MG PO TABS
ORAL_TABLET | ORAL | Status: AC
  Filled 2018-10-24: qty 1

## 2018-10-24 MED ORDER — METOPROLOL TARTRATE 50 MG PO TABS
25.0000 mg | ORAL_TABLET | Freq: Once | ORAL | Status: AC
  Administered 2018-10-24: 25 mg via ORAL
  Filled 2018-10-24: qty 1

## 2018-10-24 MED ORDER — HEPARIN SODIUM (PORCINE) 5000 UNIT/ML IJ SOLN
5000.0000 [IU] | Freq: Three times a day (TID) | INTRAMUSCULAR | Status: DC
  Administered 2018-10-25 – 2018-10-26 (×3): 5000 [IU] via SUBCUTANEOUS
  Filled 2018-10-24 (×3): qty 1

## 2018-10-24 MED ORDER — HEPARIN (PORCINE) IN NACL 1000-0.9 UT/500ML-% IV SOLN
INTRAVENOUS | Status: DC | PRN
  Administered 2018-10-24 (×3): 500 mL

## 2018-10-24 MED ORDER — NITROGLYCERIN 0.4 MG SL SUBL: 0.4000 mg | SUBLINGUAL_TABLET | SUBLINGUAL | Status: DC | PRN

## 2018-10-24 MED ORDER — MIDAZOLAM HCL 2 MG/2ML IJ SOLN
INTRAMUSCULAR | Status: AC
  Filled 2018-10-24: qty 2

## 2018-10-24 MED ORDER — HEPARIN SODIUM (PORCINE) 1000 UNIT/ML IJ SOLN
INTRAMUSCULAR | Status: AC
  Filled 2018-10-24: qty 1

## 2018-10-24 MED ORDER — SODIUM CHLORIDE 0.9% FLUSH: 3.0000 mL | INTRAVENOUS | Status: DC | PRN

## 2018-10-24 MED ORDER — SODIUM CHLORIDE 0.9% FLUSH
3.0000 mL | Freq: Two times a day (BID) | INTRAVENOUS | Status: DC
  Administered 2018-10-24 – 2018-10-26 (×4): 3 mL via INTRAVENOUS

## 2018-10-24 MED ORDER — IOHEXOL 350 MG/ML SOLN
INTRAVENOUS | Status: DC | PRN
  Administered 2018-10-24: 105 mL via INTRACARDIAC

## 2018-10-24 MED ORDER — FENTANYL CITRATE (PF) 100 MCG/2ML IJ SOLN
INTRAMUSCULAR | Status: AC
  Filled 2018-10-24: qty 2

## 2018-10-24 MED ORDER — SODIUM CHLORIDE 0.9 % IV BOLUS
500.0000 mL | Freq: Once | INTRAVENOUS | Status: AC
  Administered 2018-10-24: 500 mL via INTRAVENOUS

## 2018-10-24 MED ORDER — TICAGRELOR 90 MG PO TABS
90.0000 mg | ORAL_TABLET | Freq: Two times a day (BID) | ORAL | Status: DC
  Administered 2018-10-25 – 2018-10-26 (×3): 90 mg via ORAL
  Filled 2018-10-24 (×3): qty 1

## 2018-10-24 SURGICAL SUPPLY — 26 items
BAG SNAP BAND KOVER 36X36 (MISCELLANEOUS) ×1 IMPLANT
BALLN SAPPHIRE 2.5X12 (BALLOONS) ×2
BALLOON SAPPHIRE 2.5X12 (BALLOONS) IMPLANT
CATH 5FR JL3.5 JR4 ANG PIG MP (CATHETERS) ×1 IMPLANT
CATH INFINITI 5FR JL4 (CATHETERS) ×1 IMPLANT
CATH LAUNCHER 6FR EBU3.5 (CATHETERS) ×1 IMPLANT
COVER DOME SNAP 22 D (MISCELLANEOUS) ×1 IMPLANT
DEVICE RAD TR BAND REGULAR (VASCULAR PRODUCTS) ×1 IMPLANT
GUIDELINER 6F (CATHETERS) ×1 IMPLANT
KIT ENCORE 26 ADVANTAGE (KITS) ×1 IMPLANT
KIT HEART LEFT (KITS) ×2 IMPLANT
KIT HEMO VALVE WATCHDOG (MISCELLANEOUS) ×1 IMPLANT
KIT MICROPUNCTURE NIT STIFF (SHEATH) ×1 IMPLANT
PACK CARDIAC CATHETERIZATION (CUSTOM PROCEDURE TRAY) ×2 IMPLANT
SHEATH PINNACLE 5F 10CM (SHEATH) ×1 IMPLANT
SHEATH PINNACLE 6F 10CM (SHEATH) ×1 IMPLANT
SHEATH PROBE COVER 6X72 (BAG) ×1 IMPLANT
SHEATH RAIN RADIAL 21G 6FR (SHEATH) ×1 IMPLANT
STENT SYNERGY DES 2.50X8 (Permanent Stent) ×1 IMPLANT
STENT SYNERGY DES 3X28 (Permanent Stent) ×1 IMPLANT
TRANSDUCER W/STOPCOCK (MISCELLANEOUS) ×2 IMPLANT
TUBING CIL FLEX 10 FLL-RA (TUBING) ×2 IMPLANT
WIRE COUGAR XT STRL 190CM (WIRE) ×1 IMPLANT
WIRE EMERALD 3MM-J .035X150CM (WIRE) ×1 IMPLANT
WIRE HI TORQ BMW 190CM (WIRE) ×1 IMPLANT
WIRE HI TORQ VERSACORE-J 145CM (WIRE) ×1 IMPLANT

## 2018-10-24 NOTE — Progress Notes (Signed)
100% Lcx occlusion 2 stents placed 0% residual stenosis Full report to follow  Nigel Mormon, MD [facility] Pager: [phone] Office: [phone] If no answer Cell [phone]

## 2018-10-24 NOTE — ED Provider Notes (Signed)
[facility]ERGENCY DEPARTMENT Provider Note   CSN: [id number] Arrival date & time: 10/24/18  1101    History   Chief Complaint Chief Complaint  Patient presents with  . Chest Pain    HPI Tracy Rogers is a 85 y.o. female.     HPI  85 year old female presents with chest tightness.  Originally started 2 days ago about an hour after breakfast.  At first she felt like her face was flushed and aching and then she noticed some chest tightness.  No shortness of breath.  Went away with Tylenol PM and she took a nap that afternoon.  Did fine yesterday.  Again this morning developed the chest tightness, she does not think it was related to food this time.  It is minimal, maybe a 1 out of 10 and 2 days ago was a 2 out of 10.  No vomiting, back pain, shortness of breath.  No leg swelling.  Nothing makes it better or worse.  Past Medical History:  Diagnosis Date  . Arthritis   . Hyperlipidemia   . Hypertension   . Lumbar herniated disc   . Osteopenia   . Osteoporosis   . Sciatica   . Spinal stenosis     Patient Active Problem List   Diagnosis Date Noted  . NSTEMI (non-ST elevated myocardial infarction) ([place]) 10/24/2018  . Abnormal CT of the abdomen  see 03/2014 CT  CBD dilation referred to GI 05/05/2014  . Osteoporosis  Repeatedly refuses Rx meds or injectables.  See [facility] reports 12/03/2013  . Vitamin D deficiency 10/22/2013  . HTN (hypertension) 10/22/2013  . Hyperlipidemia  Repeatedly refuses  RX meds   10/22/2013  . Spinal stenosis of lumbar region 10/22/2013  . Sciatica 10/22/2013    Past Surgical History:  Procedure Laterality Date  . FRACTURE SURGERY Right    wrist, plate inserted  . lower back surgery  2012   Hernia disk     OB History   No obstetric history on file.      Home Medications    Prior to Admission medications   Medication Sig Start Date End Date Taking? Authorizing Provider  ezetimibe (ZETIA) 10 MG tablet daily.   Yes  [provider]  amLODipine (NORVASC) 5 MG tablet Take 1 tab daily 06/30/14   Schoenhoff, Altamese Cabal, MD  Bilberry 1000 MG CAPS Take 1,000 mg by mouth daily.    [provider]  Biotin 5 MG CAPS Take by mouth.    [provider]  co-enzyme Q-10 30 MG capsule Take 200 mg by mouth daily.    [provider]  lisinopril (PRINIVIL,ZESTRIL) 20 MG tablet Take 1.5 tablets (30 mg total) by mouth daily. 06/30/14   Schoenhoff, Altamese Cabal, MD  Multiple Vitamin (MULTIVITAMIN) capsule Take 1 capsule by mouth daily.    [provider]  multivitamin-lutein (OCUVITE-LUTEIN) CAPS capsule Take 1 capsule by mouth daily.    [provider]  Plant Sterols and Stanols (CHOLEST OFF PO) Take by mouth.    [provider]  traMADol (ULTRAM) 50 MG tablet Take 1 tablet (50 mg total) by mouth every 6 (six) hours as needed. 07/07/14   Orpah Greek, MD    Family History Family History  Problem Relation Age of Onset  . Dementia Mother   . Addison's disease Sister   . Arthritis Sister   . Heart disease Father   . Colon cancer Neg Hx   . Colon polyps Neg Hx   .  Kidney disease Neg Hx   . Diabetes Neg Hx   . Esophageal cancer Neg Hx   . Gallbladder disease Neg Hx     Social History Social History   Tobacco Use  . Smoking status: Never Smoker  . Smokeless tobacco: Never Used  Substance Use Topics  . Alcohol use: Yes    Alcohol/week: 0.0 - 1.0 standard drinks  . Drug use: No     Allergies   Penicillins   Review of Systems Review of Systems  Constitutional: Negative for fever.  Respiratory: Positive for chest tightness. Negative for cough and shortness of breath.   Cardiovascular: Negative for leg swelling.  Gastrointestinal: Negative for abdominal pain and vomiting.  Musculoskeletal: Negative for back pain.  All other systems reviewed and are negative.    Physical Exam Updated Vital Signs BP (!) 152/86   Pulse 80   Temp 98.3 F  (36.8 C) (Oral)   Resp 16   Ht 5\' 2"  (1.575 m)   Wt 54.9 kg   SpO2 97%   BMI 22.13 kg/m   Physical Exam Vitals signs and nursing note reviewed.  Constitutional:      General: She is not in acute distress.    Appearance: She is well-developed. She is not ill-appearing or diaphoretic.  HENT:     Head: Normocephalic and atraumatic.     Right Ear: External ear normal.     Left Ear: External ear normal.     Nose: Nose normal.  Eyes:     General:        Right eye: No discharge.        Left eye: No discharge.  Cardiovascular:     Rate and Rhythm: Normal rate and regular rhythm.     Pulses:          Radial pulses are 2+ on the right side and 2+ on the left side.     Heart sounds: Normal heart sounds.  Pulmonary:     Effort: Pulmonary effort is normal.     Breath sounds: Normal breath sounds.  Chest:     Chest wall: No tenderness.  Abdominal:     Palpations: Abdomen is soft.     Tenderness: There is no abdominal tenderness.  Musculoskeletal:     Right lower leg: She exhibits no tenderness. No edema.     Left lower leg: She exhibits no tenderness. No edema.  Skin:    General: Skin is warm and dry.  Neurological:     Mental Status: She is alert.  Psychiatric:        Mood and Affect: Mood is not anxious.      ED Treatments / Results  Labs (all labs ordered are listed, but only abnormal results are displayed) Labs Reviewed  BASIC METABOLIC PANEL - Abnormal; Notable for the following components:      Result Value   Glucose, Bld 105 (*)    BUN 35 (*)    Creatinine, Ser 1.41 (*)    GFR calc non Af Amer 34 (*)    GFR calc Af Amer 39 (*)    All other components within normal limits  CBC WITH DIFFERENTIAL/PLATELET - Abnormal; Notable for the following components:   Monocytes Absolute 1.5 (*)    All other components within normal limits  TROPONIN I (HIGH SENSITIVITY) - Abnormal; Notable for the following components:   Troponin I (High Sensitivity) 2,923 (*)    All other  components within normal limits  TROPONIN I (HIGH  SENSITIVITY) - Abnormal; Notable for the following components:   Troponin I (High Sensitivity) 3,250 (*)    All other components within normal limits  SARS CORONAVIRUS 2 (HOSPITAL ORDER, [facility])  APTT  PROTIME-INR    EKG EKG Interpretation  Date/Time:  Thursday October 24 2018 11:14:25 EDT Ventricular Rate:  89 PR Interval:    QRS Duration: 97 QT Interval:  370 QTC Calculation: 451 R Axis:   -50 Text Interpretation:  Sinus rhythm Consider right atrial enlargement Left anterior fascicular block Abnormal R-wave progression, late transition Left ventricular hypertrophy Nonspecific T abnormalities, lateral leads no significant change since 2016 Confirmed by Sherwood Gambler [phone]) on 10/24/2018 11:23:45 AM   Radiology Dg Chest Portable 1 View  Result Date: 10/24/2018 CLINICAL DATA:  Chest is comfort for 2-3 days. EXAM: PORTABLE CHEST 1 VIEW COMPARISON:  Chest x-rays dated 04/25/2014 and 11/07/2010. FINDINGS: Heart size and mediastinal contours are stable. Lungs are clear. No pleural effusion or pneumothorax seen. Osseous structures about the chest are unremarkable. IMPRESSION: No active disease. No evidence of pneumonia or pulmonary edema. Electronically Signed   By: Franki Cabot M.D.   On: 10/24/2018 12:02    Procedures .Critical Care Performed by: Sherwood Gambler, MD Authorized by: Sherwood Gambler, MD   Critical care provider statement:    Critical care time (minutes):  35   Critical care time was exclusive of:  Separately billable procedures and treating other patients   Critical care was necessary to treat or prevent imminent or life-threatening deterioration of the following conditions:  Cardiac failure and circulatory failure   Critical care was time spent personally by me on the following activities:  Discussions with consultants, evaluation of patient's response to treatment, examination of patient,  ordering and performing treatments and interventions, ordering and review of laboratory studies, ordering and review of radiographic studies, pulse oximetry, re-evaluation of patient's condition, obtaining history from patient or surrogate and review of old charts   (including critical care time)  Medications Ordered in ED Medications  nitroGLYCERIN (NITROSTAT) SL tablet 0.4 mg (0 mg Sublingual Hold 10/24/18 1146)  heparin ADULT infusion 100 units/mL (25000 units/22mL sodium chloride 0.45%) (650 Units/hr Intravenous New Bag/Given 10/24/18 1327)  aspirin chewable tablet 324 mg (324 mg Oral Given 10/24/18 1134)  heparin bolus via infusion 3,000 Units (3,000 Units Intravenous Bolus from Bag 10/24/18 1327)  metoprolol tartrate (LOPRESSOR) tablet 25 mg (25 mg Oral Given 10/24/18 1320)  sodium chloride 0.9 % bolus 500 mL (500 mLs Intravenous New Bag/Given 10/24/18 1455)     Initial Impression / Assessment and Plan / ED Course  I have reviewed the triage vital signs and the nursing notes.  Pertinent labs & imaging results that were available during my care of the patient were reviewed by me and considered in my medical decision making (see chart for details).        Patient's troponin is quite elevated, concerning for NSTEMI.  At first I talked to [facility], Dr. Loletha Grayer, who recommends metoprolol p.o. and IV heparin.  However it turns out that she is unassigned and so I talked to Dr. Einar Gip.  His partner will admit.  Patient is currently chest pain-free.  Highly doubt dissection or PE at this point.  Final Clinical Impressions(s) / ED Diagnoses   Final diagnoses:  NSTEMI (non-ST elevated myocardial infarction) [facility])    ED Discharge Orders    None       Sherwood Gambler, MD 10/24/18 1534

## 2018-10-24 NOTE — Interval H&P Note (Signed)
History and Physical Interval Note:  10/24/2018 4:53 PM  Tracy Rogers  has presented today for surgery, with the diagnosis of nonstemi.  The various methods of treatment have been discussed with the patient and family. After consideration of risks, benefits and other options for treatment, the patient has consented to  Procedure(s): LEFT HEART CATH AND CORONARY ANGIOGRAPHY (N/A) as a surgical intervention.  The patient's history has been reviewed, patient examined, no change in status, stable for surgery.  I have reviewed the patient's chart and labs.  Questions were answered to the patient's satisfaction.    2016 Appropriate Use Criteria for Coronary Revascularization in Patients With Acute Coronary Syndrome NSTEMI/UA High Risk (TIMI Score 5-7) NSTEMI/Unstable angina, stabilized patient at high risk Link Here: sistemancia.com Indication:  Revascularization by PCI or CABG of 1 or more arteries in a patient with NSTEMI or unstable angina with Stabilization after presentation High risk for clinical events  A (7) Indication: 16; Score 7     [place]

## 2018-10-24 NOTE — ED Triage Notes (Signed)
Pt had tightness in chest and facial pain after breakfast 2 days ago; felt ok yesterday and then chest tightness began again today

## 2018-10-24 NOTE — H&P (Signed)
Tracy Rogers is an 85 y.o. female.   Chief Complaint: Chest pain HPI:   85 y.o. Caucasian female  with hypertension, hyperlipidemia, CKD3, arthritis, presented to [place] with off and on chest pressure for two days.  Work-up at [facility] showed elevated high-sensitivity troponin > 3000.  She was transferred to [facility] for further management.  Patient is currently chest pain-free, but has been having off and on pressure as described.  She denies any shortness of breath or any other symptoms.  Patient is fairly independent at baseline, lives alone with 2 cats.  She performs all her ADLs herself.   Past Medical History:  Diagnosis Date  . Arthritis   . Hyperlipidemia   . Hypertension   . Lumbar herniated disc   . Osteopenia   . Osteoporosis   . Sciatica   . Spinal stenosis     Past Surgical History:  Procedure Laterality Date  . FRACTURE SURGERY Right    wrist, plate inserted  . lower back surgery  2012   Hernia disk    Family History  Problem Relation Age of Onset  . Dementia Mother   . Addison's disease Sister   . Arthritis Sister   . Heart disease Father   . Colon cancer Neg Hx   . Colon polyps Neg Hx   . Kidney disease Neg Hx   . Diabetes Neg Hx   . Esophageal cancer Neg Hx   . Gallbladder disease Neg Hx    Social History:  reports that she has never smoked. She has never used smokeless tobacco. She reports current alcohol use. She reports that she does not use drugs.  Allergies:  Allergies  Allergen Reactions  . Penicillins     Review of Systems  Constitution: Negative for decreased appetite, malaise/fatigue, weight gain and weight loss.  HENT: Negative for congestion.   Eyes: Negative for visual disturbance.  Cardiovascular: Negative for chest pain, dyspnea on exertion, leg swelling, palpitations and syncope.  Respiratory: Negative for cough.   Endocrine: Negative for cold intolerance.  Hematologic/Lymphatic:  Does not bruise/bleed easily.  Skin: Negative for itching and rash.  Musculoskeletal: Negative for myalgias.  Gastrointestinal: Negative for abdominal pain, nausea and vomiting.  Genitourinary: Negative for dysuria.  Neurological: Negative for dizziness and weakness.  Psychiatric/Behavioral: The patient is not nervous/anxious.   All other systems reviewed and are negative.    Blood pressure (!) 171/94, pulse 83, temperature 98.3 F (36.8 C), temperature source Oral, resp. rate (!) 25, height 5\' 2"  (1.575 m), weight 54.9 kg, SpO2 97 %. Body mass index is 22.13 kg/m.  Physical Exam  Constitutional: She is oriented to person, place, and time. She appears well-developed and well-nourished. No distress.  HENT:  Head: Normocephalic and atraumatic.  Eyes: Pupils are equal, round, and reactive to light. Conjunctivae are normal.  Neck: No JVD present.  Cardiovascular: Normal rate, regular rhythm and intact distal pulses.  Pulmonary/Chest: Effort normal and breath sounds normal. She has no wheezes. She has no rales.  Abdominal: Soft. Bowel sounds are normal. There is no rebound.  Musculoskeletal:        General: No edema.  Lymphadenopathy:    She has no cervical adenopathy.  Neurological: She is alert and oriented to person, place, and time. No cranial nerve deficit.  Skin: Skin is warm and dry.  Psychiatric: She has a normal mood and affect.  Nursing note and vitals reviewed.   Results for orders placed or  performed during the hospital encounter of 10/24/18 (from the past 48 hour(s))  Basic metabolic panel     Status: Abnormal   Collection Time: 10/24/18 11:46 AM  Result Value Ref Range   Sodium 137 135 - 145 mmol/L   Potassium 4.3 3.5 - 5.1 mmol/L   Chloride 102 98 - 111 mmol/L   CO2 22 22 - 32 mmol/L   Glucose, Bld 105 (H) 70 - 99 mg/dL   BUN 35 (H) 8 - 23 mg/dL   Creatinine, Ser 1.41 (H) 0.44 - 1.00 mg/dL   Calcium 9.5 8.9 - 10.3 mg/dL   GFR calc non Af Amer 34 (L) >60 mL/min    GFR calc Af Amer 39 (L) >60 mL/min   Anion gap 13 5 - 15    Comment: Performed at [facility], [address] [place], [place] [address]  Troponin I (High Sensitivity)     Status: Abnormal   Collection Time: 10/24/18 11:46 AM  Result Value Ref Range   Troponin I (High Sensitivity) 2,923 (HH) <18 ng/L    Comment: CRITICAL RESULT CALLED TO, READ BACK BY AND VERIFIED WITH: AMY HARTLEY RN @1244  10/24/2018 OLSONM (NOTE) Elevated high sensitivity troponin I (hsTnI) values and significant  changes across serial measurements may suggest ACS but many other  chronic and acute conditions are known to elevate hsTnI results.  Refer to the Links section for chest pain algorithms and additional  guidance. Performed at [facility], [address] [place], [place] [address]   CBC with Differential     Status: Abnormal   Collection Time: 10/24/18 11:46 AM  Result Value Ref Range   WBC 10.4 4.0 - 10.5 K/uL   RBC 4.57 3.87 - 5.11 MIL/uL   Hemoglobin 14.1 12.0 - 15.0 g/dL   HCT 43.1 36.0 - 46.0 %   MCV 94.3 80.0 - 100.0 fL   MCH 30.9 26.0 - 34.0 pg   MCHC 32.7 30.0 - 36.0 g/dL   RDW 13.1 11.5 - 15.5 %   Platelets 318 150 - 400 K/uL   nRBC 0.0 0.0 - 0.2 %   Neutrophils Relative % 65 %   Neutro Abs 6.8 1.7 - 7.7 K/uL   Lymphocytes Relative 18 %   Lymphs Abs 1.8 0.7 - 4.0 K/uL   Monocytes Relative 15 %   Monocytes Absolute 1.5 (H) 0.1 - 1.0 K/uL   Eosinophils Relative 1 %   Eosinophils Absolute 0.1 0.0 - 0.5 K/uL   Basophils Relative 1 %   Basophils Absolute 0.1 0.0 - 0.1 K/uL   Immature Granulocytes 0 %   Abs Immature Granulocytes 0.03 0.00 - 0.07 K/uL    Comment: Performed at [facility], [address] [place], [place] [address]  APTT     Status: None   Collection Time: 10/24/18 11:46 AM  Result Value Ref Range   aPTT 32 24 - 36 seconds    Comment: Performed at [facility], [address] [place], [place] [address]  Protime-INR      Status: None   Collection Time: 10/24/18 11:46 AM  Result Value Ref Range   Prothrombin Time 13.1 11.4 - 15.2 seconds   INR 1.0 0.8 - 1.2    Comment: (NOTE) INR goal varies based on device and disease states. Performed at [facility], [address] [place], [place] [address]   Troponin I (High Sensitivity)     Status:  Abnormal   Collection Time: 10/24/18  1:37 PM  Result Value Ref Range   Troponin I (High Sensitivity) 3,250 (HH) <18 ng/L    Comment: CRITICAL RESULT CALLED TO, READ BACK BY AND VERIFIED WITH: AMY HARTLET RN @1424  10/24/2018 OLSONM (NOTE) Elevated high sensitivity troponin I (hsTnI) values and significant  changes across serial measurements may suggest ACS but many other  chronic and acute conditions are known to elevate hsTnI results.  Refer to the Links section for chest pain algorithms and additional  guidance. Performed at [facility], [address] [place], [place] [address]   SARS Coronavirus 2 (Performed in [facility] hospital lab)     Status: None   Collection Time: 10/24/18  1:37 PM   Specimen: Nasopharyngeal Swab  Result Value Ref Range   SARS Coronavirus 2 NEGATIVE NEGATIVE    Comment: (NOTE) If result is NEGATIVE SARS-CoV-2 target nucleic acids are NOT DETECTED. The SARS-CoV-2 RNA is generally detectable in upper and lower  respiratory specimens during the acute phase of infection. The lowest  concentration of SARS-CoV-2 viral copies this assay can detect is 250  copies / mL. A negative result does not preclude SARS-CoV-2 infection  and should not be used as the sole basis for treatment or other  patient management decisions.  A negative result may occur with  improper specimen collection / handling, submission of specimen other  than nasopharyngeal swab, presence of viral mutation(s) within the  areas targeted by this assay, and inadequate number of viral copies  (<250 copies / mL). A negative result must be combined  with clinical  observations, patient history, and epidemiological information. If result is POSITIVE SARS-CoV-2 target nucleic acids are DETECTED. The SARS-CoV-2 RNA is generally detectable in upper and lower  respiratory specimens dur ing the acute phase of infection.  Positive  results are indicative of active infection with SARS-CoV-2.  Clinical  correlation with patient history and other diagnostic information is  necessary to determine patient infection status.  Positive results do  not rule out bacterial infection or co-infection with other viruses. If result is PRESUMPTIVE POSTIVE SARS-CoV-2 nucleic acids MAY BE PRESENT.   A presumptive positive result was obtained on the submitted specimen  and confirmed on repeat testing.  While 2019 novel coronavirus  (SARS-CoV-2) nucleic acids may be present in the submitted sample  additional confirmatory testing may be necessary for epidemiological  and / or clinical management purposes  to differentiate between  SARS-CoV-2 and other Sarbecovirus currently known to infect humans.  If clinically indicated additional testing with an alternate test  methodology [phone]) is advised. The SARS-CoV-2 RNA is generally  detectable in upper and lower respiratory sp ecimens during the acute  phase of infection. The expected result is Negative. Fact Sheet for Patients:  StrictlyIdeas.no Fact Sheet for Healthcare Providers: BankingDealers.co.za This test is not yet approved or cleared by the [place] FDA and has been authorized for detection and/or diagnosis of SARS-CoV-2 by FDA under an Emergency Use Authorization (EUA).  This EUA will remain in effect (meaning this test can be used) for the duration of the COVID-19 declaration under Section 564(b)(1) of the Act, 21 U.S.C. section 360bbb-3(b)(1), unless the authorization is terminated or revoked sooner. Performed at [facility], [address] [place], [place] [address]     Labs:   Lab Results  Component Value Date   WBC 10.4 10/24/2018   HGB 14.1 10/24/2018   HCT 43.1 10/24/2018  MCV 94.3 10/24/2018   PLT 318 10/24/2018    Recent Labs  Lab 10/24/18 1146  NA 137  K 4.3  CL 102  CO2 22  BUN 35*  CREATININE 1.41*  CALCIUM 9.5  GLUCOSE 105*    Lipid Panel     Component Value Date/Time   CHOL 293 (H) 06/30/2014 0939   TRIG 224 (H) 06/30/2014 0939   HDL 63 06/30/2014 0939   CHOLHDL 4.7 06/30/2014 0939   VLDL 45 (H) 06/30/2014 0939   LDLCALC 185 (H) 06/30/2014 0939    BNP (last 3 results) No results for input(s): BNP in the last 8760 hours.  HEMOGLOBIN A1C No results found for: HGBA1C, MPG  Cardiac Panel (last 3 results) No results for input(s): CKTOTAL, CKMB, TROPONINI, RELINDX in the last 8760 hours.  Lab Results  Component Value Date   CKTOTAL 128 11/08/2010   CKMB 3.2 11/08/2010   TROPONINI <0.03 04/25/2014     TSH No results for input(s): TSH in the last 8760 hours.   Medications Prior to Admission  Medication Sig Dispense Refill  . ezetimibe (ZETIA) 10 MG tablet daily.    Marland Kitchen amLODipine (NORVASC) 5 MG tablet Take 1 tab daily 90 tablet 1  . Bilberry 1000 MG CAPS Take 1,000 mg by mouth daily.    . Biotin 5 MG CAPS Take by mouth.    . co-enzyme Q-10 30 MG capsule Take 200 mg by mouth daily.    Marland Kitchen lisinopril (PRINIVIL,ZESTRIL) 20 MG tablet Take 1.5 tablets (30 mg total) by mouth daily. 135 tablet 1  . Multiple Vitamin (MULTIVITAMIN) capsule Take 1 capsule by mouth daily.    . multivitamin-lutein (OCUVITE-LUTEIN) CAPS capsule Take 1 capsule by mouth daily.    . Plant Sterols and Stanols (CHOLEST OFF PO) Take by mouth.    . traMADol (ULTRAM) 50 MG tablet Take 1 tablet (50 mg total) by mouth every 6 (six) hours as needed. 30 tablet 0      Current Facility-Administered Medications:  .  heparin ADULT infusion 100 units/mL (25000 units/280mL sodium chloride 0.45%), 650 Units/hr,  Intravenous, Continuous, Sherwood Gambler, MD, Last Rate: 6.5 mL/hr at 10/24/18 1327, 650 Units/hr at 10/24/18 1327 .  [MAR Hold] nitroGLYCERIN (NITROSTAT) SL tablet 0.4 mg, 0.4 mg, Sublingual, Q5 min PRN, Sherwood Gambler, MD, Stopped at 10/24/18 1146   Today's Vitals   10/24/18 1353 10/24/18 1400 10/24/18 1430 10/24/18 1500  BP: (!) 172/95 (!) 160/86 (!) 152/86 (!) 171/94  Pulse: 85 81 80 83  Resp: 18 18 16  (!) 25  Temp: 98.3 F (36.8 C)     TempSrc: Oral     SpO2: 99% 100% 97% 97%  Weight:      Height:      PainSc:       Body mass index is 22.13 kg/m.  CARDIAC STUDIES:  EKG 10/24/2018: Sinus rhythm.  Left anterior fascicular block.  Nonspecific T wave inversion lateral leads.  Echocardiogram pending:  Echocardiogram 2010: - Left ventricle: The cavity size was normal. There was mild   concentric hypertrophy. Systolic function was normal. The   estimated ejection fraction was in the range of 55% to 65%. Wall   motion was normal; there were no regional wall motion   abnormalities. Doppler parameters are consistent with abnormal   left ventricular relaxation (grade 1 diastolic dysfunction).  - Mitral valve: Calcified annulus. Mild regurgitation.  - Atrial septum: No defect or patent foramen ovale was identified.  - Pulmonary arteries: PA peak pressure: 62mm  Hg (S).   Assessment/Plan  85 y.o. Caucasian female  with hypertension, hyperlipidemia, CKD3, arthritis  NSTEMI:  Nonspecific EKG changes.  High-sensitivity troponin greater than 3000. Recommend coronary angiography and possible intervention.  We discussed regarding risks, benefits, alternatives to this including stress testing, CTA and continued medical therapy. Patient wants to proceed. Understands <1-2% risk of death, stroke, MI, urgent CABG, bleeding, infection, renal failure but not limited to these.  Given her creatinine of 1.4, with unknown baseline, I will stage any complex intervention if  needed.  CKD 3: Hydration  Hyperlipidemia: Will start high intensity statin.  I discussed the above recommendations with patient and her niece over the phone.  All questions answered.  We will proceed with coronary angiography today.  Nigel Mormon, MD 10/24/2018, 4:40 PM [facility] Pager: [phone] Office: [phone] If no answer: [phone]

## 2018-10-24 NOTE — Progress Notes (Signed)
ANTICOAGULATION CONSULT NOTE - Initial Consult  Pharmacy Consult for HEPARIN Indication: chest pain/ACS  Allergies  Allergen Reactions  . Penicillins     Patient Measurements: Height: 5\' 2"  (157.5 cm) Weight: 121 lb (54.9 kg) IBW/kg (Calculated) : 50.1 Heparin Dosing Weight: 55 kg  Vital Signs: BP: 166/84 (07/30 1230) Pulse Rate: 84 (07/30 1230)  Labs: Recent Labs    10/24/18 1146  HGB 14.1  HCT 43.1  PLT 318  CREATININE 1.41*  TROPONINIHS 2,923*    Estimated Creatinine Clearance: 22.7 mL/min (A) (by C-G formula based on SCr of 1.41 mg/dL (H)).   Medical History: Past Medical History:  Diagnosis Date  . Arthritis   . Hyperlipidemia   . Hypertension   . Lumbar herniated disc   . Osteopenia   . Osteoporosis   . Sciatica   . Spinal stenosis     Medications:  (Not in a hospital admission)   Assessment: Chest pain with positive troponin  Goal of Therapy:  Heparin level 0.3-0.7 units/ml Monitor platelets by anticoagulation protocol: Yes   Plan:  Give 3000 units bolus x 1 Start heparin infusion at 650 units/hr Check anti-Xa level in 8 hours and daily while on heparin Continue to monitor H&H and platelets  Mallie Mussel A Isela Stantz 10/24/2018,1:08 PM

## 2018-10-24 NOTE — ED Notes (Signed)
Report to Aaron Edelman, RN in the cath lab.

## 2018-10-25 ENCOUNTER — Inpatient Hospital Stay (HOSPITAL_COMMUNITY): Payer: Medicare HMO

## 2018-10-25 DIAGNOSIS — N183 Chronic kidney disease, stage 3 (moderate): Secondary | ICD-10-CM | POA: Diagnosis not present

## 2018-10-25 DIAGNOSIS — E782 Mixed hyperlipidemia: Secondary | ICD-10-CM | POA: Diagnosis not present

## 2018-10-25 DIAGNOSIS — I214 Non-ST elevation (NSTEMI) myocardial infarction: Secondary | ICD-10-CM | POA: Diagnosis not present

## 2018-10-25 DIAGNOSIS — I361 Nonrheumatic tricuspid (valve) insufficiency: Secondary | ICD-10-CM

## 2018-10-25 DIAGNOSIS — I129 Hypertensive chronic kidney disease with stage 1 through stage 4 chronic kidney disease, or unspecified chronic kidney disease: Secondary | ICD-10-CM | POA: Diagnosis not present

## 2018-10-25 LAB — BASIC METABOLIC PANEL
Anion gap: 8 (ref 5–15)
BUN: 20 mg/dL (ref 8–23)
CO2: 23 mmol/L (ref 22–32)
Calcium: 8.7 mg/dL — ABNORMAL LOW (ref 8.9–10.3)
Chloride: 107 mmol/L (ref 98–111)
Creatinine, Ser: 1.01 mg/dL — ABNORMAL HIGH (ref 0.44–1.00)
GFR calc Af Amer: 58 mL/min — ABNORMAL LOW (ref >60)
GFR calc non Af Amer: 50 mL/min — ABNORMAL LOW (ref >60)
Glucose, Bld: 108 mg/dL — ABNORMAL HIGH (ref 70–99)
Potassium: 3.7 mmol/L (ref 3.5–5.1)
Sodium: 138 mmol/L (ref 135–145)

## 2018-10-25 LAB — GLUCOSE, CAPILLARY
Glucose-Capillary: 94 mg/dL (ref 70–99)
Glucose-Capillary: 119 mg/dL — ABNORMAL HIGH (ref 70–99)

## 2018-10-25 LAB — CBC
HCT: 41 % (ref 36.0–46.0)
Hemoglobin: 13.7 g/dL (ref 12.0–15.0)
MCH: 31.5 pg (ref 26.0–34.0)
MCHC: 33.4 g/dL (ref 30.0–36.0)
MCV: 94.3 fL (ref 80.0–100.0)
Platelets: 306 10*3/uL (ref 150–400)
RBC: 4.35 MIL/uL (ref 3.87–5.11)
RDW: 12.8 % (ref 11.5–15.5)
WBC: 11.7 10*3/uL — ABNORMAL HIGH (ref 4.0–10.5)
nRBC: 0 % (ref 0.0–0.2)

## 2018-10-25 LAB — POCT ACTIVATED CLOTTING TIME
Activated Clotting Time: 158 seconds
Activated Clotting Time: 180 seconds

## 2018-10-25 LAB — ECHOCARDIOGRAM COMPLETE
Height: 62 in
Weight: 1936 oz

## 2018-10-25 MED ORDER — AMLODIPINE BESYLATE 5 MG PO TABS
5.0000 mg | ORAL_TABLET | Freq: Every day | ORAL | Status: DC
  Administered 2018-10-25 – 2018-10-26 (×2): 5 mg via ORAL
  Filled 2018-10-25 (×2): qty 1

## 2018-10-25 MED ORDER — TICAGRELOR 90 MG PO TABS: 90.0000 mg | ORAL_TABLET | Freq: Two times a day (BID) | ORAL | 2 refills | Status: DC

## 2018-10-25 MED ORDER — NITROGLYCERIN 0.4 MG SL SUBL: 0.4000 mg | SUBLINGUAL_TABLET | SUBLINGUAL | 2 refills | Status: DC | PRN

## 2018-10-25 MED ORDER — COENZYME Q10 30 MG PO CAPS: 200.0000 mg | ORAL_CAPSULE | Freq: Every day | ORAL | Status: DC

## 2018-10-25 MED ORDER — ASPIRIN 81 MG PO TBEC: 81.0000 mg | DELAYED_RELEASE_TABLET | Freq: Every day | ORAL | 2 refills | Status: DC

## 2018-10-25 MED ORDER — ATROPINE SULFATE 1 MG/10ML IJ SOSY
PREFILLED_SYRINGE | INTRAMUSCULAR | Status: AC
  Filled 2018-10-25: qty 10

## 2018-10-25 MED ORDER — ROSUVASTATIN CALCIUM 40 MG PO TABS: 40.0000 mg | ORAL_TABLET | Freq: Every day | ORAL | 11 refills | Status: DC

## 2018-10-25 MED ORDER — METOPROLOL TARTRATE 25 MG PO TABS: 25.0000 mg | ORAL_TABLET | Freq: Two times a day (BID) | ORAL | 2 refills | Status: DC

## 2018-10-25 MED ORDER — EZETIMIBE 10 MG PO TABS
10.0000 mg | ORAL_TABLET | Freq: Every day | ORAL | Status: DC
  Administered 2018-10-25 – 2018-10-26 (×2): 10 mg via ORAL
  Filled 2018-10-25 (×2): qty 1

## 2018-10-25 MED ORDER — LISINOPRIL 20 MG PO TABS
30.0000 mg | ORAL_TABLET | Freq: Every day | ORAL | Status: DC
  Administered 2018-10-25 – 2018-10-26 (×2): 30 mg via ORAL
  Filled 2018-10-25: qty 1

## 2018-10-25 MED ORDER — PANTOPRAZOLE SODIUM 40 MG PO TBEC: 40.0000 mg | DELAYED_RELEASE_TABLET | Freq: Every day | ORAL | 2 refills | Status: DC

## 2018-10-25 MED ORDER — MUPIROCIN 2 % EX OINT
TOPICAL_OINTMENT | Freq: Two times a day (BID) | CUTANEOUS | Status: DC
  Administered 2018-10-25: 1 via NASAL
  Administered 2018-10-25: 21:00:00 via NASAL
  Administered 2018-10-25: 1 via NASAL
  Filled 2018-10-25 (×2): qty 22

## 2018-10-25 NOTE — Progress Notes (Addendum)
CARDIAC REHAB PHASE I   Stent and MI education completed with pt. Pt educated on importance of ASA, Brilinta, and NTG. Pt given MI book and heart healthy diet. Reviewed restrictions. Will refer to CRP II [place]. Will follow-up tomorrow to ambulate pt and give exercise guidelines.  Pt concerned about medication costs, and about going home alone. Pt also states some hallucinations after taking medications earlier today.   [id number] Rufina Falco, RN BSN 10/25/2018 2:54 PM

## 2018-10-25 NOTE — Progress Notes (Signed)
Sheath removed at [facility]. Pressure held until 0040. Vitals stable. Level 0 at the site.   At 2250 Cath lab tech assessed and redressed right radial TR band due to bleeding at the cath site. Air in TR band is 13cc per tech. Will continue to remove per protocol.

## 2018-10-25 NOTE — Progress Notes (Signed)
PHARMACIST - PHYSICIAN ORDER COMMUNICATION  CONCERNING: P&T Medication Policy on Herbal Medications  DESCRIPTION:  This patient's order for:  Co-Q10  has been noted.  This product(s) is classified as an "herbal" or natural product. Due to a lack of definitive safety studies or FDA approval, nonstandard manufacturing practices, plus the potential risk of unknown drug-drug interactions while on inpatient medications, the Pharmacy and Therapeutics Committee does not permit the use of "herbal" or natural products of this type within [facility].   ACTION TAKEN: The pharmacy department is unable to verify this order at this time and your patient has been informed of this safety policy. Please reevaluate patient's clinical condition at discharge and address if the herbal or natural product(s) should be resumed at that time.  Erin Hearing PharmD., BCPS Clinical Pharmacist 10/25/2018 7:52 AM

## 2018-10-26 DIAGNOSIS — I214 Non-ST elevation (NSTEMI) myocardial infarction: Secondary | ICD-10-CM | POA: Diagnosis not present

## 2018-10-26 DIAGNOSIS — E782 Mixed hyperlipidemia: Secondary | ICD-10-CM | POA: Diagnosis not present

## 2018-10-26 DIAGNOSIS — I129 Hypertensive chronic kidney disease with stage 1 through stage 4 chronic kidney disease, or unspecified chronic kidney disease: Secondary | ICD-10-CM | POA: Diagnosis not present

## 2018-10-26 DIAGNOSIS — N183 Chronic kidney disease, stage 3 (moderate): Secondary | ICD-10-CM | POA: Diagnosis not present

## 2018-10-26 LAB — BASIC METABOLIC PANEL
Anion gap: 7 (ref 5–15)
BUN: 17 mg/dL (ref 8–23)
CO2: 23 mmol/L (ref 22–32)
Calcium: 8.9 mg/dL (ref 8.9–10.3)
Chloride: 109 mmol/L (ref 98–111)
Creatinine, Ser: 1.15 mg/dL — ABNORMAL HIGH (ref 0.44–1.00)
GFR calc Af Amer: 50 mL/min — ABNORMAL LOW (ref >60)
GFR calc non Af Amer: 43 mL/min — ABNORMAL LOW (ref >60)
Glucose, Bld: 96 mg/dL (ref 70–99)
Potassium: 3.6 mmol/L (ref 3.5–5.1)
Sodium: 139 mmol/L (ref 135–145)

## 2018-10-26 LAB — GLUCOSE, CAPILLARY: Glucose-Capillary: 90 mg/dL (ref 70–99)

## 2018-10-26 MED ORDER — CHLORHEXIDINE GLUCONATE CLOTH 2 % EX PADS: 6.0000 | MEDICATED_PAD | Freq: Every day | CUTANEOUS | 0 refills | Status: DC

## 2018-10-26 NOTE — Progress Notes (Signed)
Pt has orders to be discharged. Discharge instructions given and pt has no additional questions at this time. Medication regimen reviewed and pt educated. Patient was given brilinta benefit card/phamphlet. She verbally understands the importance of taking this medication. Home health has been set up via case manager. Patient has been referred to cardiac rehab outpatient. Pt verbalized understanding with all discharge instructions and has no additional questions. Telemetry box removed. IVs removed and sites in good condition. Pt stable and waiting for transportation.

## 2018-10-26 NOTE — TOC Transition Note (Signed)
Transition of Care [facility]) - CM/SW Discharge Note   Patient Details  Name: Tracy Rogers MRN: [id number] Date of Birth: [birth year]  [facility] CM/SW Contact:  Carles Collet, RN Phone Number: 10/26/2018, 10:24 AM   Clinical Narrative:    Spoke w patient at bedside. She states she lives at home alone. She would like PT when initially discharged. Discussed providers, chose [facility]. She has a 4 prong cane that she feels is adequate for support at DC. She states her niece is available for transport home. Nurse provided with Brilinta card. No other CM needs identified.     Final next level of care: [place] Barriers to Discharge: No Barriers Identified   Patient Goals and CMS Choice Patient states their goals for this hospitalization and ongoing recovery are:: to return home CMS Medicare.gov Compare Post Acute Care list provided to:: Patient Choice offered to / list presented to : Patient  Discharge Placement                       Discharge Plan and Services                DME Arranged: N/A         HH Arranged: PT HH Agency: [place] Date [place]: 10/26/18 Time HH Agency Contacted: [phone] Representative spoke with at [place]: [place] ([place]) Interventions     Readmission Risk Interventions No flowsheet data found.

## 2018-10-26 NOTE — Progress Notes (Signed)
Subjective:  No chest pain Mild epistaxis.  Objective:  Vital Signs in the last 24 hours: Temp:  [98.3 F (36.8 C)-98.6 F (37 C)] 98.3 F (36.8 C) (08/01 0724) Pulse Rate:  [61-69] 69 (08/01 0724) Resp:  [16-20] 16 (08/01 0724) BP: (129-145)/(70-81) 129/81 (08/01 0724) SpO2:  [96 %-98 %] 98 % (08/01 0724) Weight:  [54.6 kg] 54.6 kg (08/01 0233)  Intake/Output from previous day: 07/31 0701 - 08/01 0700 In: 520 [P.O.:240; I.V.:280] Out: 850 [Urine:850]  Physical Exam Constitutional: She is oriented to person, place, and time. She appears well-developed and well-nourished. No distress.  HENT:  Head: Normocephalic and atraumatic.  Eyes: Pupils are equal, round, and reactive to light. Conjunctivae are normal.  Neck: No JVD present.  Cardiovascular: Normal rate, regular rhythm and intact distal pulses.  No groin/wrist hematoma Pulmonary/Chest: Effort normal and breath sounds normal. She has no wheezes. She has no rales.  Abdominal: Soft. Bowel sounds are normal. There is no rebound.  Musculoskeletal:        General: No edema.  Lymphadenopathy:    She has no cervical adenopathy.  Neurological: She is alert and oriented to person, place, and time. No cranial nerve deficit.  Skin: Skin is warm and dry.  Psychiatric: She has a normal mood and affect.  Nursing note and vitals reviewed.  Lab Results: BMP Recent Labs    10/24/18 1146 10/24/18 2001 10/25/18 0314 10/26/18 0538  NA 137  --  138 139  K 4.3  --  3.7 3.6  CL 102  --  107 109  CO2 22  --  23 23  GLUCOSE 105*  --  108* 96  BUN 35*  --  20 17  CREATININE 1.41* 1.09* 1.01* 1.15*  CALCIUM 9.5  --  8.7* 8.9  GFRNONAA 34* 46* 50* 43*  GFRAA 39* 53* 58* 50*    CBC Recent Labs  Lab 10/24/18 1146  10/25/18 0314  WBC 10.4   < > 11.7*  RBC 4.57   < > 4.35  HGB 14.1   < > 13.7  HCT 43.1   < > 41.0  PLT 318   < > 306  MCV 94.3   < > 94.3  MCH 30.9   < > 31.5  MCHC 32.7   < > 33.4  RDW 13.1   < > 12.8   LYMPHSABS 1.8  --   --   MONOABS 1.5*  --   --   EOSABS 0.1  --   --   BASOSABS 0.1  --   --    < > = values in this interval not displayed.    HEMOGLOBIN A1C No results found for: HGBA1C, MPG  Cardiac Panel (last 3 results) No results for input(s): CKTOTAL, CKMB, TROPONINI, RELINDX in the last 8760 hours.  BNP (last 3 results) No results for input(s): BNP in the last 8760 hours.  TSH No results for input(s): TSH in the last 8760 hours.  Lipid Panel     Component Value Date/Time   CHOL 293 (H) 06/30/2014 0939   TRIG 224 (H) 06/30/2014 0939   HDL 63 06/30/2014 0939   CHOLHDL 4.7 06/30/2014 0939   VLDL 45 (H) 06/30/2014 0939   LDLCALC 185 (H) 06/30/2014 0939     Cardiac Studies:  Coronary angiography and intervention 10/24/2018: LM: Normal LAD: Minimal luminal irregularities Diag1 LCx: 100% prox LCx occlusion. Mid 80% stenosis. RCA: Minimal luminal irregularities RPL  Successful percutaneous coronary intervention Prox-mid LCx PTCA and  overlapping stents Synergy DES 3.0 X 28 mm & Synergy DES 2.5 X 8 mm   EKG 10/25/2018: Normal sinus rhythm Left anterior fascicular block Minimal voltage criteria for LVH, may be normal variant Nonspecific T wave abnormality  Echocardiogram pending:  Echocardiogram 2010: - Left ventricle: The cavity size was normal. There was mild   concentric hypertrophy. Systolic function was normal. The   estimated ejection fraction was in the range of 55% to 65%. Wall   motion was normal; there were no regional wall motion   abnormalities. Doppler parameters are consistent with abnormal   left ventricular relaxation (grade 1 diastolic dysfunction).  - Mitral valve: Calcified annulus. Mild regurgitation.  - Atrial septum: No defect or patent foramen ovale was identified.  - Pulmonary arteries: PA peak pressure: 44mm Hg (S).    Assessment & Recommendations:  85 y.o. Caucasian female  with hypertension, hyperlipidemia,  CKD3, arthritis  NSTEMI:  Prox 100% Lcx occlusion, likely subacute Synergy DES 3.0 X 28 mm & Synergy DES 2.5 X 8 mm  DAPT for 1 year. She had mild epistaxis overnight, likely from COVID swab related injury Given her age, will ,onitor for any recurrence today. Added metoprolol. Continue amlodipine and lisinopril.  Transfer to telemetry. Ambulate. Possible discharge tomorrow.   CKD 3: Cr improved with hydration  Hyperlipidemia: Started Crestor 40 mg daily. Continue Zetia.    Nigel Mormon, M.D. [facility], [place] Pager: [phone] Office: [phone] If no answer: [phone]

## 2018-10-26 NOTE — Discharge Summary (Signed)
Physician Discharge Summary  Patient ID: Tracy Rogers MRN: [id number] DOB/AGE: 10/30/1930 85 y.o.  Admit date: 10/24/2018 Discharge date: 10/26/2018  Primary Discharge Diagnosis: NSTEMI Hypertension Hyperlipidemia  Secondary Discharge Diagnosis: Same   Hospital Course:   85 y.o. Caucasian female  with hypertension, hyperlipidemia, CKD3, arthritis, admitted with NSTEMI from likely subacute complete occlusion of LCx. She underwent complex stenting with overlapping stents Synergy DES 3.0 X 28 mm &Synergy DES 2.5 X 8 mm.   Recovery was fairly unremarkable. Minimal epistaxis resolved by discharge. She had minimal sundowning which resolved. She ambulated without chest pain. I informed her to reduce lisinopril to 20 mg and/or amlodipine to 5 mg, should she have any dizziness.  All medications sent to her pharmacy, with specific emphasis on DAPT. Transition of care follow up arranged.    Discharge Exam: Blood pressure 129/81, pulse 69, temperature 98.3 F (36.8 C), temperature source Oral, resp. rate 16, height _0  (1.549 m), weight 54.6 kg, SpO2 98 %.   Constitutional: She isoriented to person, place, and time. She appearswell-developedand well-nourished.No distress.  HENT:  Head:Normocephalicand atraumatic.  Eyes:Pupils are equal, round, and reactive to light.Conjunctivaeare normal.  Neck:No JVDpresent.  Cardiovascular:Normal rate,regular rhythmand intact distal pulses.  No groin/wrist hematoma Pulmonary/Chest:Effort normaland breath sounds normal. She hasno wheezes. She hasno rales.  Abdominal:Soft.Bowel sounds are normal. There isno rebound.  Musculoskeletal:  General: No edema.  Lymphadenopathy:  She has no cervical adenopathy.  Neurological: She isalertand oriented to person, place, and time. Nocranial nerve deficit.  Skin: Skin iswarmand dry.  Psychiatric: She has anormal mood and affect. Nursing noteand  vitalsreviewed. Recommendations on discharge:   Significant Diagnostic Studies:  EKG08/03/2018: Normal sinus rhythm Left anterior fascicular block Minimal voltage criteria for LVH, may be normal variant Nonspecific T wave abnormality  Coronary angiography and intervention 10/24/2018: LM: Normal LAD: Minimal luminal irregularities Diag1 LCx: 100% prox LCx occlusion. Mid 80% stenosis. RCA: Minimal luminal irregularities RPL  Successful percutaneous coronary intervention Prox-mid LCx PTCA and overlapping stents Synergy DES 3.0 X 28 mm &Synergy DES 2.5 X 8 mm   Echocardiogram 10/25/2018: LVEF 60-65%. Mild hypokinesis of the left ventricular, mid-apical inferolateral wall. Grade 1 DD Mild LA dilatation. Moderate MAC. Trace AI RVSP 27 mmHg  Echocardiogram 2010: - Left ventricle: The cavity size was normal. There was mild   concentric hypertrophy. Systolic function was normal. The   estimated ejection fraction was in the range of 55% to 65%. Wall   motion was normal; there were no regional wall motion   abnormalities. Doppler parameters are consistent with abnormal   left ventricular relaxation (grade 1 diastolic dysfunction).  - Mitral valve: Calcified annulus. Mild regurgitation.  - Atrial septum: No defect or patent foramen ovale was identified.  - Pulmonary arteries: PA peak pressure: 42m Hg (S).  Labs:   Lab Results  Component Value Date   WBC 11.7 (H) 10/25/2018   HGB 13.7 10/25/2018   HCT 41.0 10/25/2018   MCV 94.3 10/25/2018   PLT 306 10/25/2018    Recent Labs  Lab 10/26/18 0538  NA 139  K 3.6  CL 109  CO2 23  BUN 17  CREATININE 1.15*  CALCIUM 8.9  GLUCOSE 96    Lipid Panel     Component Value Date/Time   CHOL 293 (H) 06/30/2014 0939   TRIG 224 (H) 06/30/2014 0939   HDL 63 06/30/2014 0939   CHOLHDL 4.7 06/30/2014 0939   VLDL 45 (H) 06/30/2014 0939   LDLCALC 185 (H) 06/30/2014 07517  HS Trop peak 3,250  ng/mL  Radiology: Dg Chest Portable 1 View  Result Date: 10/24/2018 CLINICAL DATA:  Chest is comfort for 2-3 days. EXAM: PORTABLE CHEST 1 VIEW COMPARISON:  Chest x-rays dated 04/25/2014 and 11/07/2010. FINDINGS: Heart size and mediastinal contours are stable. Lungs are clear. No pleural effusion or pneumothorax seen. Osseous structures about the chest are unremarkable. IMPRESSION: No active disease. No evidence of pneumonia or pulmonary edema. Electronically Signed   By: Franki Cabot M.D.   On: 10/24/2018 12:02      FOLLOW UP PLANS AND APPOINTMENTS Discharge Instructions    AMB Referral to Cardiac Rehabilitation - Phase II   Complete by: As directed    Diagnosis:  NSTEMI Coronary Stents     After initial evaluation and assessments completed: Virtual Based Care may be provided alone or in conjunction with Phase 2 Cardiac Rehab based on patient barriers.: Yes   Face-to-face encounter (required for Medicare/Medicaid patients)   Complete by: As directed    I Amiir Heckard J Krishan Mcbreen certify that this patient is under my care and that I, or a nurse practitioner or physician's assistant working with me, had a face-to-face encounter that meets the physician face-to-face encounter requirements with this patient on 10/26/2018. The encounter with the patient was in whole, or in part for the following medical condition(s) which is the primary reason for home health care (List medical condition): Coronary artery disease, gait instability   The encounter with the patient was in whole, or in part, for the following medical condition, which is the primary reason for home health care: Coronary artery disease, gait instability   I certify that, based on my findings, the following services are medically necessary home health services: Physical therapy   Reason for Medically Necessary Home Health Services: Therapy- Home Adaptation to Facilitate Safety   My clinical findings support the need for the above services:  OTHER SEE COMMENTS   Further, I certify that my clinical findings support that this patient is homebound due to: Unsafe ambulation due to balance issues   Home Health   Complete by: As directed    To provide the following care/treatments: PT     Allergies as of 10/26/2018      Reactions   Penicillins       Medication List    TAKE these medications   amLODipine 5 MG tablet Commonly known as: NORVASC Take 1 tab daily   aspirin 81 MG EC tablet Take 1 tablet (81 mg total) by mouth daily.   Bilberry 1000 MG Caps Take 1,000 mg by mouth daily.   Biotin 5 MG Caps Take by mouth.   Chlorhexidine Gluconate Cloth 2 % Pads Apply 6 each topically daily.   CHOLEST OFF PO Take by mouth.   co-enzyme Q-10 30 MG capsule Take 200 mg by mouth daily.   ezetimibe 10 MG tablet Commonly known as: ZETIA daily.   lisinopril 20 MG tablet Commonly known as: ZESTRIL Take 1.5 tablets (30 mg total) by mouth daily.   metoprolol tartrate 25 MG tablet Commonly known as: LOPRESSOR Take 1 tablet (25 mg total) by mouth 2 (two) times daily.   multivitamin capsule Take 1 capsule by mouth daily.   multivitamin-lutein Caps capsule Take 1 capsule by mouth daily.   nitroGLYCERIN 0.4 MG SL tablet Commonly known as: NITROSTAT Place 1 tablet (0.4 mg total) under the tongue every 5 (five) minutes as needed for chest pain (CP or SOB).   pantoprazole 40 MG tablet Commonly  known as: PROTONIX Take 1 tablet (40 mg total) by mouth daily.   rosuvastatin 40 MG tablet Commonly known as: Crestor Take 1 tablet (40 mg total) by mouth at bedtime.   ticagrelor 90 MG Tabs tablet Commonly known as: BRILINTA Take 1 tablet (90 mg total) by mouth 2 (two) times daily.   traMADol 50 MG tablet Commonly known as: ULTRAM Take 1 tablet (50 mg total) by mouth every 6 (six) hours as needed.      Follow-up Information    Miquel Dunn, NP Follow up on 11/04/2018.   Specialty: Cardiology Why: 2:00  PM  Contact information: [place] [address] [phone]        Lanice Shirts, MD.   Specialty: Internal Medicine Contact information: [address] [place] [address] [phone]        [facility], [place] Follow up.   Contact information: [address] [place] [address] [phone]            Vernell Leep MD, [facility] Pager: [phone] Office: [phone] If no answer: [phone]

## 2018-10-26 NOTE — Progress Notes (Signed)
CARDIAC REHAB PHASE I   PRE:  Rate/Rhythm: 61 SR  BP:  Supine:   Sitting: 138/81  Standing:    SaO2: 98% RA  MODE:  Ambulation: 400 ft   POST:  Rate/Rhythm: 71 SR  BP:  Supine:   Sitting: 150/93  Standing:    SaO2: 97% RA Tolerated ambulation well independently.  Education completed re: home exercise guidelines, activity restrictions, angina symptoms, and NTG use.  Referred to phase II cardiac rehab in [place]. 1005-1050  Liliane Channel RN, BSN 10/26/2018 10:48 AM

## 2018-10-26 NOTE — Progress Notes (Signed)
CRITICAL CARE Performed by: Vernell Leep   Total critical care time: 55 minutes   Critical care time was exclusive of separately billable procedures and treating other patients.   Critical care was necessary to treat or prevent imminent or life-threatening deterioration.   Critical care was time spent personally by me on the following activities: development of treatment plan with patient and/or surrogate as well as nursing, discussions with consultants, evaluation of patient's response to treatment, examination of patient, obtaining history from patient or surrogate, ordering and performing treatments and interventions, ordering and review of laboratory studies, ordering and review of radiographic studies, pulse oximetry and re-evaluation of patient's condition.

## 2018-10-28 ENCOUNTER — Encounter (HOSPITAL_COMMUNITY): Payer: Self-pay | Admitting: Cardiology

## 2018-10-28 ENCOUNTER — Telehealth: Payer: Self-pay

## 2018-10-28 NOTE — Telephone Encounter (Signed)
Location of hospitalization: [facility] Reason for hospitalization: NSTEMI  Date of discharge: 10/26/2018 Date of first communication with patient: today Person contacting patient: Cheri Kearns RMA Current symptoms: Pt is feeling fine; BP is well controlled and heart rate has been 68-70 Do you understand why you were in the Hospital: Yes Questions regarding discharge instructions: None Where were you discharged to: Home Medications reviewed: Yes Allergies reviewed: Yes Dietary changes reviewed: Yes. Discussed low fat and low salt diet.  Referals reviewed: NA Activities of Daily Living: Able to with mild limitations Any transportation issues/concerns: None Any patient concerns: None Confirmed importance & date/time of Follow up appt: Yes Confirmed with patient if condition begins to worsen call. Pt was given the office number and encouraged to call back with questions or concerns: Yes

## 2018-10-29 ENCOUNTER — Telehealth (HOSPITAL_COMMUNITY): Payer: Self-pay

## 2018-10-29 NOTE — Telephone Encounter (Signed)
Pt insurance is active and benefits verified through [facility] $45.00, DED 0/0 met, out of pocket $4,200/$100 met, co-insurance 0. no pre-authorization required. [name], 10/29/2018 @ 11:12am, REF# [id number]  Will contact patient to see if she is interested in the Cardiac Rehab Program. If interested, patient will need to complete follow up appt. Once completed, patient will be contacted for scheduling upon review by the RN Navigator.

## 2018-11-01 DIAGNOSIS — M810 Age-related osteoporosis without current pathological fracture: Secondary | ICD-10-CM | POA: Diagnosis not present

## 2018-11-01 DIAGNOSIS — E559 Vitamin D deficiency, unspecified: Secondary | ICD-10-CM | POA: Diagnosis not present

## 2018-11-01 DIAGNOSIS — N183 Chronic kidney disease, stage 3 (moderate): Secondary | ICD-10-CM | POA: Diagnosis not present

## 2018-11-01 DIAGNOSIS — Z955 Presence of coronary angioplasty implant and graft: Secondary | ICD-10-CM | POA: Diagnosis not present

## 2018-11-01 DIAGNOSIS — Z09 Encounter for follow-up examination after completed treatment for conditions other than malignant neoplasm: Secondary | ICD-10-CM | POA: Diagnosis not present

## 2018-11-01 DIAGNOSIS — I214 Non-ST elevation (NSTEMI) myocardial infarction: Secondary | ICD-10-CM | POA: Diagnosis not present

## 2018-11-04 ENCOUNTER — Ambulatory Visit (INDEPENDENT_AMBULATORY_CARE_PROVIDER_SITE_OTHER): Payer: Medicare HMO | Admitting: Cardiology

## 2018-11-04 ENCOUNTER — Other Ambulatory Visit: Payer: Self-pay

## 2018-11-04 ENCOUNTER — Encounter: Payer: Self-pay | Admitting: Cardiology

## 2018-11-04 VITALS — BP 143/80 | HR 67 | Ht 61.0 in | Wt 123.3 lb

## 2018-11-04 DIAGNOSIS — I252 Old myocardial infarction: Secondary | ICD-10-CM

## 2018-11-04 DIAGNOSIS — I10 Essential (primary) hypertension: Secondary | ICD-10-CM

## 2018-11-04 DIAGNOSIS — E785 Hyperlipidemia, unspecified: Secondary | ICD-10-CM | POA: Diagnosis not present

## 2018-11-04 DIAGNOSIS — I2510 Atherosclerotic heart disease of native coronary artery without angina pectoris: Secondary | ICD-10-CM

## 2018-11-04 MED ORDER — TICAGRELOR 90 MG PO TABS: 90.0000 mg | ORAL_TABLET | Freq: Two times a day (BID) | ORAL | 2 refills | Status: DC

## 2018-11-04 MED ORDER — ROSUVASTATIN CALCIUM 10 MG PO TABS: 10.0000 mg | ORAL_TABLET | Freq: Every day | ORAL | 2 refills | Status: DC

## 2018-11-04 NOTE — Progress Notes (Addendum)
Primary Physician:  Vernie Shanks, MD   Patient ID: Tracy Rogers, female    DOB: [birth year], 85 y.o.   MRN: [id number]  Subjective:    Chief Complaint  Patient presents with  . Hypertension  . Follow-up    HPI: Tracy Rogers  is a 85 y.o. female  with hypertension, hyperlipidemia, CKD3, arthritis, admitted with NSTEMI from likely subacute complete occlusion of LCx. She underwent complex stenting with overlapping stents Synergy DES 3.0 X 28 mm &Synergy DES 2.5 X 8 mm.  She is doing well since discharge. She does get tired after 3-4 hours, but overall doing well. No chest pain or dyspnea. No more hallucinations or dizziness. Tolerating medications well. She is wondering if she can stop her Pantoprazole. Also wanting to take tylenol PM to help with sleep.   Blood pressure has been well controlled. She is on zetia for hyperlipidemia. She is not on statin therapy as she had read side effects on statins and preferred to change her diet to help with her cholesterol.    Past Medical History:  Diagnosis Date  . Arthritis   . Hyperlipidemia   . Hypertension   . Lumbar herniated disc   . Osteopenia   . Osteoporosis   . Sciatica   . Spinal stenosis     Past Surgical History:  Procedure Laterality Date  . CORONARY STENT INTERVENTION N/A 10/24/2018   Procedure: CORONARY STENT INTERVENTION;  Surgeon: Nigel Mormon, MD;  Location: [facility];  Service: Cardiovascular;  Laterality: N/A;  . FRACTURE SURGERY Right    wrist, plate inserted  . LEFT HEART CATH AND CORONARY ANGIOGRAPHY N/A 10/24/2018   Procedure: LEFT HEART CATH AND CORONARY ANGIOGRAPHY;  Surgeon: Nigel Mormon, MD;  Location: [facility];  Service: Cardiovascular;  Laterality: N/A;  . lower back surgery  2012   Hernia disk    Social History   Socioeconomic History  . Marital status: Single    Spouse name: Not on file  . Number of children: 0  . Years of education: Not on  file  . Highest education level: Not on file  Occupational History  . Occupation: Retired  Scientific laboratory technician  . Financial resource strain: Not on file  . Food insecurity    Worry: Not on file    Inability: Not on file  . Transportation needs    Medical: Not on file    Non-medical: Not on file  Tobacco Use  . Smoking status: Never Smoker  . Smokeless tobacco: Never Used  Substance and Sexual Activity  . Alcohol use: Yes    Alcohol/week: 0.0 - 1.0 standard drinks    Comment: rare  . Drug use: No  . Sexual activity: Never  Lifestyle  . Physical activity    Days per week: Not on file    Minutes per session: Not on file  . Stress: Not on file  Relationships  . Social Herbalist on phone: Not on file    Gets together: Not on file    Attends religious service: Not on file    Active member of club or organization: Not on file    Attends meetings of clubs or organizations: Not on file    Relationship status: Not on file  . Intimate partner violence    Fear of current or ex partner: Not on file    Emotionally abused: Not on file    Physically abused: Not on file  Forced sexual activity: Not on file  Other Topics Concern  . Not on file  Social History Narrative  . Not on file    Review of Systems  Constitution: Positive for malaise/fatigue. Negative for decreased appetite, weight gain and weight loss.  Eyes: Negative for visual disturbance.  Cardiovascular: Negative for chest pain, claudication, dyspnea on exertion, leg swelling, orthopnea, palpitations and syncope.  Respiratory: Negative for hemoptysis and wheezing.   Endocrine: Negative for cold intolerance and heat intolerance.  Hematologic/Lymphatic: Does not bruise/bleed easily.  Skin: Negative for nail changes.  Musculoskeletal: Negative for muscle weakness and myalgias.  Gastrointestinal: Negative for abdominal pain, change in bowel habit, nausea and vomiting.  Neurological: Negative for difficulty with  concentration, dizziness, focal weakness and headaches.  Psychiatric/Behavioral: Negative for altered mental status and suicidal ideas.  All other systems reviewed and are negative.     Objective:  Blood pressure (!) 143/80, pulse 67, height _0  (1.549 m), weight 123 lb 4.8 oz (55.9 kg), SpO2 98 %. Body mass index is 23.3 kg/m.    Physical Exam  Constitutional: She is oriented to person, place, and time. Vital signs are normal. She appears well-developed and well-nourished.  HENT:  Head: Normocephalic and atraumatic.  Neck: Normal range of motion.  Cardiovascular: Normal rate, regular rhythm, normal heart sounds and intact distal pulses.  Pulmonary/Chest: Effort normal and breath sounds normal. No accessory muscle usage. No respiratory distress.  Abdominal: Soft. Bowel sounds are normal.  Musculoskeletal: Normal range of motion.  Neurological: She is alert and oriented to person, place, and time.  Skin: Skin is warm and dry.  Vitals reviewed.  Radiology: No results found.  Laboratory examination:   02/11/2018: Cholesterol 216, triglycerides 141, HDL 72, LDL 116.  08/20/2018: Cholesterol 229, triglycerides 121, HDL 75, LDL 129.  CMP Latest Ref Rng & Units 10/26/2018 10/25/2018 10/24/2018  Glucose 70 - 99 mg/dL 96 108(H) -  BUN 8 - 23 mg/dL 17 20 -  Creatinine 0.44 - 1.00 mg/dL 1.15(H) 1.01(H) 1.09(H)  Sodium 135 - 145 mmol/L 139 138 -  Potassium 3.5 - 5.1 mmol/L 3.6 3.7 -  Chloride 98 - 111 mmol/L 109 107 -  CO2 22 - 32 mmol/L 23 23 -  Calcium 8.9 - 10.3 mg/dL 8.9 8.7(L) -  Total Protein 6.0 - 8.3 g/dL - - -  Total Bilirubin 0.2 - 1.2 mg/dL - - -  Alkaline Phos 39 - 117 U/L - - -  AST 0 - 37 U/L - - -  ALT 0 - 35 U/L - - -   CBC Latest Ref Rng & Units 10/25/2018 10/24/2018 10/24/2018  WBC 4.0 - 10.5 K/uL 11.7(H) 12.9(H) 10.4  Hemoglobin 12.0 - 15.0 g/dL 13.7 14.2 14.1  Hematocrit 36.0 - 46.0 % 41.0 41.4 43.1  Platelets 150 - 400 K/uL 306 308 318   Lipid Panel      Component Value Date/Time   CHOL 293 (H) 06/30/2014 0939   TRIG 224 (H) 06/30/2014 0939   HDL 63 06/30/2014 0939   CHOLHDL 4.7 06/30/2014 0939   VLDL 45 (H) 06/30/2014 0939   LDLCALC 185 (H) 06/30/2014 0939   HEMOGLOBIN A1C No results found for: HGBA1C, MPG TSH No results for input(s): TSH in the last 8760 hours.  PRN Meds:. Medications Discontinued During This Encounter  Medication Reason  . Plant Sterols and Stanols (CHOLEST OFF PO) Error  . rosuvastatin (CRESTOR) 40 MG tablet Error  . traMADol (ULTRAM) 50 MG tablet Error  . Chlorhexidine Gluconate  Cloth 2 % PADS Error  . Biotin 5 MG CAPS Error  . ticagrelor (BRILINTA) 90 MG TABS tablet Reorder   Current Meds  Medication Sig  . amLODipine (NORVASC) 5 MG tablet Take 1 tab daily  . Ascorbic Acid (VITA-C PO) Take by mouth daily.  Marland Kitchen aspirin EC 81 MG EC tablet Take 1 tablet (81 mg total) by mouth daily.  Marland Kitchen b complex vitamins tablet Take 1 tablet by mouth daily.  . Bilberry 1000 MG CAPS Take 1,000 mg by mouth daily.  . Biotin w/ Vitamins C & E (HAIR/SKIN/NAILS PO) Take by mouth daily.  . Cholecalciferol (VITAMIN D3) 50 MCG (2000 UT) capsule Take 2,000 Units by mouth daily.  Marland Kitchen co-enzyme Q-10 30 MG capsule Take by mouth daily.   Marland Kitchen ezetimibe (ZETIA) 10 MG tablet daily.  Marland Kitchen GARLIC PO Take by mouth daily.  Marland Kitchen LECITHIN PO Take by mouth daily.  Marland Kitchen lisinopril (PRINIVIL,ZESTRIL) 20 MG tablet Take 1.5 tablets (30 mg total) by mouth daily.  . Magnesium 500 MG CAPS Take by mouth daily.  . metoprolol tartrate (LOPRESSOR) 25 MG tablet Take 1 tablet (25 mg total) by mouth 2 (two) times daily.  . Multiple Vitamin (MULTIVITAMIN) capsule Take 1 capsule by mouth daily.  . multivitamin-lutein (OCUVITE-LUTEIN) CAPS capsule Take 1 capsule by mouth daily.  . nitroGLYCERIN (NITROSTAT) 0.4 MG SL tablet Place 1 tablet (0.4 mg total) under the tongue every 5 (five) minutes as needed for chest pain (CP or SOB).  . pantoprazole (PROTONIX) 40 MG tablet Take 1  tablet (40 mg total) by mouth daily.  . ticagrelor (BRILINTA) 90 MG TABS tablet Take 1 tablet (90 mg total) by mouth 2 (two) times daily.  . Turmeric (QC TUMERIC COMPLEX PO) Take by mouth daily.  . [DISCONTINUED] ticagrelor (BRILINTA) 90 MG TABS tablet Take 1 tablet (90 mg total) by mouth 2 (two) times daily.    Cardiac Studies:   Coronary angiography and intervention 10/24/2018: LM: Normal LAD: Minimal luminal irregularities Diag1 LCx: 100% prox LCx occlusion. Mid 80% stenosis. RCA: Minimal luminal irregularities RPL  Successful percutaneous coronary intervention Prox-mid LCx PTCA and overlapping stents Synergy DES 3.0 X 28 mm &Synergy DES 2.5 X 8 mm   Echocardiogram 10/25/2018: LVEF 60-65%. Mild hypokinesis of the left ventricular, mid-apical inferolateral wall. Grade 1 DD Mild LA dilatation. Moderate MAC. Trace AI RVSP 27 mmHg  Assessment:     ICD-10-CM   1. Atherosclerosis of native coronary artery of native heart without angina pectoris  I25.10 EKG 12-Lead  2. History of non-ST elevation myocardial infarction (NSTEMI)  I25.2   3. Essential hypertension  I10   4. Hyperlipidemia LDL goal <70  E78.5 CANCELED: Lipid Profile    EKG08/12/2018: Normal sinus rhythm Left anterior fascicular block Minimal voltage criteria for LVH, may be normal variant Nonspecific T wave abnormality  Recommendations:   Patient is doing well since being discharged from the hospital. She has not had recurrence of chest pain or dyspnea. She does feel tired after a few hours, which I have advised her will likely improve with time. I have encouraged her to start cardiac rehab, but she is reluctant about doing this as she does not want to have to deal with parking issues, but states she will consider this. She has brought her home BP readings today, which are very well controlled. No dizziness or hallucinations. She is on DAPT and tolerates well, advised that she will need to continue for at least  one year. Encouraged her  to continue with Pantoprazole to help with prevention of GI bleeding given her DAPT. Also okay to use tylenol PM as needed to help with sleep. Right radial access site healing well, no hematoma.  EKG is unchanged and stable. I have discussed the benefits of statin therapy and feel that this is her best option. Previously her LDL was 185, although she states this has improved with diet changes, I do not feel that this will be able to achieve her goal of less than 70. She is willing to try statin. Will start low dose Crestor. She will need repeat lipids before her next office visit. I will see her back in 3 months for follow up.   Addendum: LDL in May 2020 was 129, would still recommend Crestor therapy.  Miquel Dunn, MSN, APRN, FNP-C [facility]. [place] Office: [phone] Fax: [phone]

## 2018-11-12 ENCOUNTER — Other Ambulatory Visit: Payer: Self-pay

## 2018-11-16 ENCOUNTER — Other Ambulatory Visit: Payer: Self-pay | Admitting: Cardiology

## 2018-11-22 NOTE — Telephone Encounter (Signed)
From pt

## 2018-11-22 NOTE — Telephone Encounter (Signed)
Please call patient to get her Brilinta 90 mg samples and work on patient assistance.

## 2018-11-25 NOTE — Telephone Encounter (Signed)
Pt was given samples and she is going to print out paper work for pt assistance

## 2018-11-28 DIAGNOSIS — E559 Vitamin D deficiency, unspecified: Secondary | ICD-10-CM | POA: Diagnosis not present

## 2018-11-28 DIAGNOSIS — Z955 Presence of coronary angioplasty implant and graft: Secondary | ICD-10-CM | POA: Diagnosis not present

## 2018-11-28 DIAGNOSIS — N183 Chronic kidney disease, stage 3 (moderate): Secondary | ICD-10-CM | POA: Diagnosis not present

## 2018-11-28 DIAGNOSIS — M810 Age-related osteoporosis without current pathological fracture: Secondary | ICD-10-CM | POA: Diagnosis not present

## 2018-11-28 DIAGNOSIS — E782 Mixed hyperlipidemia: Secondary | ICD-10-CM | POA: Diagnosis not present

## 2018-11-28 DIAGNOSIS — I10 Essential (primary) hypertension: Secondary | ICD-10-CM | POA: Diagnosis not present

## 2018-11-28 DIAGNOSIS — I214 Non-ST elevation (NSTEMI) myocardial infarction: Secondary | ICD-10-CM | POA: Diagnosis not present

## 2018-11-29 NOTE — Telephone Encounter (Signed)
From patienr

## 2018-12-04 NOTE — Telephone Encounter (Signed)
Please read

## 2018-12-09 DIAGNOSIS — C44311 Basal cell carcinoma of skin of nose: Secondary | ICD-10-CM | POA: Diagnosis not present

## 2018-12-09 DIAGNOSIS — Z85828 Personal history of other malignant neoplasm of skin: Secondary | ICD-10-CM | POA: Diagnosis not present

## 2019-01-23 ENCOUNTER — Other Ambulatory Visit: Payer: Self-pay

## 2019-01-28 NOTE — Telephone Encounter (Signed)
Please read

## 2019-02-05 DIAGNOSIS — R69 Illness, unspecified: Secondary | ICD-10-CM | POA: Diagnosis not present

## 2019-02-10 DIAGNOSIS — R69 Illness, unspecified: Secondary | ICD-10-CM | POA: Diagnosis not present

## 2019-02-14 ENCOUNTER — Telehealth: Payer: Self-pay

## 2019-02-14 ENCOUNTER — Encounter: Payer: Self-pay | Admitting: Cardiology

## 2019-02-14 ENCOUNTER — Ambulatory Visit (INDEPENDENT_AMBULATORY_CARE_PROVIDER_SITE_OTHER): Payer: Medicare HMO | Admitting: Cardiology

## 2019-02-14 ENCOUNTER — Other Ambulatory Visit: Payer: Self-pay

## 2019-02-14 VITALS — BP 163/86 | HR 65 | Temp 98.0°F | Ht 61.0 in | Wt 121.6 lb

## 2019-02-14 DIAGNOSIS — I2510 Atherosclerotic heart disease of native coronary artery without angina pectoris: Secondary | ICD-10-CM | POA: Diagnosis not present

## 2019-02-14 DIAGNOSIS — I252 Old myocardial infarction: Secondary | ICD-10-CM | POA: Diagnosis not present

## 2019-02-14 DIAGNOSIS — E785 Hyperlipidemia, unspecified: Secondary | ICD-10-CM

## 2019-02-14 DIAGNOSIS — I10 Essential (primary) hypertension: Secondary | ICD-10-CM | POA: Diagnosis not present

## 2019-02-14 HISTORY — DX: Atherosclerotic heart disease of native coronary artery without angina pectoris: I25.10

## 2019-02-14 MED ORDER — TICAGRELOR 90 MG PO TABS: 90.0000 mg | ORAL_TABLET | Freq: Two times a day (BID) | ORAL | 4 refills | Status: DC

## 2019-02-14 NOTE — Progress Notes (Signed)
Follow up visit  Subjective:   Tracy Rogers, female    DOB: [birth year], 85 y.o.   MRN: [id number]  Chief Complaint  Patient presents with  . Coronary Artery Disease    follow up    HPI  85 y.o. Caucasian female with hypertension, hyperlipidemia, CKD3, arthritis, CAD, NSTEMI 09/2018, subacute LCx occlusion treated with complex PCI (overlapping stents Synergy DES 3.0 X 28 mm &Synergy DES 2.5 X 8 mm)   Patient is doing well, denies chest pain, shortness of breath, palpitations, leg edema, orthopnea, PND, TIA/syncope.  She is going to undergo blood work including lipid panel through her PCP next week.  Blood pressure is elevated today.  However, patient's home monitor lab shows normal blood pressures.  Patient reports that blood pressure is always elevated at doctor's visits.  She does not have a blood pressure monitor with her today.  Past Medical History:  Diagnosis Date  . Arthritis   . Hyperlipidemia   . Hypertension   . Lumbar herniated disc   . Osteopenia   . Osteoporosis   . Sciatica   . Spinal stenosis      Past Surgical History:  Procedure Laterality Date  . CORONARY STENT INTERVENTION N/A 10/24/2018   Procedure: CORONARY STENT INTERVENTION;  Surgeon: Nigel Mormon, MD;  Location: [facility];  Service: Cardiovascular;  Laterality: N/A;  . FRACTURE SURGERY Right    wrist, plate inserted  . LEFT HEART CATH AND CORONARY ANGIOGRAPHY N/A 10/24/2018   Procedure: LEFT HEART CATH AND CORONARY ANGIOGRAPHY;  Surgeon: Nigel Mormon, MD;  Location: [facility];  Service: Cardiovascular;  Laterality: N/A;  . lower back surgery  2012   Hernia disk     Social History   Socioeconomic History  . Marital status: Single    Spouse name: Not on file  . Number of children: 0  . Years of education: Not on file  . Highest education level: Not on file  Occupational History  . Occupation: Retired  Scientific laboratory technician  . Financial resource strain: Not  on file  . Food insecurity    Worry: Not on file    Inability: Not on file  . Transportation needs    Medical: Not on file    Non-medical: Not on file  Tobacco Use  . Smoking status: Never Smoker  . Smokeless tobacco: Never Used  Substance and Sexual Activity  . Alcohol use: Yes    Alcohol/week: 0.0 - 1.0 standard drinks    Comment: rare  . Drug use: No  . Sexual activity: Never  Lifestyle  . Physical activity    Days per week: Not on file    Minutes per session: Not on file  . Stress: Not on file  Relationships  . Social Herbalist on phone: Not on file    Gets together: Not on file    Attends religious service: Not on file    Active member of club or organization: Not on file    Attends meetings of clubs or organizations: Not on file    Relationship status: Not on file  . Intimate partner violence    Fear of current or ex partner: Not on file    Emotionally abused: Not on file    Physically abused: Not on file    Forced sexual activity: Not on file  Other Topics Concern  . Not on file  Social History Narrative  . Not on file  Family History  Problem Relation Age of Onset  . Dementia Mother   . Addison's disease Sister   . Heart disease Father   . Colon cancer Neg Hx   . Colon polyps Neg Hx   . Kidney disease Neg Hx   . Diabetes Neg Hx   . Esophageal cancer Neg Hx   . Gallbladder disease Neg Hx      Current Outpatient Medications on File Prior to Visit  Medication Sig Dispense Refill  . Ascorbic Acid (VITA-C PO) Take by mouth daily.    Marland Kitchen aspirin EC 81 MG EC tablet Take 1 tablet (81 mg total) by mouth daily. 30 tablet 2  . b complex vitamins tablet Take 1 tablet by mouth daily.    . Bilberry 1000 MG CAPS Take 1,000 mg by mouth daily.    . Biotin w/ Vitamins C & E (HAIR/SKIN/NAILS PO) Take by mouth daily.    . Cholecalciferol (VITAMIN D3) 50 MCG (2000 UT) capsule Take 2,000 Units by mouth daily.    Marland Kitchen co-enzyme Q-10 30 MG capsule Take by mouth  daily.     Marland Kitchen ezetimibe (ZETIA) 10 MG tablet daily.    Marland Kitchen GARLIC PO Take by mouth daily.    Marland Kitchen LECITHIN PO Take by mouth daily.    Marland Kitchen lisinopril (PRINIVIL,ZESTRIL) 20 MG tablet Take 1.5 tablets (30 mg total) by mouth daily. (Patient taking differently: Take 0.5 mg by mouth daily. ) 135 tablet 1  . Magnesium 500 MG CAPS Take by mouth daily.    . metoprolol tartrate (LOPRESSOR) 25 MG tablet TAKE 1 TABLET BY MOUTH TWICE A DAY 180 tablet 0  . Multiple Vitamin (MULTIVITAMIN) capsule Take 1 capsule by mouth daily.    . multivitamin-lutein (OCUVITE-LUTEIN) CAPS capsule Take 1 capsule by mouth daily.    . pantoprazole (PROTONIX) 40 MG tablet TAKE 1 TABLET BY MOUTH EVERY DAY 90 tablet 0  . rosuvastatin (CRESTOR) 10 MG tablet TAKE 1 TABLET BY MOUTH EVERY DAY 90 tablet 0  . ticagrelor (BRILINTA) 90 MG TABS tablet Take 1 tablet (90 mg total) by mouth 2 (two) times daily. 180 tablet 2  . Turmeric (QC TUMERIC COMPLEX PO) Take by mouth daily.    Marland Kitchen Ubiquinol 100 MG CAPS Take by mouth.    . nitroGLYCERIN (NITROSTAT) 0.4 MG SL tablet Place 1 tablet (0.4 mg total) under the tongue every 5 (five) minutes as needed for chest pain (CP or SOB). 30 tablet 2   No current facility-administered medications on file prior to visit.     Cardiovascular studies:  EKG 11/04/2018: Sinus rhythm at 68 bpm.  Left anterior fascicular block.  Coronary angiography and intervention 10/24/2018: LM: Normal LAD: Minimal luminal irregularities Diag1 LCx: 100% prox LCx occlusion. Mid 80% stenosis. RCA: Minimal luminal irregularities RPL  Successful percutaneous coronary intervention Prox-mid LCx PTCA and overlapping stents Synergy DES 3.0 X 28 mm &Synergy DES 2.5 X 8 mm   Echocardiogram 10/25/2018: LVEF 60-65%. Mild hypokinesis of the left ventricular, mid-apical inferolateral wall. Grade 1 DD Mild LA dilatation. Moderate MAC. Trace AI RVSP 27 mmHg  Recent labs: Results for KWANZA, CANCELLIERE (MRN [id number]) as  of 02/14/2019 10:16  Ref. Range 10/26/2018 05:38  Sodium Latest Ref Range: 135 - 145 mmol/L 139  Potassium Latest Ref Range: 3.5 - 5.1 mmol/L 3.6  Chloride Latest Ref Range: 98 - 111 mmol/L 109  CO2 Latest Ref Range: 22 - 32 mmol/L 23  Glucose Latest Ref Range: 70 - 99 mg/dL 96  BUN Latest Ref Range: 8 - 23 mg/dL 17  Creatinine Latest Ref Range: 0.44 - 1.00 mg/dL 1.15 (H)  Calcium Latest Ref Range: 8.9 - 10.3 mg/dL 8.9  Anion gap Latest Ref Range: 5 - 15  7  GFR, Est Non African American Latest Ref Range: >60 mL/min 43 (L)  GFR, Est African American Latest Ref Range: >60 mL/min 50 (L)   Results for DUSTINA, SCOGGIN (MRN [id number]) as of 02/14/2019 10:16  Ref. Range 10/25/2018 03:14  WBC Latest Ref Range: 4.0 - 10.5 K/uL 11.7 (H)  RBC Latest Ref Range: 3.87 - 5.11 MIL/uL 4.35  Hemoglobin Latest Ref Range: 12.0 - 15.0 g/dL 13.7  HCT Latest Ref Range: 36.0 - 46.0 % 41.0  MCV Latest Ref Range: 80.0 - 100.0 fL 94.3  MCH Latest Ref Range: 26.0 - 34.0 pg 31.5  MCHC Latest Ref Range: 30.0 - 36.0 g/dL 33.4  RDW Latest Ref Range: 11.5 - 15.5 % 12.8  Platelets Latest Ref Range: 150 - 400 K/uL 306  nRBC Latest Ref Range: 0.0 - 0.2 % 0.0   Lipid panel 08/20/2018: Cholesterol 229, triglycerides 121, HDL 75, LDL 129.    Review of Systems  Constitution: Negative for decreased appetite, malaise/fatigue, weight gain and weight loss.  HENT: Negative for congestion.   Eyes: Negative for visual disturbance.  Cardiovascular: Negative for chest pain, dyspnea on exertion, leg swelling, palpitations and syncope.  Respiratory: Negative for cough.   Endocrine: Negative for cold intolerance.  Hematologic/Lymphatic: Does not bruise/bleed easily.  Skin: Negative for itching and rash.  Musculoskeletal: Negative for myalgias.  Gastrointestinal: Negative for abdominal pain, nausea and vomiting.  Genitourinary: Negative for dysuria.  Neurological: Negative for dizziness and weakness.   Psychiatric/Behavioral: The patient is not nervous/anxious.   All other systems reviewed and are negative.       Vitals:   02/14/19 1331  BP: (!) 168/89  Pulse: 68  Temp: 98 F (36.7 C)  SpO2: 98%     Body mass index is 22.98 kg/m. Filed Weights   02/14/19 1331  Weight: 121 lb 9.6 oz (55.2 kg)     Objective:   Physical Exam  Constitutional: She is oriented to person, place, and time. She appears well-developed and well-nourished. No distress.  HENT:  Head: Normocephalic and atraumatic.  Eyes: Pupils are equal, round, and reactive to light. Conjunctivae are normal.  Neck: No JVD present.  Cardiovascular: Normal rate, regular rhythm and intact distal pulses.  Pulmonary/Chest: Effort normal and breath sounds normal. She has no wheezes. She has no rales.  Abdominal: Soft. Bowel sounds are normal. There is no rebound.  Musculoskeletal:        General: No edema.  Lymphadenopathy:    She has no cervical adenopathy.  Neurological: She is alert and oriented to person, place, and time. No cranial nerve deficit.  Skin: Skin is warm and dry.  Psychiatric: She has a normal mood and affect.  Nursing note and vitals reviewed.         Assessment & Recommendations:    85 y.o. Caucasian female with hypertension, hyperlipidemia, CKD3, arthritis, CAD, NSTEMI 09/2018  CAD: NSTEMI 09/2018 fromSubacute LCx occlusion treated with complex PCI (overlapping stents Synergy DES 3.0 X 28 mm &Synergy DES 2.5 X 8 mm). No recurrent anginal symptoms.  Continue dual antiplatelet therapy with aspirin and Brilinta till 10/14/2019. Continue risk factor modification. Blood pressure usually well controlled, according to patient's blood pressure log.  I encouraged her to carry her blood pressure monitor  to her doctor visits.  No change made to her antihypertensive therapy today.  Currently on lisinopril 10 mg daily, metoprolol 25 mg twice daily, amlodipine 5 mg daily. She is currently on Crestor 10  mg daily.  Upcoming lipid panel with PCP next week.  If LDL >70, recommend increasing Crestor to 20 or 40 mg daily.  Follow-up in 6 months.    Nigel Mormon, MD [facility] [place] Pager: [phone] Office: [phone]

## 2019-02-14 NOTE — Telephone Encounter (Signed)
Completed.

## 2019-02-17 DIAGNOSIS — Z955 Presence of coronary angioplasty implant and graft: Secondary | ICD-10-CM | POA: Diagnosis not present

## 2019-02-17 DIAGNOSIS — M810 Age-related osteoporosis without current pathological fracture: Secondary | ICD-10-CM | POA: Diagnosis not present

## 2019-02-17 DIAGNOSIS — E782 Mixed hyperlipidemia: Secondary | ICD-10-CM | POA: Diagnosis not present

## 2019-02-17 DIAGNOSIS — N183 Chronic kidney disease, stage 3 unspecified: Secondary | ICD-10-CM | POA: Diagnosis not present

## 2019-02-17 DIAGNOSIS — E559 Vitamin D deficiency, unspecified: Secondary | ICD-10-CM | POA: Diagnosis not present

## 2019-02-17 DIAGNOSIS — I214 Non-ST elevation (NSTEMI) myocardial infarction: Secondary | ICD-10-CM | POA: Diagnosis not present

## 2019-02-17 DIAGNOSIS — I10 Essential (primary) hypertension: Secondary | ICD-10-CM | POA: Diagnosis not present

## 2019-02-24 DIAGNOSIS — E559 Vitamin D deficiency, unspecified: Secondary | ICD-10-CM | POA: Diagnosis not present

## 2019-02-24 DIAGNOSIS — N1832 Chronic kidney disease, stage 3b: Secondary | ICD-10-CM | POA: Diagnosis not present

## 2019-02-24 DIAGNOSIS — I214 Non-ST elevation (NSTEMI) myocardial infarction: Secondary | ICD-10-CM | POA: Diagnosis not present

## 2019-02-24 DIAGNOSIS — I10 Essential (primary) hypertension: Secondary | ICD-10-CM | POA: Diagnosis not present

## 2019-02-24 DIAGNOSIS — Z955 Presence of coronary angioplasty implant and graft: Secondary | ICD-10-CM | POA: Diagnosis not present

## 2019-02-24 DIAGNOSIS — E782 Mixed hyperlipidemia: Secondary | ICD-10-CM | POA: Diagnosis not present

## 2019-02-24 DIAGNOSIS — N183 Chronic kidney disease, stage 3 unspecified: Secondary | ICD-10-CM | POA: Diagnosis not present

## 2019-02-24 DIAGNOSIS — M48061 Spinal stenosis, lumbar region without neurogenic claudication: Secondary | ICD-10-CM | POA: Diagnosis not present

## 2019-02-24 DIAGNOSIS — M810 Age-related osteoporosis without current pathological fracture: Secondary | ICD-10-CM | POA: Diagnosis not present

## 2019-02-27 ENCOUNTER — Telehealth: Payer: Self-pay

## 2019-02-27 ENCOUNTER — Telehealth: Payer: Self-pay | Admitting: Cardiology

## 2019-02-27 NOTE — Telephone Encounter (Signed)
error 

## 2019-03-18 DIAGNOSIS — N183 Chronic kidney disease, stage 3 unspecified: Secondary | ICD-10-CM | POA: Diagnosis not present

## 2019-03-18 DIAGNOSIS — I10 Essential (primary) hypertension: Secondary | ICD-10-CM | POA: Diagnosis not present

## 2019-03-18 DIAGNOSIS — E782 Mixed hyperlipidemia: Secondary | ICD-10-CM | POA: Diagnosis not present

## 2019-03-18 DIAGNOSIS — I214 Non-ST elevation (NSTEMI) myocardial infarction: Secondary | ICD-10-CM | POA: Diagnosis not present

## 2019-03-18 DIAGNOSIS — M810 Age-related osteoporosis without current pathological fracture: Secondary | ICD-10-CM | POA: Diagnosis not present

## 2019-03-23 ENCOUNTER — Other Ambulatory Visit: Payer: Self-pay | Admitting: Cardiology

## 2019-03-24 ENCOUNTER — Other Ambulatory Visit: Payer: Self-pay

## 2019-03-24 MED ORDER — ROSUVASTATIN CALCIUM 10 MG PO TABS: 10.0000 mg | ORAL_TABLET | Freq: Every day | ORAL | 0 refills | Status: DC

## 2019-03-27 DIAGNOSIS — E782 Mixed hyperlipidemia: Secondary | ICD-10-CM | POA: Diagnosis not present

## 2019-03-27 DIAGNOSIS — I10 Essential (primary) hypertension: Secondary | ICD-10-CM | POA: Diagnosis not present

## 2019-03-27 DIAGNOSIS — M810 Age-related osteoporosis without current pathological fracture: Secondary | ICD-10-CM | POA: Diagnosis not present

## 2019-03-27 DIAGNOSIS — I214 Non-ST elevation (NSTEMI) myocardial infarction: Secondary | ICD-10-CM | POA: Diagnosis not present

## 2019-03-27 DIAGNOSIS — N183 Chronic kidney disease, stage 3 unspecified: Secondary | ICD-10-CM | POA: Diagnosis not present

## 2019-04-05 ENCOUNTER — Other Ambulatory Visit: Payer: Self-pay | Admitting: Cardiology

## 2019-04-19 ENCOUNTER — Other Ambulatory Visit: Payer: Self-pay | Admitting: Cardiology

## 2019-05-23 DIAGNOSIS — N183 Chronic kidney disease, stage 3 unspecified: Secondary | ICD-10-CM | POA: Diagnosis not present

## 2019-05-23 DIAGNOSIS — I10 Essential (primary) hypertension: Secondary | ICD-10-CM | POA: Diagnosis not present

## 2019-05-23 DIAGNOSIS — M810 Age-related osteoporosis without current pathological fracture: Secondary | ICD-10-CM | POA: Diagnosis not present

## 2019-05-23 DIAGNOSIS — E782 Mixed hyperlipidemia: Secondary | ICD-10-CM | POA: Diagnosis not present

## 2019-05-23 DIAGNOSIS — I214 Non-ST elevation (NSTEMI) myocardial infarction: Secondary | ICD-10-CM | POA: Diagnosis not present

## 2019-05-27 DIAGNOSIS — E559 Vitamin D deficiency, unspecified: Secondary | ICD-10-CM | POA: Diagnosis not present

## 2019-05-27 DIAGNOSIS — M810 Age-related osteoporosis without current pathological fracture: Secondary | ICD-10-CM | POA: Diagnosis not present

## 2019-05-27 DIAGNOSIS — I10 Essential (primary) hypertension: Secondary | ICD-10-CM | POA: Diagnosis not present

## 2019-05-27 DIAGNOSIS — I214 Non-ST elevation (NSTEMI) myocardial infarction: Secondary | ICD-10-CM | POA: Diagnosis not present

## 2019-05-27 DIAGNOSIS — N1832 Chronic kidney disease, stage 3b: Secondary | ICD-10-CM | POA: Diagnosis not present

## 2019-05-27 DIAGNOSIS — M48061 Spinal stenosis, lumbar region without neurogenic claudication: Secondary | ICD-10-CM | POA: Diagnosis not present

## 2019-05-27 DIAGNOSIS — Z955 Presence of coronary angioplasty implant and graft: Secondary | ICD-10-CM | POA: Diagnosis not present

## 2019-05-27 DIAGNOSIS — E782 Mixed hyperlipidemia: Secondary | ICD-10-CM | POA: Diagnosis not present

## 2019-06-03 DIAGNOSIS — M810 Age-related osteoporosis without current pathological fracture: Secondary | ICD-10-CM | POA: Diagnosis not present

## 2019-06-03 DIAGNOSIS — I10 Essential (primary) hypertension: Secondary | ICD-10-CM | POA: Diagnosis not present

## 2019-06-03 DIAGNOSIS — M48061 Spinal stenosis, lumbar region without neurogenic claudication: Secondary | ICD-10-CM | POA: Diagnosis not present

## 2019-06-03 DIAGNOSIS — E559 Vitamin D deficiency, unspecified: Secondary | ICD-10-CM | POA: Diagnosis not present

## 2019-06-03 DIAGNOSIS — Z955 Presence of coronary angioplasty implant and graft: Secondary | ICD-10-CM | POA: Diagnosis not present

## 2019-06-03 DIAGNOSIS — I214 Non-ST elevation (NSTEMI) myocardial infarction: Secondary | ICD-10-CM | POA: Diagnosis not present

## 2019-06-03 DIAGNOSIS — N1832 Chronic kidney disease, stage 3b: Secondary | ICD-10-CM | POA: Diagnosis not present

## 2019-06-03 DIAGNOSIS — E782 Mixed hyperlipidemia: Secondary | ICD-10-CM | POA: Diagnosis not present

## 2019-06-16 ENCOUNTER — Other Ambulatory Visit: Payer: Self-pay | Admitting: Cardiology

## 2019-06-17 DIAGNOSIS — E559 Vitamin D deficiency, unspecified: Secondary | ICD-10-CM | POA: Diagnosis not present

## 2019-06-17 DIAGNOSIS — Z955 Presence of coronary angioplasty implant and graft: Secondary | ICD-10-CM | POA: Diagnosis not present

## 2019-06-17 DIAGNOSIS — M810 Age-related osteoporosis without current pathological fracture: Secondary | ICD-10-CM | POA: Diagnosis not present

## 2019-06-17 DIAGNOSIS — N1832 Chronic kidney disease, stage 3b: Secondary | ICD-10-CM | POA: Diagnosis not present

## 2019-06-17 DIAGNOSIS — M48061 Spinal stenosis, lumbar region without neurogenic claudication: Secondary | ICD-10-CM | POA: Diagnosis not present

## 2019-06-17 DIAGNOSIS — E782 Mixed hyperlipidemia: Secondary | ICD-10-CM | POA: Diagnosis not present

## 2019-06-17 DIAGNOSIS — I10 Essential (primary) hypertension: Secondary | ICD-10-CM | POA: Diagnosis not present

## 2019-06-17 DIAGNOSIS — I214 Non-ST elevation (NSTEMI) myocardial infarction: Secondary | ICD-10-CM | POA: Diagnosis not present

## 2019-07-02 DIAGNOSIS — S00511A Abrasion of lip, initial encounter: Secondary | ICD-10-CM | POA: Diagnosis not present

## 2019-07-02 DIAGNOSIS — R58 Hemorrhage, not elsewhere classified: Secondary | ICD-10-CM | POA: Diagnosis not present

## 2019-07-17 ENCOUNTER — Other Ambulatory Visit: Payer: Self-pay

## 2019-07-17 MED ORDER — ASPIRIN 81 MG PO TBEC: 81.0000 mg | DELAYED_RELEASE_TABLET | Freq: Every day | ORAL | 2 refills | Status: DC

## 2019-08-13 NOTE — Progress Notes (Signed)
Follow up visit  Subjective:   Tracy Rogers, female    DOB: 02/02/1931, 86 y.o.   MRN: [id number]   Chief Complaint  Patient presents with  . Coronary Artery Disease  . Hyperlipidemia  . Hypertension  . Follow-up    SOB, brusing    HPI  86 y.o. Caucasian female with hypertension, hyperlipidemia, CKD3, arthritis, CAD, NSTEMI 09/2018, here on 6 month follow up for CAD.   She is doing well and denies chest pain, shortness of breath, palpitations, leg edema, orthopnea, PND, TIA/syncope. Blood pressure is elevated today, but home log shows very well controlled blood pressures.   Current Outpatient Medications on File Prior to Visit  Medication Sig Dispense Refill  . amLODipine (NORVASC) 5 MG tablet Take 7.5 mg by mouth daily. 26m in the morning and 2.5 in the evening    . Ascorbic Acid (VITA-C PO) Take by mouth daily.    .Marland Kitchenaspirin 81 MG EC tablet Take 1 tablet (81 mg total) by mouth daily. 90 tablet 2  . Bilberry 1000 MG CAPS Take 1,000 mg by mouth daily.    . Cholecalciferol (VITAMIN D3) 50 MCG (2000 UT) capsule Take 2,000 Units by mouth daily.    .Marland KitchenGARLIC PO Take by mouth daily.    .Marland KitchenLECITHIN PO Take by mouth daily.    .Marland Kitchenlisinopril (PRINIVIL,ZESTRIL) 20 MG tablet Take 1.5 tablets (30 mg total) by mouth daily. (Patient taking differently: Take 0.5 mg by mouth daily. ) 135 tablet 1  . Magnesium 500 MG CAPS Take by mouth daily.    . metoprolol tartrate (LOPRESSOR) 25 MG tablet TAKE 1 TABLET BY MOUTH TWICE A DAY 180 tablet 0  . Multiple Vitamin (MULTIVITAMIN) capsule Take 1 capsule by mouth daily.    . nitroGLYCERIN (NITROSTAT) 0.4 MG SL tablet PLACE 1 TABLET (0.4 MG TOTAL) UNDER THE TONGUE EVERY 5 (FIVE) MINUTES AS NEEDED FOR CHEST PAIN (CP OR SOB). 30 tablet 2  . rosuvastatin (CRESTOR) 10 MG tablet TAKE 1 TABLET BY MOUTH EVERY DAY 90 tablet 0  . ticagrelor (BRILINTA) 90 MG TABS tablet Take 1 tablet (90 mg total) by mouth 2 (two) times daily. 180 tablet 4  . Ubiquinol 100 MG  CAPS Take by mouth.     No current facility-administered medications on file prior to visit.    Cardiovascular studies:  EKG 08/15/2019: Sinus rhythm 69 bpm RBB. LAFB.  Left atrial enlargement.  LVH.  Coronary angiography and intervention 10/24/2018: LM: Normal LAD: Minimal luminal irregularities Diag1 LCx: 100% prox LCx occlusion. Mid 80% stenosis. RCA: Minimal luminal irregularities RPL  Successful percutaneous coronary intervention Prox-mid LCx PTCA and overlapping stents Synergy DES 3.0 X 28 mm &Synergy DES 2.5 X 8 mm   Echocardiogram 10/25/2018: LVEF 60-65%. Mild hypokinesis of the left ventricular, mid-apical inferolateral wall. Grade 1 DD Mild LA dilatation. Moderate MAC. Trace AI RVSP 27 mmHg  Recent labs: 05/2019: Glucose 103, BUN/Cr 28/1.28. EGFR 43. Na/K 141/4.4. Rest of the CMP normal Chol 161, TG 169, HDL 59, LDL 74  Lipid panel 08/20/2018: Chol 229,TG121, HDL 75, LDL 129.   10/25/2018:  H/H 13.7/41. MCV 94. Platelets 306  09/2013: TSH 2.552 normal   Review of Systems  Cardiovascular: Negative for chest pain, dyspnea on exertion, leg swelling, palpitations and syncope.        Vitals:   08/15/19 1316  BP: (!) 146/79  Pulse: 72  SpO2: 96%     Body mass index is 23.43 kg/m. Filed Weights  08/15/19 1316  Weight: 124 lb (56.2 kg)     Objective:   Physical Exam  Constitutional: No distress.  Neck: No JVD present.  Cardiovascular: Normal rate, regular rhythm, normal heart sounds and intact distal pulses.  No murmur heard. Pulmonary/Chest: Effort normal and breath sounds normal. She has no wheezes. She has no rales.  Musculoskeletal:        General: No edema.  Nursing note and vitals reviewed.      Assessment & Recommendations:    86 y.o. Caucasian female with hypertension, hyperlipidemia, CKD3, arthritis, CAD, NSTEMI 09/2018  CAD: NSTEMI 09/2018 fromSubacute LCx occlusion treated with complex PCI (overlapping stents Synergy  DES 3.0 X 28 mm &Synergy DES 2.5 X 8 mm). No recurrent anginal symptoms.  Continue dual antiplatelet therapy with aspirin and Brilinta till 10/24/2019. Continue risk factor modification. Blood pressure usually well controlled, according to patient's blood pressure log.  She wants to come off lisinopril. I asked her to do so in gradual manner, cut the dose in half first and follow up with PCP next month. Continue metoprolol 25 mg twice daily, amlodipine 5 mg daily. Continue crestor 10 mg, with which she has had remarkable improvement in her LDL.  Hyperlipidemia: LDL at goal on Crestor 10.  Hypertension: As above  I encouraged her to get COVID vaccine  Follow-up in 6 months.   Nigel Mormon, MD [facility] [place] Pager: [phone] Office: [phone]

## 2019-08-15 ENCOUNTER — Other Ambulatory Visit: Payer: Self-pay

## 2019-08-15 ENCOUNTER — Encounter: Payer: Self-pay | Admitting: Cardiology

## 2019-08-15 ENCOUNTER — Ambulatory Visit: Payer: Medicare HMO | Admitting: Cardiology

## 2019-08-15 VITALS — BP 146/79 | HR 72 | Ht 61.0 in | Wt 124.0 lb

## 2019-08-15 DIAGNOSIS — I10 Essential (primary) hypertension: Secondary | ICD-10-CM

## 2019-08-15 DIAGNOSIS — E782 Mixed hyperlipidemia: Secondary | ICD-10-CM

## 2019-08-15 DIAGNOSIS — I2510 Atherosclerotic heart disease of native coronary artery without angina pectoris: Secondary | ICD-10-CM

## 2019-08-16 ENCOUNTER — Encounter: Payer: Self-pay | Admitting: Cardiology

## 2019-08-24 ENCOUNTER — Other Ambulatory Visit: Payer: Self-pay | Admitting: Cardiology

## 2019-08-26 DIAGNOSIS — S42309A Unspecified fracture of shaft of humerus, unspecified arm, initial encounter for closed fracture: Secondary | ICD-10-CM

## 2019-08-26 HISTORY — DX: Unspecified fracture of shaft of humerus, unspecified arm, initial encounter for closed fracture: S42.309A

## 2019-08-27 DIAGNOSIS — R69 Illness, unspecified: Secondary | ICD-10-CM | POA: Diagnosis not present

## 2019-09-01 DIAGNOSIS — I10 Essential (primary) hypertension: Secondary | ICD-10-CM | POA: Diagnosis not present

## 2019-09-01 DIAGNOSIS — M810 Age-related osteoporosis without current pathological fracture: Secondary | ICD-10-CM | POA: Diagnosis not present

## 2019-09-01 DIAGNOSIS — N1832 Chronic kidney disease, stage 3b: Secondary | ICD-10-CM | POA: Diagnosis not present

## 2019-09-01 DIAGNOSIS — I214 Non-ST elevation (NSTEMI) myocardial infarction: Secondary | ICD-10-CM | POA: Diagnosis not present

## 2019-09-01 DIAGNOSIS — E782 Mixed hyperlipidemia: Secondary | ICD-10-CM | POA: Diagnosis not present

## 2019-09-01 DIAGNOSIS — N183 Chronic kidney disease, stage 3 unspecified: Secondary | ICD-10-CM | POA: Diagnosis not present

## 2019-09-08 ENCOUNTER — Other Ambulatory Visit: Payer: Self-pay | Admitting: Cardiology

## 2019-09-23 ENCOUNTER — Emergency Department (HOSPITAL_COMMUNITY): Payer: Medicare HMO

## 2019-09-23 ENCOUNTER — Emergency Department (HOSPITAL_COMMUNITY)
Admission: EM | Admit: 2019-09-23 | Discharge: 2019-09-23 | Disposition: A | Discharge status: Home / Self Care | Payer: Medicare HMO | Source: Home / Self Care | Attending: Emergency Medicine | Admitting: Emergency Medicine

## 2019-09-23 ENCOUNTER — Encounter (HOSPITAL_COMMUNITY): Payer: Self-pay | Admitting: Student

## 2019-09-23 DIAGNOSIS — Y9301 Activity, walking, marching and hiking: Secondary | ICD-10-CM | POA: Insufficient documentation

## 2019-09-23 DIAGNOSIS — N182 Chronic kidney disease, stage 2 (mild): Secondary | ICD-10-CM | POA: Diagnosis not present

## 2019-09-23 DIAGNOSIS — S199XXA Unspecified injury of neck, initial encounter: Secondary | ICD-10-CM | POA: Diagnosis not present

## 2019-09-23 DIAGNOSIS — R110 Nausea: Secondary | ICD-10-CM | POA: Diagnosis not present

## 2019-09-23 DIAGNOSIS — S80211A Abrasion, right knee, initial encounter: Secondary | ICD-10-CM | POA: Diagnosis present

## 2019-09-23 DIAGNOSIS — D72829 Elevated white blood cell count, unspecified: Secondary | ICD-10-CM | POA: Diagnosis not present

## 2019-09-23 DIAGNOSIS — S0993XA Unspecified injury of face, initial encounter: Secondary | ICD-10-CM | POA: Diagnosis not present

## 2019-09-23 DIAGNOSIS — T402X5A Adverse effect of other opioids, initial encounter: Secondary | ICD-10-CM | POA: Diagnosis present

## 2019-09-23 DIAGNOSIS — R519 Headache, unspecified: Secondary | ICD-10-CM | POA: Diagnosis not present

## 2019-09-23 DIAGNOSIS — S80212A Abrasion, left knee, initial encounter: Secondary | ICD-10-CM | POA: Diagnosis present

## 2019-09-23 DIAGNOSIS — D72823 Leukemoid reaction: Secondary | ICD-10-CM | POA: Diagnosis present

## 2019-09-23 DIAGNOSIS — Z885 Allergy status to narcotic agent status: Secondary | ICD-10-CM | POA: Diagnosis not present

## 2019-09-23 DIAGNOSIS — M25562 Pain in left knee: Secondary | ICD-10-CM | POA: Insufficient documentation

## 2019-09-23 DIAGNOSIS — Z03818 Encounter for observation for suspected exposure to other biological agents ruled out: Secondary | ICD-10-CM | POA: Diagnosis not present

## 2019-09-23 DIAGNOSIS — M25519 Pain in unspecified shoulder: Secondary | ICD-10-CM | POA: Diagnosis not present

## 2019-09-23 DIAGNOSIS — I2510 Atherosclerotic heart disease of native coronary artery without angina pectoris: Secondary | ICD-10-CM | POA: Diagnosis not present

## 2019-09-23 DIAGNOSIS — S0181XA Laceration without foreign body of other part of head, initial encounter: Secondary | ICD-10-CM | POA: Diagnosis not present

## 2019-09-23 DIAGNOSIS — W19XXXA Unspecified fall, initial encounter: Secondary | ICD-10-CM | POA: Diagnosis not present

## 2019-09-23 DIAGNOSIS — S42352A Displaced comminuted fracture of shaft of humerus, left arm, initial encounter for closed fracture: Secondary | ICD-10-CM | POA: Diagnosis not present

## 2019-09-23 DIAGNOSIS — S8001XA Contusion of right knee, initial encounter: Secondary | ICD-10-CM | POA: Diagnosis present

## 2019-09-23 DIAGNOSIS — R112 Nausea with vomiting, unspecified: Secondary | ICD-10-CM | POA: Diagnosis not present

## 2019-09-23 DIAGNOSIS — Z20822 Contact with and (suspected) exposure to covid-19: Secondary | ICD-10-CM | POA: Diagnosis not present

## 2019-09-23 DIAGNOSIS — R079 Chest pain, unspecified: Secondary | ICD-10-CM | POA: Diagnosis not present

## 2019-09-23 DIAGNOSIS — W109XXA Fall (on) (from) unspecified stairs and steps, initial encounter: Secondary | ICD-10-CM | POA: Diagnosis not present

## 2019-09-23 DIAGNOSIS — Z23 Encounter for immunization: Secondary | ICD-10-CM | POA: Diagnosis not present

## 2019-09-23 DIAGNOSIS — I444 Left anterior fascicular block: Secondary | ICD-10-CM | POA: Diagnosis not present

## 2019-09-23 DIAGNOSIS — Y9289 Other specified places as the place of occurrence of the external cause: Secondary | ICD-10-CM | POA: Insufficient documentation

## 2019-09-23 DIAGNOSIS — S8002XA Contusion of left knee, initial encounter: Secondary | ICD-10-CM | POA: Diagnosis present

## 2019-09-23 DIAGNOSIS — H7292 Unspecified perforation of tympanic membrane, left ear: Secondary | ICD-10-CM

## 2019-09-23 DIAGNOSIS — Z7901 Long term (current) use of anticoagulants: Secondary | ICD-10-CM | POA: Insufficient documentation

## 2019-09-23 DIAGNOSIS — S42332A Displaced oblique fracture of shaft of humerus, left arm, initial encounter for closed fracture: Secondary | ICD-10-CM | POA: Diagnosis not present

## 2019-09-23 DIAGNOSIS — S3993XA Unspecified injury of pelvis, initial encounter: Secondary | ICD-10-CM | POA: Diagnosis not present

## 2019-09-23 DIAGNOSIS — Y998 Other external cause status: Secondary | ICD-10-CM | POA: Insufficient documentation

## 2019-09-23 DIAGNOSIS — M48061 Spinal stenosis, lumbar region without neurogenic claudication: Secondary | ICD-10-CM | POA: Diagnosis present

## 2019-09-23 DIAGNOSIS — S299XXA Unspecified injury of thorax, initial encounter: Secondary | ICD-10-CM | POA: Diagnosis not present

## 2019-09-23 DIAGNOSIS — M810 Age-related osteoporosis without current pathological fracture: Secondary | ICD-10-CM | POA: Diagnosis present

## 2019-09-23 DIAGNOSIS — R52 Pain, unspecified: Secondary | ICD-10-CM | POA: Diagnosis not present

## 2019-09-23 DIAGNOSIS — R296 Repeated falls: Secondary | ICD-10-CM | POA: Diagnosis present

## 2019-09-23 DIAGNOSIS — E785 Hyperlipidemia, unspecified: Secondary | ICD-10-CM | POA: Diagnosis not present

## 2019-09-23 DIAGNOSIS — I129 Hypertensive chronic kidney disease with stage 1 through stage 4 chronic kidney disease, or unspecified chronic kidney disease: Secondary | ICD-10-CM | POA: Diagnosis not present

## 2019-09-23 DIAGNOSIS — N179 Acute kidney failure, unspecified: Secondary | ICD-10-CM | POA: Diagnosis not present

## 2019-09-23 DIAGNOSIS — E559 Vitamin D deficiency, unspecified: Secondary | ICD-10-CM | POA: Diagnosis present

## 2019-09-23 DIAGNOSIS — R1111 Vomiting without nausea: Secondary | ICD-10-CM | POA: Diagnosis not present

## 2019-09-23 DIAGNOSIS — W01198A Fall on same level from slipping, tripping and stumbling with subsequent striking against other object, initial encounter: Secondary | ICD-10-CM | POA: Insufficient documentation

## 2019-09-23 DIAGNOSIS — E860 Dehydration: Secondary | ICD-10-CM | POA: Diagnosis not present

## 2019-09-23 MED ORDER — LIDOCAINE HCL (PF) 1 % IJ SOLN
5.0000 mL | Freq: Once | INTRAMUSCULAR | Status: DC
  Filled 2019-09-23: qty 5

## 2019-09-23 MED ORDER — LIDOCAINE-EPINEPHRINE-TETRACAINE (LET) TOPICAL GEL
3.0000 mL | Freq: Once | TOPICAL | Status: DC
  Filled 2019-09-23: qty 3

## 2019-09-23 MED ORDER — MORPHINE SULFATE (PF) 4 MG/ML IV SOLN
4.0000 mg | Freq: Once | INTRAVENOUS | Status: AC
  Administered 2019-09-23: 4 mg via INTRAVENOUS
  Filled 2019-09-23: qty 1

## 2019-09-23 MED ORDER — ONDANSETRON HCL 4 MG/2ML IJ SOLN
4.0000 mg | Freq: Once | INTRAMUSCULAR | Status: AC
  Administered 2019-09-23: 4 mg via INTRAVENOUS
  Filled 2019-09-23: qty 2

## 2019-09-23 MED ORDER — ONDANSETRON 4 MG PO TBDP
4.0000 mg | ORAL_TABLET | Freq: Three times a day (TID) | ORAL | 0 refills | Status: DC | PRN
Start: 2019-09-23 — End: 2019-09-26

## 2019-09-23 MED ORDER — FENTANYL CITRATE (PF) 100 MCG/2ML IJ SOLN
50.0000 ug | Freq: Once | INTRAMUSCULAR | Status: AC
  Administered 2019-09-23: 50 ug via INTRAVENOUS
  Filled 2019-09-23: qty 2

## 2019-09-23 MED ORDER — OXYCODONE-ACETAMINOPHEN 5-325 MG PO TABS: 1.0000 | ORAL_TABLET | Freq: Once | ORAL | Status: DC

## 2019-09-23 MED ORDER — TETANUS-DIPHTH-ACELL PERTUSSIS 5-2.5-18.5 LF-MCG/0.5 IM SUSP
0.5000 mL | Freq: Once | INTRAMUSCULAR | Status: AC
  Administered 2019-09-23: 0.5 mL via INTRAMUSCULAR
  Filled 2019-09-23: qty 0.5

## 2019-09-23 MED ORDER — OXYCODONE-ACETAMINOPHEN 5-325 MG PO TABS: 1.0000 | ORAL_TABLET | Freq: Four times a day (QID) | ORAL | 0 refills | Status: DC | PRN

## 2019-09-23 MED ORDER — ONDANSETRON HCL 4 MG/2ML IJ SOLN
4.0000 mg | Freq: Once | INTRAMUSCULAR | Status: DC
  Filled 2019-09-23: qty 2

## 2019-09-23 MED ORDER — FENTANYL CITRATE (PF) 100 MCG/2ML IJ SOLN
50.0000 ug | Freq: Once | INTRAMUSCULAR | Status: DC
  Filled 2019-09-23: qty 2

## 2019-09-23 NOTE — ED Provider Notes (Addendum)
[facility]ERGENCY DEPARTMENT Provider Note   CSN: [id number] Arrival date & time: 09/23/19  1234     History Chief Complaint  Patient presents with  . Fall    Tracy Rogers is a 86 y.o. female with a history of hypertension, CAD, hyperlipidemia, and osteoporosis who presents to the ED via EMS as a level II trauma due to fall on brillinta which occurred just PTA.   Patient states she was walking in her slippers when she tripped and fell forward onto her chin, she did not have LOC. She was unable to get up on her own due to L shoulder pain- states this is where she is hurting the most, she also has some discomfort to the left ear, chin, and to the left knee.  Pain is worse with movement.  No alleviating factors.  She sustained abrasions to the chin as well as the left knee.  Unknown last tetanus.  She denies visual disturbance, numbness, tingling, weakness, abdominal pain, chest pain, or dyspnea.  Per EMS no medications administered in route.  HPI     No past medical history on file.  There are no problems to display for this patient.   History reviewed. No pertinent surgical history.     OB History   No obstetric history on file.     No family history on file.  Social History   Tobacco Use  . Smoking status: Not on file  Substance Use Topics  . Alcohol use: Not on file  . Drug use: Not on file    Home Medications Prior to Admission medications   Not on File    Allergies    Patient has no allergy information on record.  Review of Systems   Review of Systems  Constitutional: Negative for chills and fever.  HENT: Positive for ear pain.   Respiratory: Negative for cough and shortness of breath.   Cardiovascular: Negative for chest pain.  Gastrointestinal: Negative for abdominal pain and vomiting.  Musculoskeletal: Positive for arthralgias. Negative for back pain and neck pain.  Skin: Positive for wound.  Neurological: Negative  for dizziness, seizures, syncope, facial asymmetry, speech difficulty, weakness, light-headedness, numbness and headaches.  All other systems reviewed and are negative.   Physical Exam Updated Vital Signs BP (!) 128/107   Pulse 74   Temp (!) 97.1 F (36.2 C) (Temporal)   Resp 18   SpO2 98%   Physical Exam Vitals and nursing note reviewed. Exam conducted with a chaperone present.  Constitutional:      General: She is not in acute distress.    Appearance: She is well-developed.  HENT:     Head: Normocephalic. No raccoon eyes or Battle's sign.      Ears:     Comments: Left EAC with blood present in hemotympanums, concern for ruptured TM.  Right ear exam benign.    Nose: Nose normal.     Mouth/Throat:     Pharynx: Uvula midline.     Comments: Lower lip is swollen with some external bruising.  No intraoral bleeding. No palpable dental instability. Eyes:     General:        Right eye: No discharge.        Left eye: No discharge.     Extraocular Movements: Extraocular movements intact.     Conjunctiva/sclera: Conjunctivae normal.     Pupils: Pupils are equal, round, and reactive to light.  Cardiovascular:     Rate and Rhythm: Normal rate  and regular rhythm.     Heart sounds: No murmur heard.      Comments: 2+ symmetric radial and DP pulses bilaterally. Pulmonary:     Effort: No respiratory distress.     Breath sounds: Normal breath sounds. No wheezing, rhonchi or rales.  Chest:     Comments: Patient does have a bruise to the left inferior medial breast without open wounds. No significant chest wall tenderness to palpation. Abdominal:     General: There is no distension.     Palpations: Abdomen is soft.     Tenderness: There is no abdominal tenderness. There is no guarding or rebound.     Comments: No obvious pelvic tenderness or instability.  Musculoskeletal:     Cervical back: Full passive range of motion without pain. No spinous process tenderness or muscular  tenderness.     Comments: Upper extremities: There is some swelling to the left shoulder, patient has intact active range of motion throughout the upper extremities with the exception of with the left shoulder which is limited secondary to pain.  She is tender over the left glenohumeral joint with some questionable crepitus present.  No overlying open wounds.  Otherwise nontender. Back: No midline tenderness or palpable step-off. Lower extremities: Patient has an abrasion to the left anterior knee.  She has intact active range of motion throughout.  Mildly tender over the left anterior knee.  Skin:    General: Skin is warm and dry.     Findings: No rash.  Neurological:     Comments: Alert.  Clear speech.  CN III to XII grossly intact.  Sensation grossly intact bilateral upper and lower extremities.  5-5 symmetric grip strength.  5-5 strength with plantar dorsiflexion bilaterally.  Patient is able to lift both of her legs off of the bed.  Psychiatric:        Behavior: Behavior normal.     ED Results / Procedures / Treatments   Labs (all labs ordered are listed, but only abnormal results are displayed) Labs Reviewed - No data to display  EKG None  Radiology CT Head Wo Contrast  Result Date: 09/23/2019 CLINICAL DATA:  Acute headache after a fall. The patient takes blood thinners. EXAM: CT HEAD WITHOUT CONTRAST CT MAXILLOFACIAL WITHOUT CONTRAST CT CERVICAL SPINE WITHOUT CONTRAST TECHNIQUE: Multidetector CT imaging of the head, cervical spine, and maxillofacial structures were performed using the standard protocol without intravenous contrast. Multiplanar CT image reconstructions of the cervical spine and maxillofacial structures were also generated. COMPARISON:  CT scan of the head dated 11/07/2010 and cervical radiographs dated 10/22/2007 FINDINGS: CT HEAD FINDINGS Brain: No evidence of acute infarction, hemorrhage, hydrocephalus, extra-axial collection or mass lesion/mass effect. Vascular: No  hyperdense vessel or unexpected calcification. Skull: Normal. Negative for fracture or focal lesion. Other: Normal. CT MAXILLOFACIAL FINDINGS Osseous: No fracture or mandibular dislocation. No destructive process. Orbits: Negative. No traumatic or inflammatory finding. Sinuses: Clear. Soft tissues: Negative. CT CERVICAL SPINE FINDINGS Alignment: 1 mm spondylolisthesis at C4-5, C7-T1, and T1-2 and T2-3. Otherwise normal. Skull base and vertebrae: No acute fracture. No primary bone lesion or focal pathologic process. Soft tissues and spinal canal: No prevertebral fluid or swelling. No visible canal hematoma. Disc levels: C2-3: Tiny central disc bulge with no neural impingement. Moderate left facet arthritis. No foraminal stenosis. C3-4: Tiny disc osteophyte complex asymmetric to the right without neural impingement. Moderate right foraminal narrowing. Moderate right and mild left facet arthritis. C4-5: Small disc osteophyte complex asymmetric to the left.  No foraminal stenosis. Severe left facet arthritis. Disc space narrowing. C5-6: Disc space narrowing. Tiny bilateral uncinate osteophytes. Slight narrowing of the right neural foramen. No facet arthritis. C6-7: Disc space narrowing. No disc bulging or protrusion. No facet arthritis. C7-T1: No disc bulging or protrusion. No foraminal stenosis. No facet arthritis. Upper chest: Negative. Other: None IMPRESSION: 1. Normal CT scan of the head. 2. No acute abnormality of the maxillofacial structures. 3. No acute abnormality of the cervical spine. Multilevel degenerative disc and joint disease. Electronically Signed   By: Lorriane Shire M.D.   On: 09/23/2019 13:53   CT Cervical Spine Wo Contrast  Result Date: 09/23/2019 CLINICAL DATA:  Acute headache after a fall. The patient takes blood thinners. EXAM: CT HEAD WITHOUT CONTRAST CT MAXILLOFACIAL WITHOUT CONTRAST CT CERVICAL SPINE WITHOUT CONTRAST TECHNIQUE: Multidetector CT imaging of the head, cervical spine, and  maxillofacial structures were performed using the standard protocol without intravenous contrast. Multiplanar CT image reconstructions of the cervical spine and maxillofacial structures were also generated. COMPARISON:  CT scan of the head dated 11/07/2010 and cervical radiographs dated 10/22/2007 FINDINGS: CT HEAD FINDINGS Brain: No evidence of acute infarction, hemorrhage, hydrocephalus, extra-axial collection or mass lesion/mass effect. Vascular: No hyperdense vessel or unexpected calcification. Skull: Normal. Negative for fracture or focal lesion. Other: Normal. CT MAXILLOFACIAL FINDINGS Osseous: No fracture or mandibular dislocation. No destructive process. Orbits: Negative. No traumatic or inflammatory finding. Sinuses: Clear. Soft tissues: Negative. CT CERVICAL SPINE FINDINGS Alignment: 1 mm spondylolisthesis at C4-5, C7-T1, and T1-2 and T2-3. Otherwise normal. Skull base and vertebrae: No acute fracture. No primary bone lesion or focal pathologic process. Soft tissues and spinal canal: No prevertebral fluid or swelling. No visible canal hematoma. Disc levels: C2-3: Tiny central disc bulge with no neural impingement. Moderate left facet arthritis. No foraminal stenosis. C3-4: Tiny disc osteophyte complex asymmetric to the right without neural impingement. Moderate right foraminal narrowing. Moderate right and mild left facet arthritis. C4-5: Small disc osteophyte complex asymmetric to the left. No foraminal stenosis. Severe left facet arthritis. Disc space narrowing. C5-6: Disc space narrowing. Tiny bilateral uncinate osteophytes. Slight narrowing of the right neural foramen. No facet arthritis. C6-7: Disc space narrowing. No disc bulging or protrusion. No facet arthritis. C7-T1: No disc bulging or protrusion. No foraminal stenosis. No facet arthritis. Upper chest: Negative. Other: None IMPRESSION: 1. Normal CT scan of the head. 2. No acute abnormality of the maxillofacial structures. 3. No acute abnormality  of the cervical spine. Multilevel degenerative disc and joint disease. Electronically Signed   By: Lorriane Shire M.D.   On: 09/23/2019 13:53   DG Pelvis Portable  Result Date: 09/23/2019 CLINICAL DATA:  Fall. EXAM: PORTABLE PELVIS 1-2 VIEWS COMPARISON:  CT abdomen pelvis dated April 25, 2014. FINDINGS: There is no evidence of pelvic fracture or diastasis. No pelvic bone lesions are seen. Unchanged mild-to-moderate bilateral hip joint space narrowing. IMPRESSION: 1. No acute osseous abnormality. Electronically Signed   By: Titus Dubin M.D.   On: 09/23/2019 13:59   DG Chest Port 1 View  Result Date: 09/23/2019 CLINICAL DATA:  Recent fall with pain, initial encounter EXAM: PORTABLE CHEST 1 VIEW COMPARISON:  10/24/2018 FINDINGS: Cardiac shadow is stable. Aortic calcifications are noted. The lungs are clear bilaterally. No acute bony abnormality is seen. Oblique fracture through the proximal left humerus is noted IMPRESSION: No acute abnormality in the chest. Proximal left humeral fracture is seen. Electronically Signed   By: Inez Catalina M.D.   On: 09/23/2019 13:59  DG Shoulder Left  Result Date: 09/23/2019 CLINICAL DATA:  Fall EXAM: LEFT SHOULDER - 2+ VIEW COMPARISON:  None. FINDINGS: Oblique fracture through the proximal shaft of the left humerus spanning the surgical neck with mild comminution and displacement. Joint spaces are preserved. No dislocation. IMPRESSION: Acute oblique fracture of the proximal shaft of the left humerus with mild comminution and displacement. Electronically Signed   By: Macy Mis M.D.   On: 09/23/2019 13:39   CT Maxillofacial WO CM  Result Date: 09/23/2019 CLINICAL DATA:  Acute headache after a fall. The patient takes blood thinners. EXAM: CT HEAD WITHOUT CONTRAST CT MAXILLOFACIAL WITHOUT CONTRAST CT CERVICAL SPINE WITHOUT CONTRAST TECHNIQUE: Multidetector CT imaging of the head, cervical spine, and maxillofacial structures were performed using the standard  protocol without intravenous contrast. Multiplanar CT image reconstructions of the cervical spine and maxillofacial structures were also generated. COMPARISON:  CT scan of the head dated 11/07/2010 and cervical radiographs dated 10/22/2007 FINDINGS: CT HEAD FINDINGS Brain: No evidence of acute infarction, hemorrhage, hydrocephalus, extra-axial collection or mass lesion/mass effect. Vascular: No hyperdense vessel or unexpected calcification. Skull: Normal. Negative for fracture or focal lesion. Other: Normal. CT MAXILLOFACIAL FINDINGS Osseous: No fracture or mandibular dislocation. No destructive process. Orbits: Negative. No traumatic or inflammatory finding. Sinuses: Clear. Soft tissues: Negative. CT CERVICAL SPINE FINDINGS Alignment: 1 mm spondylolisthesis at C4-5, C7-T1, and T1-2 and T2-3. Otherwise normal. Skull base and vertebrae: No acute fracture. No primary bone lesion or focal pathologic process. Soft tissues and spinal canal: No prevertebral fluid or swelling. No visible canal hematoma. Disc levels: C2-3: Tiny central disc bulge with no neural impingement. Moderate left facet arthritis. No foraminal stenosis. C3-4: Tiny disc osteophyte complex asymmetric to the right without neural impingement. Moderate right foraminal narrowing. Moderate right and mild left facet arthritis. C4-5: Small disc osteophyte complex asymmetric to the left. No foraminal stenosis. Severe left facet arthritis. Disc space narrowing. C5-6: Disc space narrowing. Tiny bilateral uncinate osteophytes. Slight narrowing of the right neural foramen. No facet arthritis. C6-7: Disc space narrowing. No disc bulging or protrusion. No facet arthritis. C7-T1: No disc bulging or protrusion. No foraminal stenosis. No facet arthritis. Upper chest: Negative. Other: None IMPRESSION: 1. Normal CT scan of the head. 2. No acute abnormality of the maxillofacial structures. 3. No acute abnormality of the cervical spine. Multilevel degenerative disc and  joint disease. Electronically Signed   By: Lorriane Shire M.D.   On: 09/23/2019 13:53    Procedures .Marland KitchenLaceration Repair  Date/Time: 09/23/2019 3:41 PM Performed by: Amaryllis Dyke, PA-C Authorized by: Amaryllis Dyke, PA-C   Consent:    Consent obtained:  Verbal   Consent given by:  Patient   Risks discussed:  Infection, need for additional repair, nerve damage, poor wound healing, poor cosmetic result, pain, retained foreign body, tendon damage and vascular damage   Alternatives discussed:  No treatment Anesthesia (see MAR for exact dosages):    Anesthesia method:  Local infiltration   Local anesthetic:  Lidocaine 1% w/o epi Laceration details:    Location:  Face   Face location:  Chin   Length (cm):  1   Depth (mm):  3 Repair type:    Repair type:  Simple Pre-procedure details:    Preparation:  Patient was prepped and draped in usual sterile fashion and imaging obtained to evaluate for foreign bodies Exploration:    Hemostasis achieved with:  Direct pressure   Wound exploration: wound explored through full range of motion and entire  depth of wound probed and visualized   Treatment:    Area cleansed with:  Betadine   Irrigation solution:  Sterile water   Irrigation method:  Pressure wash Skin repair:    Repair method:  Sutures   Suture size:  5-0   Wound skin closure material used: Vicryl rapide.   Suture technique:  Simple interrupted   Number of sutures:  3 Approximation:    Approximation:  Close Post-procedure details:    Dressing:  Non-adherent dressing   Patient tolerance of procedure:  Tolerated well, no immediate complications   (including critical care time)  SPLINT APPLICATION Date/Time: 58:52 Authorized by: Kennith Maes Consent: Verbal consent obtained. Risks and benefits: risks, benefits and alternatives were discussed Consent given by: patient Splint applied by: orthopedic technician Location details: LUE Splint type:  sling Supplies used: sling Post-procedure: The splinted body part was neurovascularly unchanged following the procedure. Patient tolerance: Patient tolerated the procedure well with no immediate complications.   Medications Ordered in ED Medications  fentaNYL (SUBLIMAZE) injection 50 mcg (has no administration in time range)  Tdap (BOOSTRIX) injection 0.5 mL (has no administration in time range)    ED Course  I have reviewed the triage vital signs and the nursing notes.  Pertinent labs & imaging results that were available during my care of the patient were reviewed by me and considered in my medical decision making (see chart for details).    MDM Rules/Calculators/A&P                         Patient presents to the ED as level 2 trauma given mechanical fall on Brilinta.. Nontoxic, vitals without significant abnormality.    Additional history obtained:  Additional history obtained from EMS. Previous records obtained and reviewed- chart merge.   Imaging Studies ordered:  I ordered imaging studies which included CT head, C-spine, and maxillofacial imaging as well as x-rays of the chest, pelvis, and left shoulder. I independently visualized and interpreted imaging which showed Acute oblique fracture of the proximal shaft of the left humerus with mild comminution and displacement, otherwise no significant traumatic injuries.  Left humeral fracture, patient is neurovascularly intact distally, will place in sling immobilizer, PRICE, orthopedics follow up.   L TM rupture w/ hemotympanum- not contaminated, discussed placing of cotton ball w/ Vaseline when showering to avoid any moisture entering the EAC. ENT follow up.   Wounds- Tetanus updated. Chin laceration pressure irrigated and visualized in a bloodless field without evidence of foreign body.  Repaired per procedure note above, patient tolerated well.  Patient lives at home alone, her niece has some concern for her needing some  assistance with her new injury, patient also concerned.  Will consult transition of care team to assist with possible home health needs.  TOC team has evaluated patient, we have placed face-to-face order to assist with home health, patient's niece would like some tips to be able to help assist the patient this evening at home as she is planning to stay with her tonight.  Nursing staff planning to assist at bedside.  Patient care signed out to oncoming team at change of shift pending patient ambulation & patient and family feeling safe for discharge.  This is a shared visit with supervising physician Dr. Ronnald Nian who has independently evaluated patient & provided guidance in evaluation/management/disposition, in agreement with care   Portions of this note were generated with Dragon dictation software. Dictation errors may occur despite best attempts  at proofreading.  Final Clinical Impression(s) / ED Diagnoses Final diagnoses:  Fall, initial encounter  Closed displaced comminuted fracture of shaft of left humerus, initial encounter  Perforation of left tympanic membrane    Rx / DC Orders ED Discharge Orders    None       Amaryllis Dyke, PA-C 09/23/19 1541    Micheline Markes, Glynda Jaeger, PA-C 09/23/19 [place], [place], DO 09/23/19 1614

## 2019-09-23 NOTE — Progress Notes (Signed)
Orthopedic Tech Progress Note Patient Details:  Kynli Chou 26-Apr-1930 [id number]  Level 2 Trauma. Will be needed later after imaging. Patient ID: Marketta Valadez, female   DOB: [birth year], 86 y.o.   MRN: [id number]   Chip Boer 09/23/2019, 2:10 PM

## 2019-09-23 NOTE — Progress Notes (Signed)
Responded to level 2 fall.  Pt. Fell hit her chin and shoulder and another minor scrap.  Per nurse patient is doing find.  Chaplain will follow as needed.  Jaclynn Major, [place], [facility], Pager [phone]

## 2019-09-23 NOTE — Discharge Instructions (Addendum)
Please read and follow all provided instructions.  You have been seen today after a fall during which she sustained a left humeral fracture (bone in the upper arm).  You also have a laceration to her chin as well as a rupture to your left eardrum.  Fracture:  We have placed you in a splint- please keep this clean & dry and intact until you have followed up with orthopedics. Do not put any weight on this extremity Please call orthopedic surgery tomorrow to schedule an appointment within the next 3 days.  Home care instructions: -- *PRICE in the first 24-48 hours after injury: Protect with splint Rest Ice- Do not apply ice pack directly to your splint place towel or similar between your splint and ice/ice pack. Apply ice for 20 min, then remove for 40 min while awake Compression- splint Elevate - N/A based on location.   We have worked to set up home health to assist you around the home.  Medications:  -Percocet-this is a narcotic/controlled substance medication that has potential addicting qualities.  We recommend that you take 1-2 tablets every 6 hours as needed for severe pain.  Do not drive or operate heavy machinery when taking this medicine as it can be sedating. Do not drink alcohol or take other sedating medications when taking this medicine for safety reasons.  Keep this out of reach of small children.  Please be aware this medicine has Tylenol in it (325 mg/tab) do not exceed the maximum dose of Tylenol in a day per over the counter recommendations should you decide to supplement with Tylenol over the counter.   We have prescribed you new medication(s) today. Discuss the medications prescribed today with your pharmacist as they can have adverse effects and interactions with your other medicines including over the counter and prescribed medications. Seek medical evaluation if you start to experience new or abnormal symptoms after taking one of these medicines, seek care immediately if you  start to experience difficulty breathing, feeling of your throat closing, facial swelling, or rash as these could be indications of a more serious allergic reaction  Laceration:  Please keep this area clean and dry for the next 24 hours, after 24 hours you may get the area wet but do not soak it.  The stitches are absorbable and will come out on their own.   Eardrum rupture: Please apply a cotton swab in Vaseline to your left ear canal whenever you are showering as we do not want any water getting into your ear.  We would like you to follow-up with a ear nose and throat doctor within 3 days for reevaluation of this area.   Please follow-up with orthopedics, ear nose and throat, as well as your primary care provider for general recheck.  Return instructions:  Please return if your digits or extremity are numb or tingling, appear gray or blue, or you have severe pain (also elevate the extremity and loosen splint or wrap if you were given one) Please return if you have redness or fevers or drainage from your wound. Please return if you have increasing pain, pus draining from the left ear, redness/swelling behind the ear.  Please return to the Emergency Department if you experience worsening symptoms.  Please return if you have any other emergent concerns. Additional Information:  Your vital signs today were: BP (!) 128/107   Pulse 74   Temp (!) 97.1 F (36.2 C) (Temporal)   Resp 18   SpO2 98%  If  your blood pressure (BP) was elevated above 135/85 this visit, please have this repeated by your doctor within one month. ---------------

## 2019-09-23 NOTE — ED Provider Notes (Signed)
Medical screening examination/treatment/procedure(s) were conducted as a shared visit with non-physician practitioner(s) and myself.  I personally evaluated the patient during the encounter. Briefly, the patient is a 86 y.o. female who presents to the ED as a level 2 trauma after a ground fall while on blood thinners.  Normal vitals.  Patient is alert and oriented.  States that she tripped over her slippers landing on her chin.  Also landed on her left shoulder.  Feels like she has some water in her left ear.  Patient has left-sided hemotympanum.  Probably a small defect in the tympanic membrane.  She has about a 1 to 2 cm laceration on the chin.  Suspect that is why she has the ruptured eardrum likely.  Will get a head CT neck CT face CT.  She is able to open and close her mouth without much pain.  Doubt mandibular fracture but will evaluate.  Patient with abrasions to bilateral knees.  Clear breath sounds.  No pelvic pain.  Has some pain in the left shoulder but appears to be located on exam.  Good pulses.  We will get x-rays of the chest, pelvis, left shoulder.  Tetanus shot to be given.  Patient with proximal left humeral fracture.  Will place in a sling and have her follow-up with orthopedics.  Otherwise head CT, neck CT, face CT is unremarkable.  Will get home health and anticipate discharge to home.  This chart was dictated using voice recognition software.  Despite best efforts to proofread,  errors can occur which can change the documentation meaning.     EKG Interpretation None           Lennice Sites, DO 09/23/19 1432

## 2019-09-23 NOTE — ED Notes (Signed)
[facility] PLEASE CALL FOR UPDATES  SHE IS ON HER WAY OVER HERE AS WELL 12:54

## 2019-09-23 NOTE — ED Triage Notes (Signed)
Pt here as a level 2 trauma after falling walking outside with her slippers on hitting her chin and c/o left shoulder pain , pt is on a blood thinner  , and has a scrap to her left knee

## 2019-09-24 ENCOUNTER — Other Ambulatory Visit: Payer: Self-pay

## 2019-09-24 ENCOUNTER — Inpatient Hospital Stay (HOSPITAL_COMMUNITY)
Admission: EM | Admit: 2019-09-24 | Discharge: 2019-09-26 | DRG: 683 | Disposition: A | Discharge status: Home Health | Payer: Medicare HMO | Attending: Internal Medicine | Admitting: Internal Medicine

## 2019-09-24 ENCOUNTER — Encounter (HOSPITAL_COMMUNITY): Payer: Self-pay | Admitting: *Deleted

## 2019-09-24 ENCOUNTER — Encounter: Payer: Self-pay | Admitting: Cardiology

## 2019-09-24 DIAGNOSIS — S0181XA Laceration without foreign body of other part of head, initial encounter: Secondary | ICD-10-CM | POA: Diagnosis present

## 2019-09-24 DIAGNOSIS — S80211A Abrasion, right knee, initial encounter: Secondary | ICD-10-CM | POA: Diagnosis present

## 2019-09-24 DIAGNOSIS — S42352A Displaced comminuted fracture of shaft of humerus, left arm, initial encounter for closed fracture: Secondary | ICD-10-CM | POA: Diagnosis present

## 2019-09-24 DIAGNOSIS — Z79899 Other long term (current) drug therapy: Secondary | ICD-10-CM

## 2019-09-24 DIAGNOSIS — H7292 Unspecified perforation of tympanic membrane, left ear: Secondary | ICD-10-CM | POA: Diagnosis present

## 2019-09-24 DIAGNOSIS — E860 Dehydration: Secondary | ICD-10-CM | POA: Diagnosis not present

## 2019-09-24 DIAGNOSIS — I129 Hypertensive chronic kidney disease with stage 1 through stage 4 chronic kidney disease, or unspecified chronic kidney disease: Secondary | ICD-10-CM | POA: Diagnosis not present

## 2019-09-24 DIAGNOSIS — Z23 Encounter for immunization: Secondary | ICD-10-CM

## 2019-09-24 DIAGNOSIS — S80212A Abrasion, left knee, initial encounter: Secondary | ICD-10-CM | POA: Diagnosis present

## 2019-09-24 DIAGNOSIS — M48061 Spinal stenosis, lumbar region without neurogenic claudication: Secondary | ICD-10-CM | POA: Diagnosis present

## 2019-09-24 DIAGNOSIS — D72823 Leukemoid reaction: Secondary | ICD-10-CM | POA: Diagnosis present

## 2019-09-24 DIAGNOSIS — E559 Vitamin D deficiency, unspecified: Secondary | ICD-10-CM | POA: Diagnosis present

## 2019-09-24 DIAGNOSIS — W109XXA Fall (on) (from) unspecified stairs and steps, initial encounter: Secondary | ICD-10-CM | POA: Diagnosis not present

## 2019-09-24 DIAGNOSIS — T402X5A Adverse effect of other opioids, initial encounter: Secondary | ICD-10-CM | POA: Diagnosis present

## 2019-09-24 DIAGNOSIS — I444 Left anterior fascicular block: Secondary | ICD-10-CM | POA: Diagnosis not present

## 2019-09-24 DIAGNOSIS — M810 Age-related osteoporosis without current pathological fracture: Secondary | ICD-10-CM | POA: Diagnosis present

## 2019-09-24 DIAGNOSIS — Z7902 Long term (current) use of antithrombotics/antiplatelets: Secondary | ICD-10-CM

## 2019-09-24 DIAGNOSIS — Y9301 Activity, walking, marching and hiking: Secondary | ICD-10-CM | POA: Diagnosis present

## 2019-09-24 DIAGNOSIS — R52 Pain, unspecified: Secondary | ICD-10-CM | POA: Diagnosis not present

## 2019-09-24 DIAGNOSIS — Z885 Allergy status to narcotic agent status: Secondary | ICD-10-CM | POA: Diagnosis not present

## 2019-09-24 DIAGNOSIS — N179 Acute kidney failure, unspecified: Principal | ICD-10-CM | POA: Diagnosis present

## 2019-09-24 DIAGNOSIS — Z03818 Encounter for observation for suspected exposure to other biological agents ruled out: Secondary | ICD-10-CM | POA: Diagnosis not present

## 2019-09-24 DIAGNOSIS — D72829 Elevated white blood cell count, unspecified: Secondary | ICD-10-CM | POA: Diagnosis not present

## 2019-09-24 DIAGNOSIS — I2510 Atherosclerotic heart disease of native coronary artery without angina pectoris: Secondary | ICD-10-CM

## 2019-09-24 DIAGNOSIS — R296 Repeated falls: Secondary | ICD-10-CM | POA: Diagnosis present

## 2019-09-24 DIAGNOSIS — R1111 Vomiting without nausea: Secondary | ICD-10-CM | POA: Diagnosis not present

## 2019-09-24 DIAGNOSIS — Z880 Allergy status to penicillin: Secondary | ICD-10-CM

## 2019-09-24 DIAGNOSIS — N182 Chronic kidney disease, stage 2 (mild): Secondary | ICD-10-CM | POA: Diagnosis not present

## 2019-09-24 DIAGNOSIS — S8001XA Contusion of right knee, initial encounter: Secondary | ICD-10-CM | POA: Diagnosis present

## 2019-09-24 DIAGNOSIS — S8002XA Contusion of left knee, initial encounter: Secondary | ICD-10-CM | POA: Diagnosis present

## 2019-09-24 DIAGNOSIS — E785 Hyperlipidemia, unspecified: Secondary | ICD-10-CM | POA: Diagnosis not present

## 2019-09-24 DIAGNOSIS — W19XXXA Unspecified fall, initial encounter: Secondary | ICD-10-CM | POA: Diagnosis not present

## 2019-09-24 DIAGNOSIS — Z8249 Family history of ischemic heart disease and other diseases of the circulatory system: Secondary | ICD-10-CM

## 2019-09-24 DIAGNOSIS — Z7982 Long term (current) use of aspirin: Secondary | ICD-10-CM

## 2019-09-24 DIAGNOSIS — R112 Nausea with vomiting, unspecified: Secondary | ICD-10-CM | POA: Diagnosis not present

## 2019-09-24 DIAGNOSIS — Z955 Presence of coronary angioplasty implant and graft: Secondary | ICD-10-CM

## 2019-09-24 DIAGNOSIS — Z20822 Contact with and (suspected) exposure to covid-19: Secondary | ICD-10-CM | POA: Diagnosis not present

## 2019-09-24 DIAGNOSIS — R110 Nausea: Secondary | ICD-10-CM | POA: Diagnosis not present

## 2019-09-24 DIAGNOSIS — I10 Essential (primary) hypertension: Secondary | ICD-10-CM | POA: Diagnosis present

## 2019-09-24 HISTORY — DX: Acute kidney failure, unspecified: N17.9

## 2019-09-24 LAB — CBC
HCT: 36.5 % (ref 36.0–46.0)
Hemoglobin: 12.2 g/dL (ref 12.0–15.0)
MCH: 31.9 pg (ref 26.0–34.0)
MCHC: 33.4 g/dL (ref 30.0–36.0)
MCV: 95.3 fL (ref 80.0–100.0)
Platelets: 260 10*3/uL (ref 150–400)
RBC: 3.83 MIL/uL — ABNORMAL LOW (ref 3.87–5.11)
RDW: 13.9 % (ref 11.5–15.5)
WBC: 20 10*3/uL — ABNORMAL HIGH (ref 4.0–10.5)
nRBC: 0 % (ref 0.0–0.2)

## 2019-09-24 LAB — BASIC METABOLIC PANEL
Anion gap: 14 (ref 5–15)
BUN: 55 mg/dL — ABNORMAL HIGH (ref 8–23)
CO2: 24 mmol/L (ref 22–32)
Calcium: 9.5 mg/dL (ref 8.9–10.3)
Chloride: 101 mmol/L (ref 98–111)
Creatinine, Ser: 2.12 mg/dL — ABNORMAL HIGH (ref 0.44–1.00)
GFR calc Af Amer: 24 mL/min — ABNORMAL LOW (ref >60)
GFR calc non Af Amer: 21 mL/min — ABNORMAL LOW (ref >60)
Glucose, Bld: 141 mg/dL — ABNORMAL HIGH (ref 70–99)
Potassium: 4.6 mmol/L (ref 3.5–5.1)
Sodium: 139 mmol/L (ref 135–145)

## 2019-09-24 LAB — SARS CORONAVIRUS 2 BY RT PCR (HOSPITAL ORDER, PERFORMED IN ~~LOC~~ HOSPITAL LAB): SARS Coronavirus 2: NEGATIVE

## 2019-09-24 LAB — CK: Total CK: 99 U/L (ref 38–234)

## 2019-09-24 MED ORDER — OXYCODONE-ACETAMINOPHEN 5-325 MG PO TABS: 1.0000 | ORAL_TABLET | Freq: Four times a day (QID) | ORAL | Status: DC | PRN

## 2019-09-24 MED ORDER — ONDANSETRON HCL 4 MG PO TABS: 4.0000 mg | ORAL_TABLET | Freq: Four times a day (QID) | ORAL | Status: DC | PRN

## 2019-09-24 MED ORDER — ASPIRIN EC 81 MG PO TBEC
81.0000 mg | DELAYED_RELEASE_TABLET | Freq: Every day | ORAL | Status: DC
  Administered 2019-09-25 – 2019-09-26 (×2): 81 mg via ORAL
  Filled 2019-09-24 (×2): qty 1

## 2019-09-24 MED ORDER — SODIUM CHLORIDE 0.9 % IV BOLUS
500.0000 mL | Freq: Once | INTRAVENOUS | Status: AC
  Administered 2019-09-24: 500 mL via INTRAVENOUS

## 2019-09-24 MED ORDER — SODIUM CHLORIDE 0.9 % IV SOLN: INTRAVENOUS | Status: DC

## 2019-09-24 MED ORDER — SODIUM CHLORIDE 0.9 % IV SOLN: 1000.0000 mL | INTRAVENOUS | Status: DC

## 2019-09-24 MED ORDER — TICAGRELOR 90 MG PO TABS
90.0000 mg | ORAL_TABLET | Freq: Two times a day (BID) | ORAL | Status: DC
  Administered 2019-09-24 – 2019-09-26 (×4): 90 mg via ORAL
  Filled 2019-09-24 (×4): qty 1

## 2019-09-24 MED ORDER — NITROGLYCERIN 0.4 MG SL SUBL: 0.4000 mg | SUBLINGUAL_TABLET | SUBLINGUAL | Status: DC | PRN

## 2019-09-24 MED ORDER — MAGNESIUM GLUCONATE 500 MG PO TABS: 500.0000 mg | ORAL_TABLET | Freq: Every day | ORAL | Status: DC

## 2019-09-24 MED ORDER — SODIUM CHLORIDE 0.9 % IV BOLUS (SEPSIS)
500.0000 mL | Freq: Once | INTRAVENOUS | Status: AC
  Administered 2019-09-24: 500 mL via INTRAVENOUS

## 2019-09-24 MED ORDER — AMLODIPINE BESYLATE 5 MG PO TABS
5.0000 mg | ORAL_TABLET | Freq: Every day | ORAL | Status: DC
  Administered 2019-09-25 – 2019-09-26 (×2): 5 mg via ORAL
  Filled 2019-09-24 (×2): qty 1

## 2019-09-24 MED ORDER — ONDANSETRON HCL 4 MG/2ML IJ SOLN
4.0000 mg | Freq: Once | INTRAMUSCULAR | Status: DC
  Filled 2019-09-24: qty 2

## 2019-09-24 MED ORDER — HEPARIN SODIUM (PORCINE) 5000 UNIT/ML IJ SOLN
5000.0000 [IU] | Freq: Three times a day (TID) | INTRAMUSCULAR | Status: DC
  Administered 2019-09-24 – 2019-09-26 (×5): 5000 [IU] via SUBCUTANEOUS
  Filled 2019-09-24 (×5): qty 1

## 2019-09-24 MED ORDER — VITAMIN D 25 MCG (1000 UNIT) PO TABS: 1000.0000 [IU] | ORAL_TABLET | Freq: Every day | ORAL | Status: DC

## 2019-09-24 MED ORDER — METOPROLOL TARTRATE 25 MG PO TABS
25.0000 mg | ORAL_TABLET | Freq: Two times a day (BID) | ORAL | Status: DC
  Administered 2019-09-24 – 2019-09-26 (×4): 25 mg via ORAL
  Filled 2019-09-24 (×4): qty 1

## 2019-09-24 MED ORDER — ADULT MULTIVITAMIN W/MINERALS CH
1.0000 | ORAL_TABLET | Freq: Every day | ORAL | Status: DC
  Administered 2019-09-25 – 2019-09-26 (×2): 1 via ORAL
  Filled 2019-09-24 (×2): qty 1

## 2019-09-24 MED ORDER — ROSUVASTATIN CALCIUM 10 MG PO TABS
10.0000 mg | ORAL_TABLET | Freq: Every day | ORAL | Status: DC
  Administered 2019-09-25 – 2019-09-26 (×2): 10 mg via ORAL
  Filled 2019-09-24 (×2): qty 1

## 2019-09-24 MED ORDER — ONDANSETRON HCL 4 MG/2ML IJ SOLN: 4.0000 mg | Freq: Four times a day (QID) | INTRAMUSCULAR | Status: DC | PRN

## 2019-09-24 MED ORDER — PANTOPRAZOLE SODIUM 40 MG PO TBEC
40.0000 mg | DELAYED_RELEASE_TABLET | Freq: Every day | ORAL | Status: DC
  Administered 2019-09-25 – 2019-09-26 (×2): 40 mg via ORAL
  Filled 2019-09-24 (×2): qty 1

## 2019-09-24 MED ORDER — SODIUM CHLORIDE 0.9% FLUSH
3.0000 mL | Freq: Two times a day (BID) | INTRAVENOUS | Status: DC
  Administered 2019-09-24: 3 mL via INTRAVENOUS

## 2019-09-24 MED ORDER — ASCORBIC ACID 500 MG PO TABS: 500.0000 mg | ORAL_TABLET | Freq: Every day | ORAL | Status: DC

## 2019-09-24 NOTE — ED Notes (Signed)
Report called to [facility]. Patient stable ready to be transported to floor.

## 2019-09-24 NOTE — H&P (Addendum)
History and Physical    Tracy Rogers ALP:[id number] DOB: Oct 19, 1930 DOA: 09/24/2019  PCP: Vernie Shanks, MD   Patient coming from: Home  I have personally briefly reviewed patient's old medical records in [place]  Chief Complaint: Nausea/vomiting  HPI: Tracy Rogers is a 86 y.o. female with medical history significant for coronary artery disease, hypertension, dyslipidemia and spinal stenosis who presents to the emergency room for evaluation of nausea, vomiting and inability to keep any food down for over 24 hours.  Patient was seen at [facility] on 09/23/19 as a level 2 trauma following a mechanical fall.  Patient states that she tripped while walking down the steps and fell landing on her chin.  She denied having any loss of consciousness. She was evaluated in the ER and noted to have an acute oblique fracture of the proximal shaft of the humerus which was placed in a splint.,  She also had a laceration on the chin which was sutured.  Patient lives alone and was discharged home with home health.  She states that since her discharge from the emergency room she has had nausea and vomiting and has not been able to tolerate any oral intake.  She attributes this to the pain medications that she received.  Family is also concerned that she has had frequent falls at home and with this recent incident will have difficulties carrying out activities of daily living. Labs show worsening of her renal function from a baseline of 1.15 to 2.12.  She also has a white count of 20,000 with unclear source.   ED Course: Patient is an 86 year old Caucasian female who presents to the emergency room for evaluation of nausea, vomiting and inability to tolerate any oral intake.  Patient is status post fall with a left humeral fracture.  Labs reveal worsening of her renal function from baseline and she has a leukocytosis of 20,000.  She will be admitted to the hospital for further  evaluation  Review of Systems: As per HPI otherwise 10 point review of systems negative.    Past Medical History:  Diagnosis Date  . AKI (acute kidney injury) ([place]) 09/24/2019  . Arthritis   . Atherosclerosis of native coronary artery of native heart without angina pectoris 02/14/2019  . Hyperlipidemia   . Hypertension   . Lumbar herniated disc   . Osteopenia   . Osteoporosis   . Sciatica   . Spinal stenosis     Past Surgical History:  Procedure Laterality Date  . CORONARY STENT INTERVENTION N/A 10/24/2018   Procedure: CORONARY STENT INTERVENTION;  Surgeon: Nigel Mormon, MD;  Location: [facility];  Service: Cardiovascular;  Laterality: N/A;  . FRACTURE SURGERY Right    wrist, plate inserted  . LEFT HEART CATH AND CORONARY ANGIOGRAPHY N/A 10/24/2018   Procedure: LEFT HEART CATH AND CORONARY ANGIOGRAPHY;  Surgeon: Nigel Mormon, MD;  Location: [facility];  Service: Cardiovascular;  Laterality: N/A;  . lower back surgery  2012   Hernia disk     reports that she has never smoked. She has never used smokeless tobacco. She reports current alcohol use. She reports that she does not use drugs.  Allergies  Allergen Reactions  . Penicillins     PT SPIKED FEVER  . Penicillins Other (See Comments)    Told by family member    Family History  Problem Relation Age of Onset  . Dementia Mother   . Addison's disease Sister   .  Heart disease Father   . Colon cancer Neg Hx   . Colon polyps Neg Hx   . Kidney disease Neg Hx   . Diabetes Neg Hx   . Esophageal cancer Neg Hx   . Gallbladder disease Neg Hx      Prior to Admission medications   Medication Sig Start Date End Date Taking? Authorizing Provider  amLODipine (NORVASC) 5 MG tablet Take 7.5 mg by mouth daily. 5mg  in the morning and 2.5 in the evening    [provider]  amLODipine (NORVASC) 5 MG tablet Take 5 mg by mouth daily. 09/01/19   [provider]  Ascorbic Acid (VITA-C PO)  Take by mouth daily.    [provider]  Ascorbic Acid (VITAMIN C) 1000 MG tablet Take 500 mg by mouth daily.    [provider]  aspirin 81 MG EC tablet Take 1 tablet (81 mg total) by mouth daily. 07/17/19   Adrian Prows, MD  aspirin EC 81 MG tablet Take 81 mg by mouth daily. Swallow whole.    [provider]  Bilberry 1000 MG CAPS Take 1,000 mg by mouth daily.    [provider]  cholecalciferol (VITAMIN D3) 25 MCG (1000 UNIT) tablet Take 1,000 Units by mouth daily.    [provider]  Cholecalciferol (VITAMIN D3) 50 MCG (2000 UT) capsule Take 2,000 Units by mouth daily.    [provider]  co-enzyme Q-10 30 MG capsule Take 30 mg by mouth daily.    [provider]  ELDERBERRY PO Take 1 tablet by mouth daily.    [provider]  Garlic 301 MG CAPS Take 500 mg by mouth daily.    [provider]  GARLIC PO Take by mouth daily.    [provider]  Lecithin 400 MG CAPS Take 400 mg by mouth daily.    [provider]  LECITHIN PO Take by mouth daily.    [provider]  lisinopril (PRINIVIL,ZESTRIL) 20 MG tablet Take 1.5 tablets (30 mg total) by mouth daily. Patient taking differently: Take 0.5 mg by mouth daily.  06/30/14   Schoenhoff, Altamese Cabal, MD  lisinopril (ZESTRIL) 20 MG tablet Take 20 mg by mouth 2 (two) times daily. 05/21/19   [provider]  Magnesium 500 MG CAPS Take by mouth daily.    [provider]  magnesium gluconate (MAGONATE) 500 MG tablet Take 500 mg by mouth daily.    [provider]  metoprolol tartrate (LOPRESSOR) 25 MG tablet TAKE 1 TABLET BY MOUTH TWICE A DAY 03/24/19   Patwardhan, Manish J, MD  metoprolol tartrate (LOPRESSOR) 25 MG tablet Take 25 mg by mouth 2 (two) times daily. 08/28/19   [provider]  Misc Natural Products (LUTEIN 20 PO) Take 20 mg by mouth daily.    [provider]  Multiple Vitamin (MULTIVITAMIN) capsule Take  1 capsule by mouth daily.    [provider]  nitroGLYCERIN (NITROSTAT) 0.4 MG SL tablet PLACE 1 TABLET (0.4 MG TOTAL) UNDER THE TONGUE EVERY 5 (FIVE) MINUTES AS NEEDED FOR CHEST PAIN (CP OR SOB). 04/08/19   Patwardhan, Reynold Bowen, MD  nitroGLYCERIN (NITROSTAT) 0.4 MG SL tablet Place 0.4 mg under the tongue every 5 (five) minutes as needed for chest pain.    [provider]  ondansetron (ZOFRAN ODT) 4 MG disintegrating tablet Take 1 tablet (4 mg total) by mouth every 8 (eight) hours as needed for nausea or vomiting. 09/23/19   Quintella Reichert,  MD  oxyCODONE-acetaminophen (PERCOCET/ROXICET) 5-325 MG tablet Take 1-2 tablets by mouth every 6 (six) hours as needed for severe pain. 09/23/19   Petrucelli, Samantha R, PA-C  pantoprazole (PROTONIX) 40 MG tablet TAKE 1 TABLET BY MOUTH EVERY DAY 09/08/19   Patwardhan, Manish J, MD  rosuvastatin (CRESTOR) 10 MG tablet TAKE 1 TABLET BY MOUTH EVERY DAY 08/26/19   Patwardhan, Manish J, MD  rosuvastatin (CRESTOR) 10 MG tablet Take 10 mg by mouth at bedtime. 08/26/19   [provider]  ticagrelor (BRILINTA) 90 MG TABS tablet Take 1 tablet (90 mg total) by mouth 2 (two) times daily. 02/14/19   Patwardhan, Reynold Bowen, MD  ticagrelor (BRILINTA) 90 MG TABS tablet Take 90 mg by mouth 2 (two) times daily.    [provider]  Ubiquinol 100 MG CAPS Take by mouth.    [provider]    Physical Exam: Vitals:   09/24/19 1339 09/24/19 1340 09/24/19 1343 09/24/19 1600  BP:  (!) 165/96  (!) 156/83  Pulse:  84  88  Resp:   17 16  Temp:  99.7 F (37.6 C)    TempSrc:  Oral    SpO2: 97% 97%  98%  Weight:  54 kg    Height:  5\' 1"  (1.549 m)       Vitals:   09/24/19 1339 09/24/19 1340 09/24/19 1343 09/24/19 1600  BP:  (!) 165/96  (!) 156/83  Pulse:  84  88  Resp:   17 16  Temp:  99.7 F (37.6 C)    TempSrc:  Oral    SpO2: 97% 97%  98%  Weight:  54 kg    Height:  5\' 1"  (1.549 m)      Constitutional: NAD, alert and oriented x 3.   Appears comfortable and in no distress Eyes: PERRL, lids and conjunctivae pallor ENMT: Mucous membranes are moist.  Neck: normal, supple, no masses, no thyromegaly Respiratory: clear to auscultation bilaterally, no wheezing, no crackles. Normal respiratory effort. No accessory muscle use.  Cardiovascular: Regular rate and rhythm,no murmurs / rubs / gallops. No extremity edema. 2+ pedal pulses. No carotid bruits.  Abdomen: no tenderness, no masses palpated. No hepatosplenomegaly. Bowel sounds positive.  Musculoskeletal: no clubbing / cyanosis.  Decreased range of motion left shoulder which is in a sling Skin: no rashes, lesions, bruises to both knees Neurologic: No gross focal neurologic deficit. Psychiatric: Normal mood and affect.   Labs on Admission: I have personally reviewed following labs and imaging studies  CBC: Recent Labs  Lab 09/24/19 1351  WBC 20.0*  HGB 12.2  HCT 36.5  MCV 95.3  PLT 419   Basic Metabolic Panel: Recent Labs  Lab 09/24/19 1351  NA 139  K 4.6  CL 101  CO2 24  GLUCOSE 141*  BUN 55*  CREATININE 2.12*  CALCIUM 9.5   GFR: Estimated Creatinine Clearance: 14.4 mL/min (A) (by C-G formula based on SCr of 2.12 mg/dL (H)). Liver Function Tests: No results for input(s): AST, ALT, ALKPHOS, BILITOT, PROT, ALBUMIN in the last 168 hours. No results for input(s): LIPASE, AMYLASE in the last 168 hours. No results for input(s): AMMONIA in the last 168 hours. Coagulation Profile: No results for input(s): INR, PROTIME in the last 168 hours. Cardiac Enzymes: Recent Labs  Lab 09/24/19 1351  CKTOTAL 99   BNP (last 3 results) No results for input(s): PROBNP in the last 8760 hours. HbA1C: No results for input(s): HGBA1C in the last 72 hours. CBG: No results  for input(s): GLUCAP in the last 168 hours. Lipid Profile: No results for input(s): CHOL, HDL, LDLCALC, TRIG, CHOLHDL, LDLDIRECT in the last 72 hours. Thyroid Function Tests: No results for input(s):  TSH, T4TOTAL, FREET4, T3FREE, THYROIDAB in the last 72 hours. Anemia Panel: No results for input(s): VITAMINB12, FOLATE, FERRITIN, TIBC, IRON, RETICCTPCT in the last 72 hours. Urine analysis:    Component Value Date/Time   COLORURINE YELLOW 04/16/2011 2237   APPEARANCEUR CLEAR 04/16/2011 2237   LABSPEC 1.007 04/16/2011 2237   PHURINE 7.0 04/16/2011 2237   GLUCOSEU NEGATIVE 04/16/2011 2237   HGBUR NEGATIVE 04/16/2011 2237   BILIRUBINUR neg 06/30/2014 [place] 04/16/2011 2237   PROTEINUR neg 06/30/2014 0850   PROTEINUR NEGATIVE 04/16/2011 2237   UROBILINOGEN negative 06/30/2014 0850   UROBILINOGEN 0.2 04/16/2011 2237   NITRITE neg 06/30/2014 0850   NITRITE NEGATIVE 04/16/2011 2237   LEUKOCYTESUR Negative 06/30/2014 0850    Radiological Exams on Admission: CT Head Wo Contrast  Result Date: 09/23/2019 CLINICAL DATA:  Acute headache after a fall. The patient takes blood thinners. EXAM: CT HEAD WITHOUT CONTRAST CT MAXILLOFACIAL WITHOUT CONTRAST CT CERVICAL SPINE WITHOUT CONTRAST TECHNIQUE: Multidetector CT imaging of the head, cervical spine, and maxillofacial structures were performed using the standard protocol without intravenous contrast. Multiplanar CT image reconstructions of the cervical spine and maxillofacial structures were also generated. COMPARISON:  CT scan of the head dated 11/07/2010 and cervical radiographs dated 10/22/2007 FINDINGS: CT HEAD FINDINGS Brain: No evidence of acute infarction, hemorrhage, hydrocephalus, extra-axial collection or mass lesion/mass effect. Vascular: No hyperdense vessel or unexpected calcification. Skull: Normal. Negative for fracture or focal lesion. Other: Normal. CT MAXILLOFACIAL FINDINGS Osseous: No fracture or mandibular dislocation. No destructive process. Orbits: Negative. No traumatic or inflammatory finding. Sinuses: Clear. Soft tissues: Negative. CT CERVICAL SPINE FINDINGS Alignment: 1 mm spondylolisthesis at C4-5, C7-T1, and  T1-2 and T2-3. Otherwise normal. Skull base and vertebrae: No acute fracture. No primary bone lesion or focal pathologic process. Soft tissues and spinal canal: No prevertebral fluid or swelling. No visible canal hematoma. Disc levels: C2-3: Tiny central disc bulge with no neural impingement. Moderate left facet arthritis. No foraminal stenosis. C3-4: Tiny disc osteophyte complex asymmetric to the right without neural impingement. Moderate right foraminal narrowing. Moderate right and mild left facet arthritis. C4-5: Small disc osteophyte complex asymmetric to the left. No foraminal stenosis. Severe left facet arthritis. Disc space narrowing. C5-6: Disc space narrowing. Tiny bilateral uncinate osteophytes. Slight narrowing of the right neural foramen. No facet arthritis. C6-7: Disc space narrowing. No disc bulging or protrusion. No facet arthritis. C7-T1: No disc bulging or protrusion. No foraminal stenosis. No facet arthritis. Upper chest: Negative. Other: None IMPRESSION: 1. Normal CT scan of the head. 2. No acute abnormality of the maxillofacial structures. 3. No acute abnormality of the cervical spine. Multilevel degenerative disc and joint disease. Electronically Signed   By: Lorriane Shire M.D.   On: 09/23/2019 13:53   CT Cervical Spine Wo Contrast  Result Date: 09/23/2019 CLINICAL DATA:  Acute headache after a fall. The patient takes blood thinners. EXAM: CT HEAD WITHOUT CONTRAST CT MAXILLOFACIAL WITHOUT CONTRAST CT CERVICAL SPINE WITHOUT CONTRAST TECHNIQUE: Multidetector CT imaging of the head, cervical spine, and maxillofacial structures were performed using the standard protocol without intravenous contrast. Multiplanar CT image reconstructions of the cervical spine and maxillofacial structures were also generated. COMPARISON:  CT scan of the head dated 11/07/2010 and cervical radiographs dated 10/22/2007 FINDINGS: CT HEAD FINDINGS Brain: No evidence  of acute infarction, hemorrhage, hydrocephalus,  extra-axial collection or mass lesion/mass effect. Vascular: No hyperdense vessel or unexpected calcification. Skull: Normal. Negative for fracture or focal lesion. Other: Normal. CT MAXILLOFACIAL FINDINGS Osseous: No fracture or mandibular dislocation. No destructive process. Orbits: Negative. No traumatic or inflammatory finding. Sinuses: Clear. Soft tissues: Negative. CT CERVICAL SPINE FINDINGS Alignment: 1 mm spondylolisthesis at C4-5, C7-T1, and T1-2 and T2-3. Otherwise normal. Skull base and vertebrae: No acute fracture. No primary bone lesion or focal pathologic process. Soft tissues and spinal canal: No prevertebral fluid or swelling. No visible canal hematoma. Disc levels: C2-3: Tiny central disc bulge with no neural impingement. Moderate left facet arthritis. No foraminal stenosis. C3-4: Tiny disc osteophyte complex asymmetric to the right without neural impingement. Moderate right foraminal narrowing. Moderate right and mild left facet arthritis. C4-5: Small disc osteophyte complex asymmetric to the left. No foraminal stenosis. Severe left facet arthritis. Disc space narrowing. C5-6: Disc space narrowing. Tiny bilateral uncinate osteophytes. Slight narrowing of the right neural foramen. No facet arthritis. C6-7: Disc space narrowing. No disc bulging or protrusion. No facet arthritis. C7-T1: No disc bulging or protrusion. No foraminal stenosis. No facet arthritis. Upper chest: Negative. Other: None IMPRESSION: 1. Normal CT scan of the head. 2. No acute abnormality of the maxillofacial structures. 3. No acute abnormality of the cervical spine. Multilevel degenerative disc and joint disease. Electronically Signed   By: Lorriane Shire M.D.   On: 09/23/2019 13:53   DG Pelvis Portable  Result Date: 09/23/2019 CLINICAL DATA:  Fall. EXAM: PORTABLE PELVIS 1-2 VIEWS COMPARISON:  CT abdomen pelvis dated April 25, 2014. FINDINGS: There is no evidence of pelvic fracture or diastasis. No pelvic bone lesions are  seen. Unchanged mild-to-moderate bilateral hip joint space narrowing. IMPRESSION: 1. No acute osseous abnormality. Electronically Signed   By: Titus Dubin M.D.   On: 09/23/2019 13:59   DG Chest Port 1 View  Result Date: 09/23/2019 CLINICAL DATA:  Recent fall with pain, initial encounter EXAM: PORTABLE CHEST 1 VIEW COMPARISON:  10/24/2018 FINDINGS: Cardiac shadow is stable. Aortic calcifications are noted. The lungs are clear bilaterally. No acute bony abnormality is seen. Oblique fracture through the proximal left humerus is noted IMPRESSION: No acute abnormality in the chest. Proximal left humeral fracture is seen. Electronically Signed   By: Inez Catalina M.D.   On: 09/23/2019 13:59   DG Shoulder Left  Result Date: 09/23/2019 CLINICAL DATA:  Fall EXAM: LEFT SHOULDER - 2+ VIEW COMPARISON:  None. FINDINGS: Oblique fracture through the proximal shaft of the left humerus spanning the surgical neck with mild comminution and displacement. Joint spaces are preserved. No dislocation. IMPRESSION: Acute oblique fracture of the proximal shaft of the left humerus with mild comminution and displacement. Electronically Signed   By: Macy Mis M.D.   On: 09/23/2019 13:39   CT Maxillofacial WO CM  Result Date: 09/23/2019 CLINICAL DATA:  Acute headache after a fall. The patient takes blood thinners. EXAM: CT HEAD WITHOUT CONTRAST CT MAXILLOFACIAL WITHOUT CONTRAST CT CERVICAL SPINE WITHOUT CONTRAST TECHNIQUE: Multidetector CT imaging of the head, cervical spine, and maxillofacial structures were performed using the standard protocol without intravenous contrast. Multiplanar CT image reconstructions of the cervical spine and maxillofacial structures were also generated. COMPARISON:  CT scan of the head dated 11/07/2010 and cervical radiographs dated 10/22/2007 FINDINGS: CT HEAD FINDINGS Brain: No evidence of acute infarction, hemorrhage, hydrocephalus, extra-axial collection or mass lesion/mass effect. Vascular:  No hyperdense vessel or unexpected calcification. Skull: Normal. Negative for  fracture or focal lesion. Other: Normal. CT MAXILLOFACIAL FINDINGS Osseous: No fracture or mandibular dislocation. No destructive process. Orbits: Negative. No traumatic or inflammatory finding. Sinuses: Clear. Soft tissues: Negative. CT CERVICAL SPINE FINDINGS Alignment: 1 mm spondylolisthesis at C4-5, C7-T1, and T1-2 and T2-3. Otherwise normal. Skull base and vertebrae: No acute fracture. No primary bone lesion or focal pathologic process. Soft tissues and spinal canal: No prevertebral fluid or swelling. No visible canal hematoma. Disc levels: C2-3: Tiny central disc bulge with no neural impingement. Moderate left facet arthritis. No foraminal stenosis. C3-4: Tiny disc osteophyte complex asymmetric to the right without neural impingement. Moderate right foraminal narrowing. Moderate right and mild left facet arthritis. C4-5: Small disc osteophyte complex asymmetric to the left. No foraminal stenosis. Severe left facet arthritis. Disc space narrowing. C5-6: Disc space narrowing. Tiny bilateral uncinate osteophytes. Slight narrowing of the right neural foramen. No facet arthritis. C6-7: Disc space narrowing. No disc bulging or protrusion. No facet arthritis. C7-T1: No disc bulging or protrusion. No foraminal stenosis. No facet arthritis. Upper chest: Negative. Other: None IMPRESSION: 1. Normal CT scan of the head. 2. No acute abnormality of the maxillofacial structures. 3. No acute abnormality of the cervical spine. Multilevel degenerative disc and joint disease. Electronically Signed   By: Lorriane Shire M.D.   On: 09/23/2019 13:53    EKG: Independently reviewed. Sinus rhythm Left anterior fascicular block  [facility]  Assessment/Plan Principal Problem:   AKI (acute kidney injury) ([place]) Active Problems:   Essential hypertension   Spinal stenosis of lumbar region   CAD (coronary artery disease)   Closed comminuted left humeral  fracture   Leukemoid reaction     Acute kidney injury Most likely secondary to volume depletion from GI loss Baseline serum creatinine is 1.15 and today on admission it is 2.12 Hold lisinopril We will hydrate patient Obtain CK levels Repeat renal parameters in a.m.   Hypertension Continue amlodipine and metoprolol Lisinopril is on hold due to acute kidney injury   History of coronary artery disease Continue metoprolol, aspirin, Brilinta and statins   Status post fall  Closed comminuted left humeral fracture And laceration of the chin which has been sutured Place patient on fall precautions Orthopedic consult   Leukocytosis Unclear etiology may be a leukemoid reaction No obvious source of infection at this time Follow-up results of UA   DVT prophylaxis: Heparin Code Status: Full Family Communication: Greater than 50% of time was spent discussing plan of care with patient and her niece at the bedside.  She verbalizes understanding and agrees with the plan Disposition Plan: Back to previous home environment Consults called: Orthopedics/ PT    Collier Bullock MD [facility]     09/24/2019, 4:41 PM

## 2019-09-24 NOTE — ED Notes (Signed)
Lab notified to add-on CK.

## 2019-09-24 NOTE — ED Provider Notes (Addendum)
[facility] DEPT Provider Note   CSN: [id number] Arrival date & time: 09/24/19  1312     History Chief Complaint  Patient presents with  . Weakness  . Fall    Tracy Rogers is a 86 y.o. female.  HPI   Patient presented to the ED for evaluation of nausea and vomiting and inability to keep food down after recent fall injury.  Patient was seen in the emergency room yesterday.  Patient presented as a level 2 trauma because she was taking Brilinta.  Patient was walking down her steps when she tripped and fell forward landing on her chin.  She did not have any loss of consciousness.  Patient was having significant pain in her shoulder.  She was also having pain in her left ear and chin.  Patient was evaluated in the ED.  She had CT scans of her head and C-spine.  No acute findings were noted there..  Patient had x-rays of her pelvis chest and shoulder.  Patient was noted to have an acute oblique fracture of the proximal shaft of the humerus.  Patient had her chin laceration repaired.   She was placed in a sling.  Family and the patient was concerned about going home as the patient lives at home.  Transition of care team evaluated the patient.  They recommended home health.  Patient states since going home she has had trouble with inability to eat and nausea.  She was taking pain medications and has had difficulty with nausea and vomiting from pain medications in the past.  The patient was prescribed antinausea medications but did not take any until this morning around 10 AM.  Patient has not had any further episodes of nausea and vomiting since then but she has not had anything to eat.  Patient called her doctor and the home health nurse assessed the patient.  They were concerned that she was discharged and recommended she come back to the ED to make sure she was not dehydrated. Past Medical History:  Diagnosis Date  . Arthritis   . Atherosclerosis of native  coronary artery of native heart without angina pectoris 02/14/2019  . Hyperlipidemia   . Hypertension   . Lumbar herniated disc   . Osteopenia   . Osteoporosis   . Sciatica   . Spinal stenosis     Patient Active Problem List   Diagnosis Date Noted  . Atherosclerosis of native coronary artery of native heart without angina pectoris 02/14/2019  . History of non-ST elevation myocardial infarction (NSTEMI) 02/14/2019  . NSTEMI (non-ST elevated myocardial infarction) ([place]) 10/24/2018  . Abnormal CT of the abdomen  see 03/2014 CT  CBD dilation referred to GI 05/05/2014  . Osteoporosis  Repeatedly refuses Rx meds or injectables.  See [facility] reports 12/03/2013  . Vitamin D deficiency 10/22/2013  . Essential hypertension 10/22/2013  . Hyperlipidemia LDL goal <70 10/22/2013  . Spinal stenosis of lumbar region 10/22/2013  . Sciatica 10/22/2013    Past Surgical History:  Procedure Laterality Date  . CORONARY STENT INTERVENTION N/A 10/24/2018   Procedure: CORONARY STENT INTERVENTION;  Surgeon: Nigel Mormon, MD;  Location: [facility];  Service: Cardiovascular;  Laterality: N/A;  . FRACTURE SURGERY Right    wrist, plate inserted  . LEFT HEART CATH AND CORONARY ANGIOGRAPHY N/A 10/24/2018   Procedure: LEFT HEART CATH AND CORONARY ANGIOGRAPHY;  Surgeon: Nigel Mormon, MD;  Location: [facility];  Service: Cardiovascular;  Laterality: N/A;  .  lower back surgery  2012   Hernia disk     OB History   No obstetric history on file.     Family History  Problem Relation Age of Onset  . Dementia Mother   . Addison's disease Sister   . Heart disease Father   . Colon cancer Neg Hx   . Colon polyps Neg Hx   . Kidney disease Neg Hx   . Diabetes Neg Hx   . Esophageal cancer Neg Hx   . Gallbladder disease Neg Hx     Social History   Tobacco Use  . Smoking status: Never Smoker  . Smokeless tobacco: Never Used  Vaping Use  . Vaping Use: Never used  Substance Use  Topics  . Alcohol use: Yes    Alcohol/week: 0.0 - 1.0 standard drinks    Comment: rare  . Drug use: No    Home Medications Prior to Admission medications   Medication Sig Start Date End Date Taking? Authorizing Provider  amLODipine (NORVASC) 5 MG tablet Take 7.5 mg by mouth daily. 5mg  in the morning and 2.5 in the evening    [provider]  amLODipine (NORVASC) 5 MG tablet Take 5 mg by mouth daily. 09/01/19   [provider]  Ascorbic Acid (VITA-C PO) Take by mouth daily.    [provider]  Ascorbic Acid (VITAMIN C) 1000 MG tablet Take 500 mg by mouth daily.    [provider]  aspirin 81 MG EC tablet Take 1 tablet (81 mg total) by mouth daily. 07/17/19   Adrian Prows, MD  aspirin EC 81 MG tablet Take 81 mg by mouth daily. Swallow whole.    [provider]  Bilberry 1000 MG CAPS Take 1,000 mg by mouth daily.    [provider]  cholecalciferol (VITAMIN D3) 25 MCG (1000 UNIT) tablet Take 1,000 Units by mouth daily.    [provider]  Cholecalciferol (VITAMIN D3) 50 MCG (2000 UT) capsule Take 2,000 Units by mouth daily.    [provider]  co-enzyme Q-10 30 MG capsule Take 30 mg by mouth daily.    [provider]  ELDERBERRY PO Take 1 tablet by mouth daily.    [provider]  Garlic 008 MG CAPS Take 500 mg by mouth daily.    [provider]  GARLIC PO Take by mouth daily.    [provider]  Lecithin 400 MG CAPS Take 400 mg by mouth daily.    [provider]  LECITHIN PO Take by mouth daily.    [provider]  lisinopril (PRINIVIL,ZESTRIL) 20 MG tablet Take 1.5 tablets (30 mg total) by mouth daily. Patient taking differently: Take 0.5 mg by mouth daily.  06/30/14   Schoenhoff, Altamese Cabal, MD  lisinopril (ZESTRIL) 20 MG tablet Take 20 mg by mouth 2 (two) times daily. 05/21/19   [provider]  Magnesium 500 MG CAPS Take by mouth daily.    [provider]  magnesium gluconate (MAGONATE) 500 MG tablet Take 500 mg by mouth daily.    [provider]  metoprolol tartrate (LOPRESSOR) 25 MG tablet TAKE 1 TABLET BY MOUTH TWICE A DAY 03/24/19   Patwardhan, Manish J, MD  metoprolol tartrate (LOPRESSOR) 25 MG tablet Take 25 mg by mouth 2 (two) times daily. 08/28/19   [provider]  Misc Natural Products (LUTEIN 20 PO) Take 20 mg by mouth daily.    [provider]  Multiple Vitamin (MULTIVITAMIN) capsule  Take 1 capsule by mouth daily.    [provider]  nitroGLYCERIN (NITROSTAT) 0.4 MG SL tablet PLACE 1 TABLET (0.4 MG TOTAL) UNDER THE TONGUE EVERY 5 (FIVE) MINUTES AS NEEDED FOR CHEST PAIN (CP OR SOB). 04/08/19   Patwardhan, Reynold Bowen, MD  nitroGLYCERIN (NITROSTAT) 0.4 MG SL tablet Place 0.4 mg under the tongue every 5 (five) minutes as needed for chest pain.    [provider]  ondansetron (ZOFRAN ODT) 4 MG disintegrating tablet Take 1 tablet (4 mg total) by mouth every 8 (eight) hours as needed for nausea or vomiting. 09/23/19   Quintella Reichert, MD  oxyCODONE-acetaminophen (PERCOCET/ROXICET) 5-325 MG tablet Take 1-2 tablets by mouth every 6 (six) hours as needed for severe pain. 09/23/19   Petrucelli, Samantha R, PA-C  pantoprazole (PROTONIX) 40 MG tablet TAKE 1 TABLET BY MOUTH EVERY DAY 09/08/19   Patwardhan, Manish J, MD  rosuvastatin (CRESTOR) 10 MG tablet TAKE 1 TABLET BY MOUTH EVERY DAY 08/26/19   Patwardhan, Manish J, MD  rosuvastatin (CRESTOR) 10 MG tablet Take 10 mg by mouth at bedtime. 08/26/19   [provider]  ticagrelor (BRILINTA) 90 MG TABS tablet Take 1 tablet (90 mg total) by mouth 2 (two) times daily. 02/14/19   Patwardhan, Reynold Bowen, MD  ticagrelor (BRILINTA) 90 MG TABS tablet Take 90 mg by mouth 2 (two) times daily.    [provider]  Ubiquinol 100 MG CAPS Take by mouth.    [provider]    Allergies    Penicillins and Penicillins  Review of Systems   Review of Systems   All other systems reviewed and are negative.   Physical Exam Updated Vital Signs BP (!) 165/96 (BP Location: Right Arm)   Pulse 84   Temp 99.7 F (37.6 C) (Oral)   Resp 17   Ht 1.549 m (5\' 1" )   Wt 54 kg   SpO2 97%   BMI 22.48 kg/m   Physical Exam Vitals and nursing note reviewed.  Constitutional:      General: She is not in acute distress.    Appearance: She is well-developed.  HENT:     Head: Normocephalic and atraumatic.     Comments: Large amount of bruising noted to the chin    Right Ear: External ear normal.     Left Ear: External ear normal.  Eyes:     General: No scleral icterus.       Right eye: No discharge.        Left eye: No discharge.     Conjunctiva/sclera: Conjunctivae normal.  Neck:     Trachea: No tracheal deviation.  Cardiovascular:     Rate and Rhythm: Normal rate.  Pulmonary:     Effort: Pulmonary effort is normal. No respiratory distress.     Breath sounds: No stridor.  Abdominal:     General: There is no distension.  Musculoskeletal:        General: Tenderness and deformity present. No swelling.     Cervical back: Neck supple.     Comments: Tenderness palpation left shoulder, shoulder is in a sling  Skin:    General: Skin is warm and dry.     Findings: No rash.  Neurological:     Mental Status: She is alert.     Cranial Nerves: Cranial nerve deficit: no gross deficits.     ED Results / Procedures / Treatments   Labs (all labs ordered are listed, but only abnormal results are displayed) Labs  Reviewed  CBC - Abnormal; Notable for the following components:      Result Value   WBC 20.0 (*)    RBC 3.83 (*)    All other components within normal limits  BASIC METABOLIC PANEL - Abnormal; Notable for the following components:   Glucose, Bld 141 (*)    BUN 55 (*)    Creatinine, Ser 2.12 (*)    GFR calc non Af Amer 21 (*)    GFR calc Af Amer 24 (*)    All other components within normal limits  SARS CORONAVIRUS 2 BY RT PCR (HOSPITAL  ORDER, [facility])  URINALYSIS, ROUTINE W REFLEX MICROSCOPIC    EKG None  Radiology CT Head Wo Contrast  Result Date: 09/23/2019 CLINICAL DATA:  Acute headache after a fall. The patient takes blood thinners. EXAM: CT HEAD WITHOUT CONTRAST CT MAXILLOFACIAL WITHOUT CONTRAST CT CERVICAL SPINE WITHOUT CONTRAST TECHNIQUE: Multidetector CT imaging of the head, cervical spine, and maxillofacial structures were performed using the standard protocol without intravenous contrast. Multiplanar CT image reconstructions of the cervical spine and maxillofacial structures were also generated. COMPARISON:  CT scan of the head dated 11/07/2010 and cervical radiographs dated 10/22/2007 FINDINGS: CT HEAD FINDINGS Brain: No evidence of acute infarction, hemorrhage, hydrocephalus, extra-axial collection or mass lesion/mass effect. Vascular: No hyperdense vessel or unexpected calcification. Skull: Normal. Negative for fracture or focal lesion. Other: Normal. CT MAXILLOFACIAL FINDINGS Osseous: No fracture or mandibular dislocation. No destructive process. Orbits: Negative. No traumatic or inflammatory finding. Sinuses: Clear. Soft tissues: Negative. CT CERVICAL SPINE FINDINGS Alignment: 1 mm spondylolisthesis at C4-5, C7-T1, and T1-2 and T2-3. Otherwise normal. Skull base and vertebrae: No acute fracture. No primary bone lesion or focal pathologic process. Soft tissues and spinal canal: No prevertebral fluid or swelling. No visible canal hematoma. Disc levels: C2-3: Tiny central disc bulge with no neural impingement. Moderate left facet arthritis. No foraminal stenosis. C3-4: Tiny disc osteophyte complex asymmetric to the right without neural impingement. Moderate right foraminal narrowing. Moderate right and mild left facet arthritis. C4-5: Small disc osteophyte complex asymmetric to the left. No foraminal stenosis. Severe left facet arthritis. Disc space narrowing. C5-6: Disc space narrowing. Tiny  bilateral uncinate osteophytes. Slight narrowing of the right neural foramen. No facet arthritis. C6-7: Disc space narrowing. No disc bulging or protrusion. No facet arthritis. C7-T1: No disc bulging or protrusion. No foraminal stenosis. No facet arthritis. Upper chest: Negative. Other: None IMPRESSION: 1. Normal CT scan of the head. 2. No acute abnormality of the maxillofacial structures. 3. No acute abnormality of the cervical spine. Multilevel degenerative disc and joint disease. Electronically Signed   By: Lorriane Shire M.D.   On: 09/23/2019 13:53   CT Cervical Spine Wo Contrast  Result Date: 09/23/2019 CLINICAL DATA:  Acute headache after a fall. The patient takes blood thinners. EXAM: CT HEAD WITHOUT CONTRAST CT MAXILLOFACIAL WITHOUT CONTRAST CT CERVICAL SPINE WITHOUT CONTRAST TECHNIQUE: Multidetector CT imaging of the head, cervical spine, and maxillofacial structures were performed using the standard protocol without intravenous contrast. Multiplanar CT image reconstructions of the cervical spine and maxillofacial structures were also generated. COMPARISON:  CT scan of the head dated 11/07/2010 and cervical radiographs dated 10/22/2007 FINDINGS: CT HEAD FINDINGS Brain: No evidence of acute infarction, hemorrhage, hydrocephalus, extra-axial collection or mass lesion/mass effect. Vascular: No hyperdense vessel or unexpected calcification. Skull: Normal. Negative for fracture or focal lesion. Other: Normal. CT MAXILLOFACIAL FINDINGS Osseous: No fracture or mandibular dislocation. No destructive process. Orbits:  Negative. No traumatic or inflammatory finding. Sinuses: Clear. Soft tissues: Negative. CT CERVICAL SPINE FINDINGS Alignment: 1 mm spondylolisthesis at C4-5, C7-T1, and T1-2 and T2-3. Otherwise normal. Skull base and vertebrae: No acute fracture. No primary bone lesion or focal pathologic process. Soft tissues and spinal canal: No prevertebral fluid or swelling. No visible canal hematoma. Disc  levels: C2-3: Tiny central disc bulge with no neural impingement. Moderate left facet arthritis. No foraminal stenosis. C3-4: Tiny disc osteophyte complex asymmetric to the right without neural impingement. Moderate right foraminal narrowing. Moderate right and mild left facet arthritis. C4-5: Small disc osteophyte complex asymmetric to the left. No foraminal stenosis. Severe left facet arthritis. Disc space narrowing. C5-6: Disc space narrowing. Tiny bilateral uncinate osteophytes. Slight narrowing of the right neural foramen. No facet arthritis. C6-7: Disc space narrowing. No disc bulging or protrusion. No facet arthritis. C7-T1: No disc bulging or protrusion. No foraminal stenosis. No facet arthritis. Upper chest: Negative. Other: None IMPRESSION: 1. Normal CT scan of the head. 2. No acute abnormality of the maxillofacial structures. 3. No acute abnormality of the cervical spine. Multilevel degenerative disc and joint disease. Electronically Signed   By: Lorriane Shire M.D.   On: 09/23/2019 13:53   DG Pelvis Portable  Result Date: 09/23/2019 CLINICAL DATA:  Fall. EXAM: PORTABLE PELVIS 1-2 VIEWS COMPARISON:  CT abdomen pelvis dated April 25, 2014. FINDINGS: There is no evidence of pelvic fracture or diastasis. No pelvic bone lesions are seen. Unchanged mild-to-moderate bilateral hip joint space narrowing. IMPRESSION: 1. No acute osseous abnormality. Electronically Signed   By: Titus Dubin M.D.   On: 09/23/2019 13:59   DG Chest Port 1 View  Result Date: 09/23/2019 CLINICAL DATA:  Recent fall with pain, initial encounter EXAM: PORTABLE CHEST 1 VIEW COMPARISON:  10/24/2018 FINDINGS: Cardiac shadow is stable. Aortic calcifications are noted. The lungs are clear bilaterally. No acute bony abnormality is seen. Oblique fracture through the proximal left humerus is noted IMPRESSION: No acute abnormality in the chest. Proximal left humeral fracture is seen. Electronically Signed   By: Inez Catalina M.D.   On:  09/23/2019 13:59   DG Shoulder Left  Result Date: 09/23/2019 CLINICAL DATA:  Fall EXAM: LEFT SHOULDER - 2+ VIEW COMPARISON:  None. FINDINGS: Oblique fracture through the proximal shaft of the left humerus spanning the surgical neck with mild comminution and displacement. Joint spaces are preserved. No dislocation. IMPRESSION: Acute oblique fracture of the proximal shaft of the left humerus with mild comminution and displacement. Electronically Signed   By: Macy Mis M.D.   On: 09/23/2019 13:39   CT Maxillofacial WO CM  Result Date: 09/23/2019 CLINICAL DATA:  Acute headache after a fall. The patient takes blood thinners. EXAM: CT HEAD WITHOUT CONTRAST CT MAXILLOFACIAL WITHOUT CONTRAST CT CERVICAL SPINE WITHOUT CONTRAST TECHNIQUE: Multidetector CT imaging of the head, cervical spine, and maxillofacial structures were performed using the standard protocol without intravenous contrast. Multiplanar CT image reconstructions of the cervical spine and maxillofacial structures were also generated. COMPARISON:  CT scan of the head dated 11/07/2010 and cervical radiographs dated 10/22/2007 FINDINGS: CT HEAD FINDINGS Brain: No evidence of acute infarction, hemorrhage, hydrocephalus, extra-axial collection or mass lesion/mass effect. Vascular: No hyperdense vessel or unexpected calcification. Skull: Normal. Negative for fracture or focal lesion. Other: Normal. CT MAXILLOFACIAL FINDINGS Osseous: No fracture or mandibular dislocation. No destructive process. Orbits: Negative. No traumatic or inflammatory finding. Sinuses: Clear. Soft tissues: Negative. CT CERVICAL SPINE FINDINGS Alignment: 1 mm spondylolisthesis at C4-5, C7-T1, and  T1-2 and T2-3. Otherwise normal. Skull base and vertebrae: No acute fracture. No primary bone lesion or focal pathologic process. Soft tissues and spinal canal: No prevertebral fluid or swelling. No visible canal hematoma. Disc levels: C2-3: Tiny central disc bulge with no neural  impingement. Moderate left facet arthritis. No foraminal stenosis. C3-4: Tiny disc osteophyte complex asymmetric to the right without neural impingement. Moderate right foraminal narrowing. Moderate right and mild left facet arthritis. C4-5: Small disc osteophyte complex asymmetric to the left. No foraminal stenosis. Severe left facet arthritis. Disc space narrowing. C5-6: Disc space narrowing. Tiny bilateral uncinate osteophytes. Slight narrowing of the right neural foramen. No facet arthritis. C6-7: Disc space narrowing. No disc bulging or protrusion. No facet arthritis. C7-T1: No disc bulging or protrusion. No foraminal stenosis. No facet arthritis. Upper chest: Negative. Other: None IMPRESSION: 1. Normal CT scan of the head. 2. No acute abnormality of the maxillofacial structures. 3. No acute abnormality of the cervical spine. Multilevel degenerative disc and joint disease. Electronically Signed   By: Lorriane Shire M.D.   On: 09/23/2019 13:53    Procedures Procedures (including critical care time)  Medications Ordered in ED Medications  sodium chloride 0.9 % bolus 500 mL (0 mLs Intravenous Stopped 09/24/19 1501)    Followed by  0.9 %  sodium chloride infusion (has no administration in time range)  ondansetron (ZOFRAN) injection 4 mg (4 mg Intravenous Not Given 09/24/19 1406)    ED Course  I have reviewed the triage vital signs and the nursing notes.  Pertinent labs & imaging results that were available during my care of the patient were reviewed by me and considered in my medical decision making (see chart for details).  Clinical Course as of Sep 23 1529  Wed Sep 24, 2019  1456 White blood cell count elevated compared to previous.   [JK]  1456 BUN and creatinine are elevated compared to previous   [JK]    Clinical Course User Index [JK] Dorie Rank, MD   MDM Rules/Calculators/A&P                          Patient presented to the ED for evaluation of nausea and vomiting after recent  evaluation in the ED for a fall.  Patient had been taking the pain medications and she indicates that that frequently has caused issues with nausea vomiting for her in the past.  Patient started vomiting last night.  She was able to keep anything down.  She did take Zofran this morning around 10 and since that time has not had any recurrent vomiting however she was feeling weak.  She was assessed by the home health care nurse and they talk to her primary doctor.  They felt she was probably dehydrated need to come back to the hospital.  Patient's laboratory tests do show an elevated BUN and creatinine consistent with dehydration.  Patient's not having any abdominal pain.  She not having fevers or chills.  She does have a leukocytosis but I suspect this could be related to her vomiting and stress reaction as she has not been having any infectious symptoms.  I have added on a urine.  We will send off a Covid test.  We will add on a CK as requested.  Patient will be admitted for IV hydration and further treatment. Final Clinical Impression(s) / ED Diagnoses Final diagnoses:  Dehydration  Leukocytosis, unspecified type    Rx / DC Orders ED  Discharge Orders    None       Dorie Rank, MD 09/24/19 1542 Case discussed with Dr Tamera Punt.  Agrees with sling treatment.  No further treatment necessary at this time while in the hospital.  Outpatient follow up with either him or the doctor she was referred to previously.      Dorie Rank, MD 09/24/19 [phone]

## 2019-09-24 NOTE — ED Triage Notes (Signed)
Pt BIBA from home.   Per EMS- Pt reports fall yesterday, dx left broken shoulder, ruptured ear drum, stitches to chin. Pt reports n/v, unable to keep food down.    Pt reports taking zofran prior to EMS arrival, reports sx improvement.

## 2019-09-25 ENCOUNTER — Other Ambulatory Visit: Payer: Self-pay

## 2019-09-25 DIAGNOSIS — N179 Acute kidney failure, unspecified: Principal | ICD-10-CM

## 2019-09-25 LAB — URINALYSIS, ROUTINE W REFLEX MICROSCOPIC
Bacteria, UA: NONE SEEN
Bilirubin Urine: NEGATIVE
Glucose, UA: NEGATIVE mg/dL
Ketones, ur: NEGATIVE mg/dL
Leukocytes,Ua: NEGATIVE
Nitrite: NEGATIVE
Protein, ur: NEGATIVE mg/dL
Specific Gravity, Urine: 1.012 (ref 1.005–1.030)
pH: 6 (ref 5.0–8.0)

## 2019-09-25 LAB — CBC
HCT: 30.4 % — ABNORMAL LOW (ref 36.0–46.0)
Hemoglobin: 10.1 g/dL — ABNORMAL LOW (ref 12.0–15.0)
MCH: 31.7 pg (ref 26.0–34.0)
MCHC: 33.2 g/dL (ref 30.0–36.0)
MCV: 95.3 fL (ref 80.0–100.0)
Platelets: 207 10*3/uL (ref 150–400)
RBC: 3.19 MIL/uL — ABNORMAL LOW (ref 3.87–5.11)
RDW: 13.8 % (ref 11.5–15.5)
WBC: 16 10*3/uL — ABNORMAL HIGH (ref 4.0–10.5)
nRBC: 0 % (ref 0.0–0.2)

## 2019-09-25 LAB — BASIC METABOLIC PANEL
Anion gap: 8 (ref 5–15)
BUN: 41 mg/dL — ABNORMAL HIGH (ref 8–23)
CO2: 21 mmol/L — ABNORMAL LOW (ref 22–32)
Calcium: 8.4 mg/dL — ABNORMAL LOW (ref 8.9–10.3)
Chloride: 106 mmol/L (ref 98–111)
Creatinine, Ser: 1.31 mg/dL — ABNORMAL HIGH (ref 0.44–1.00)
GFR calc Af Amer: 43 mL/min — ABNORMAL LOW (ref >60)
GFR calc non Af Amer: 37 mL/min — ABNORMAL LOW (ref >60)
Glucose, Bld: 123 mg/dL — ABNORMAL HIGH (ref 70–99)
Potassium: 3.6 mmol/L (ref 3.5–5.1)
Sodium: 135 mmol/L (ref 135–145)

## 2019-09-25 MED ORDER — CIPROFLOXACIN-DEXAMETHASONE 0.3-0.1 % OT SUSP
4.0000 [drp] | Freq: Two times a day (BID) | OTIC | Status: DC
  Administered 2019-09-25 – 2019-09-26 (×3): 4 [drp] via OTIC
  Filled 2019-09-25: qty 7.5

## 2019-09-25 NOTE — Plan of Care (Signed)

## 2019-09-25 NOTE — Evaluation (Addendum)
Physical Therapy Evaluation Patient Details Name: Tracy Rogers MRN: [id number] DOB: 1930/05/20 Today's Date: 09/25/2019   History of Present Illness  86 year old Caucasian female who presents to the emergency room for evaluation of nausea, vomiting and inability to tolerate any oral intake.  Patient is status post fall with a left humeral fracture 09/23/19 (seen at [facility] ED, placed in splint, DCed with HH).   Labs reveal worsening of her renal function from baseline and she has a leukocytosis of 20,000.  Clinical Impression  Pt admitted with above diagnosis. Pt ambulated 140' with frequent reaching for handrail in hall for support, no overt loss of balance. Recommended pt use her quad cane at home for increased support when ambulating. She lives alone but is planning to hire private aides to assist.  Pt currently with functional limitations due to the deficits listed below (see PT Problem List). Pt will benefit from skilled PT to increase their independence and safety with mobility to allow discharge to the venue listed below.       Follow Up Recommendations Home health PT; supervision for mobility    Equipment Recommendations  None recommended by PT    Recommendations for Other Services   OT (will place order)    Precautions / Restrictions Precautions Precautions: Fall Precaution Comments: fell 2 days ago, sustained L humerus fx, tx conservatively with sling Required Braces or Orthoses: Sling Restrictions Weight Bearing Restrictions: Yes LUE Weight Bearing: Non weight bearing      Mobility  Bed Mobility Overal bed mobility: Modified Independent             General bed mobility comments: used rail, HOB up  Transfers Overall transfer level: Needs assistance Equipment used: None Transfers: Sit to/from Stand Sit to Stand: Min guard         General transfer comment: min/guard for safety  Ambulation/Gait Ambulation/Gait assistance: Min guard Gait Distance (Feet):  140 Feet Assistive device: None Gait Pattern/deviations: Step-through pattern;Decreased stride length Gait velocity: WFL   General Gait Details: pt frequently reached for handrail in hall, recommended pt use quad cane at home for increased support  Stairs            Wheelchair Mobility    Modified Rankin (Stroke Patients Only)       Balance Overall balance assessment: History of Falls;Needs assistance   Sitting balance-Leahy Scale: Good       Standing balance-Leahy Scale: Good                               Pertinent Vitals/Pain Pain Assessment: No/denies pain    Home Living Family/patient expects to be discharged to:: Private residence Living Arrangements: Alone Available Help at Discharge: Neighbor;Available PRN/intermittently   Home Access: Stairs to enter   Entrance Stairs-Number of Steps: 1 +1 Home Layout: One level Home Equipment: Cane - quad Additional Comments: niece, neighbors can assist some; pt planning to hire private aide    Prior Function Level of Independence: Independent         Comments: drives     Hand Dominance   Dominant Hand: Right    Extremity/Trunk Assessment   Upper Extremity Assessment Upper Extremity Assessment: LUE deficits/detail LUE Deficits / Details: LUE in sling LUE: Unable to fully assess due to immobilization    Lower Extremity Assessment Lower Extremity Assessment: Overall WFL for tasks assessed    Cervical / Trunk Assessment Cervical / Trunk Assessment: Normal  Communication  Communication: No difficulties  Cognition Arousal/Alertness: Awake/alert Behavior During Therapy: WFL for tasks assessed/performed Overall Cognitive Status: Within Functional Limits for tasks assessed                                        General Comments      Exercises     Assessment/Plan    PT Assessment Patient needs continued PT services  PT Problem List Decreased activity  tolerance;Decreased balance;Decreased mobility       PT Treatment Interventions Gait training;Functional mobility training;Therapeutic exercise;Patient/family education;Balance training    PT Goals (Current goals can be found in the Care Plan section)  Acute Rehab PT Goals Patient Stated Goal: return home with help from private aides PT Goal Formulation: With patient Time For Goal Achievement: 10/09/19 Potential to Achieve Goals: Good    Frequency Min 3X/week   Barriers to discharge        Co-evaluation               AM-PAC PT "6 Clicks" Mobility  Outcome Measure Help needed turning from your back to your side while in a flat bed without using bedrails?: None Help needed moving from lying on your back to sitting on the side of a flat bed without using bedrails?: None Help needed moving to and from a bed to a chair (including a wheelchair)?: None Help needed standing up from a chair using your arms (e.g., wheelchair or bedside chair)?: None Help needed to walk in hospital room?: A Little Help needed climbing 3-5 steps with a railing? : A Little 6 Click Score: 22    End of Session Equipment Utilized During Treatment: Gait belt Activity Tolerance: Patient tolerated treatment well Patient left: in chair;with call bell/phone within reach;with chair alarm set Nurse Communication: Mobility status PT Visit Diagnosis: Difficulty in walking, not elsewhere classified (R26.2)    Time: 8403-7543 PT Time Calculation (min) (ACUTE ONLY): 27 min   Charges:   PT Evaluation $PT Eval Low Complexity: 1 Low PT Treatments $Gait Training: 8-22 mins       Blondell Reveal Kistler PT 09/25/2019  Acute Rehabilitation Services Pager [phone] Office [phone]

## 2019-09-25 NOTE — Progress Notes (Addendum)
PROGRESS NOTE    Tracy Rogers  YKZ:[id number] DOB: 1930/10/20 DOA: 09/24/2019 PCP: Vernie Shanks, MD   Brief Narrative:  Tracy Rogers is a 86 y.o. female with medical history significant for coronary artery disease, hypertension, dyslipidemia and spinal stenosis who presents to the emergency room for evaluation of nausea, vomiting and inability to keep any food down for over 24 hours.  Patient was seen at [facility] on 09/23/19 as a level 2 trauma following a mechanical fall.  Patient states that she tripped while walking down the steps and fell landing on her chin.  She denied having any loss of consciousness. She was evaluated in the ER and noted to have an acute oblique fracture of the proximal shaft of the humerus which was placed in a splint.,  She also had a laceration on the chin which was sutured.  Patient lives alone and was discharged home with home health.  She states that since her discharge from the emergency room she has had nausea and vomiting and has not been able to tolerate any oral intake.  She attributes this to the pain medications that she received.  Family is also concerned that she has had frequent falls at home and with this recent incident will have difficulties carrying out activities of daily living. Labs show worsening of her renal function from a baseline of 1.15 to 2.12.  She also has a white count of 20,000 with unclear source. Patient is an 86 year old Caucasian female who presents to the emergency room for evaluation of nausea, vomiting and inability to tolerate any oral intake.  Patient is status post fall with a left humeral fracture.  Labs reveal worsening of her renal function from baseline and she has a leukocytosis of 20,000.  She will be admitted to the hospital for further evaluation.  Assessment & Plan:   Principal Problem:   AKI (acute kidney injury) ([place]) Active Problems:   Essential hypertension   Spinal stenosis of lumbar  region   CAD (coronary artery disease)   Closed comminuted left humeral fracture   Leukemoid reaction  *Afternoon update -spoke with ENT office, recommending Ciprodex drops, will have patient's referral faxed over to their office for 'urgent referral' per their recommendations to be seen after discharge *Spoke with Lucia Gaskins from ortho - humerus is non-op; continue outpatient follow up as previously scheduled.  Acute intractable N/V Likely in the setting of narcotic medications Patient indicates her known side effect from opiates is nausea and vomiting - will discontinue and attempt NSAIDs as needed for left shoulder pain Continue supportive care with Zofran  Acute kidney injury on likely CKD2 Most likely secondary to volume depletion from GI losses Baseline serum creatinine is 1.2 Lab Results  Component Value Date   CREATININE 1.31 (H) 09/25/2019   CREATININE 2.12 (H) 09/24/2019   CREATININE 1.15 (H) 10/26/2018  Hold lisinopril Continue IV fluids - advance diet as tolerated Repeat renal parameters in a.m.  Status post mechanical fall with ongoing ambulatory dysfunction  Per CT 09/23/2019: "Oblique fracture through the proximal shaft of the left humerus spanning the surgical neck with mild comminution and displacement. Joint spaces are preserved." No dislocation Notable laceration of the chin which has been sutured Dry blood noted in left ear canal concern for tympanic membrane rupture at previous evaluation on 6/29 -patient to follow-up with ENT outpatient Place patient on fall precautions Orthopedic consulted - unclear if patient was evaluated at previous hospitalization  Hypertension, essential Continue amlodipine and metoprolol Lisinopril  is on hold due to acute kidney injury  History of coronary artery disease Continue metoprolol, aspirin, Brilinta and statins   Leukemoid reaction, POA, resolving Unclear etiology may be a leukemoid reaction No obvious source of infection  at this time UA negative   DVT prophylaxis: Heparin  Code Status: Full Family Communication: Niece updated over the phone  Status is: Patient  Dispo: The patient is from: Home              Anticipated d/c is to: To be determined              Anticipated d/c date is: 48 to 72 hours              Patient currently not medically stable for discharge due to ongoing need for possible imaging, evaluation and surgical intervention given above.  Patient continues to require IV fluids poor p.o. intake.  Consultants:   Orthopedic surgery  Procedures:   None planned  Antimicrobials:  None indicated  Subjective: Issues or events overnight, nausea vomiting improving drastically while holding narcotic medications, patient continues to complain of left shoulder pain, denies chest pain, shortness of breath, diarrhea, constipation, headache, fevers, chills.  Objective: Vitals:   09/24/19 2007 09/24/19 2133 09/25/19 0115 09/25/19 0438  BP: (!) 160/73 (!) 144/90 (!) 145/73 (!) 147/73  Pulse: 88 92 80 82  Resp: 18 15 15 17   Temp: 99.2 F (37.3 C) 98 F (36.7 C) 98 F (36.7 C) 98.6 F (37 C)  TempSrc: Oral Oral Oral Oral  SpO2: 97% 97% 97% 95%  Weight:      Height:        Intake/Output Summary (Last 24 hours) at 09/25/2019 0703 Last data filed at 09/25/2019 0600 Gross per 24 hour  Intake 1849.46 ml  Output 500 ml  Net 1349.46 ml   Filed Weights   09/24/19 1340  Weight: 54 kg    Examination:  General:  Pleasantly resting in bed, No acute distress. HEENT: Ecchymosis over the chin anterior face and left side of the head with noted dry ear in left ear canal. Neck:  Without mass or deformity.  Trachea is midline. Lungs:  Clear to auscultate bilaterally without rhonchi, wheeze, or rales. Heart:  Regular rate and rhythm.  Without murmurs, rubs, or gallops. Abdomen:  Soft, nontender, nondistended.  Without guarding or rebound. Extremities: Left upper extremity in a sling,  exquisitely tender over lateral shoulder; otherwise without cyanosis, clubbing, edema, or obvious deformity. Vascular:  Dorsalis pedis and posterior tibial pulses palpable bilaterally. Skin:  Warm and dry, no erythema, no ulcerations.  Data Reviewed: I have personally reviewed following labs and imaging studies  CBC: Recent Labs  Lab 09/24/19 1351 09/25/19 0300  WBC 20.0* 16.0*  HGB 12.2 10.1*  HCT 36.5 30.4*  MCV 95.3 95.3  PLT 260 035   Basic Metabolic Panel: Recent Labs  Lab 09/24/19 1351 09/25/19 0300  NA 139 135  K 4.6 3.6  CL 101 106  CO2 24 21*  GLUCOSE 141* 123*  BUN 55* 41*  CREATININE 2.12* 1.31*  CALCIUM 9.5 8.4*   GFR: Estimated Creatinine Clearance: 23.3 mL/min (A) (by C-G formula based on SCr of 1.31 mg/dL (H)). Liver Function Tests: No results for input(s): AST, ALT, ALKPHOS, BILITOT, PROT, ALBUMIN in the last 168 hours. No results for input(s): LIPASE, AMYLASE in the last 168 hours. No results for input(s): AMMONIA in the last 168 hours. Coagulation Profile: No results for input(s): INR, PROTIME in the  last 168 hours. Cardiac Enzymes: Recent Labs  Lab 09/24/19 1351  CKTOTAL 99   BNP (last 3 results) No results for input(s): PROBNP in the last 8760 hours. HbA1C: No results for input(s): HGBA1C in the last 72 hours. CBG: No results for input(s): GLUCAP in the last 168 hours. Lipid Profile: No results for input(s): CHOL, HDL, LDLCALC, TRIG, CHOLHDL, LDLDIRECT in the last 72 hours. Thyroid Function Tests: No results for input(s): TSH, T4TOTAL, FREET4, T3FREE, THYROIDAB in the last 72 hours. Anemia Panel: No results for input(s): VITAMINB12, FOLATE, FERRITIN, TIBC, IRON, RETICCTPCT in the last 72 hours. Sepsis Labs: No results for input(s): PROCALCITON, LATICACIDVEN in the last 168 hours.  Recent Results (from the past 240 hour(s))  SARS Coronavirus 2 by RT PCR (hospital order, performed in [facility] hospital lab) Nasopharyngeal Nasopharyngeal  Swab     Status: None   Collection Time: 09/24/19  4:33 PM   Specimen: Nasopharyngeal Swab  Result Value Ref Range Status   SARS Coronavirus 2 NEGATIVE NEGATIVE Final    Comment: (NOTE) SARS-CoV-2 target nucleic acids are NOT DETECTED.  The SARS-CoV-2 RNA is generally detectable in upper and lower respiratory specimens during the acute phase of infection. The lowest concentration of SARS-CoV-2 viral copies this assay can detect is 250 copies / mL. A negative result does not preclude SARS-CoV-2 infection and should not be used as the sole basis for treatment or other patient management decisions.  A negative result may occur with improper specimen collection / handling, submission of specimen other than nasopharyngeal swab, presence of viral mutation(s) within the areas targeted by this assay, and inadequate number of viral copies (<250 copies / mL). A negative result must be combined with clinical observations, patient history, and epidemiological information.  Fact Sheet for Patients:   StrictlyIdeas.no  Fact Sheet for Healthcare Providers: BankingDealers.co.za  This test is not yet approved or  cleared by the [place] FDA and has been authorized for detection and/or diagnosis of SARS-CoV-2 by FDA under an Emergency Use Authorization (EUA).  This EUA will remain in effect (meaning this test can be used) for the duration of the COVID-19 declaration under Section 564(b)(1) of the Act, 21 U.S.C. section 360bbb-3(b)(1), unless the authorization is terminated or revoked sooner.  Performed at [facility], [place] [address], [place], [place] [address]     Radiology Studies: CT Head Wo Contrast  Result Date: 09/23/2019 CLINICAL DATA:  Acute headache after a fall. The patient takes blood thinners. EXAM: CT HEAD WITHOUT CONTRAST CT MAXILLOFACIAL WITHOUT CONTRAST CT CERVICAL SPINE WITHOUT CONTRAST TECHNIQUE:  Multidetector CT imaging of the head, cervical spine, and maxillofacial structures were performed using the standard protocol without intravenous contrast. Multiplanar CT image reconstructions of the cervical spine and maxillofacial structures were also generated. COMPARISON:  CT scan of the head dated 11/07/2010 and cervical radiographs dated 10/22/2007 FINDINGS: CT HEAD FINDINGS Brain: No evidence of acute infarction, hemorrhage, hydrocephalus, extra-axial collection or mass lesion/mass effect. Vascular: No hyperdense vessel or unexpected calcification. Skull: Normal. Negative for fracture or focal lesion. Other: Normal. CT MAXILLOFACIAL FINDINGS Osseous: No fracture or mandibular dislocation. No destructive process. Orbits: Negative. No traumatic or inflammatory finding. Sinuses: Clear. Soft tissues: Negative. CT CERVICAL SPINE FINDINGS Alignment: 1 mm spondylolisthesis at C4-5, C7-T1, and T1-2 and T2-3. Otherwise normal. Skull base and vertebrae: No acute fracture. No primary bone lesion or focal pathologic process. Soft tissues and spinal canal: No prevertebral fluid or swelling. No visible canal hematoma. Disc levels: C2-3:  Tiny central disc bulge with no neural impingement. Moderate left facet arthritis. No foraminal stenosis. C3-4: Tiny disc osteophyte complex asymmetric to the right without neural impingement. Moderate right foraminal narrowing. Moderate right and mild left facet arthritis. C4-5: Small disc osteophyte complex asymmetric to the left. No foraminal stenosis. Severe left facet arthritis. Disc space narrowing. C5-6: Disc space narrowing. Tiny bilateral uncinate osteophytes. Slight narrowing of the right neural foramen. No facet arthritis. C6-7: Disc space narrowing. No disc bulging or protrusion. No facet arthritis. C7-T1: No disc bulging or protrusion. No foraminal stenosis. No facet arthritis. Upper chest: Negative. Other: None IMPRESSION: 1. Normal CT scan of the head. 2. No acute abnormality  of the maxillofacial structures. 3. No acute abnormality of the cervical spine. Multilevel degenerative disc and joint disease. Electronically Signed   By: Lorriane Shire M.D.   On: 09/23/2019 13:53   CT Cervical Spine Wo Contrast  Result Date: 09/23/2019 CLINICAL DATA:  Acute headache after a fall. The patient takes blood thinners. EXAM: CT HEAD WITHOUT CONTRAST CT MAXILLOFACIAL WITHOUT CONTRAST CT CERVICAL SPINE WITHOUT CONTRAST TECHNIQUE: Multidetector CT imaging of the head, cervical spine, and maxillofacial structures were performed using the standard protocol without intravenous contrast. Multiplanar CT image reconstructions of the cervical spine and maxillofacial structures were also generated. COMPARISON:  CT scan of the head dated 11/07/2010 and cervical radiographs dated 10/22/2007 FINDINGS: CT HEAD FINDINGS Brain: No evidence of acute infarction, hemorrhage, hydrocephalus, extra-axial collection or mass lesion/mass effect. Vascular: No hyperdense vessel or unexpected calcification. Skull: Normal. Negative for fracture or focal lesion. Other: Normal. CT MAXILLOFACIAL FINDINGS Osseous: No fracture or mandibular dislocation. No destructive process. Orbits: Negative. No traumatic or inflammatory finding. Sinuses: Clear. Soft tissues: Negative. CT CERVICAL SPINE FINDINGS Alignment: 1 mm spondylolisthesis at C4-5, C7-T1, and T1-2 and T2-3. Otherwise normal. Skull base and vertebrae: No acute fracture. No primary bone lesion or focal pathologic process. Soft tissues and spinal canal: No prevertebral fluid or swelling. No visible canal hematoma. Disc levels: C2-3: Tiny central disc bulge with no neural impingement. Moderate left facet arthritis. No foraminal stenosis. C3-4: Tiny disc osteophyte complex asymmetric to the right without neural impingement. Moderate right foraminal narrowing. Moderate right and mild left facet arthritis. C4-5: Small disc osteophyte complex asymmetric to the left. No foraminal  stenosis. Severe left facet arthritis. Disc space narrowing. C5-6: Disc space narrowing. Tiny bilateral uncinate osteophytes. Slight narrowing of the right neural foramen. No facet arthritis. C6-7: Disc space narrowing. No disc bulging or protrusion. No facet arthritis. C7-T1: No disc bulging or protrusion. No foraminal stenosis. No facet arthritis. Upper chest: Negative. Other: None IMPRESSION: 1. Normal CT scan of the head. 2. No acute abnormality of the maxillofacial structures. 3. No acute abnormality of the cervical spine. Multilevel degenerative disc and joint disease. Electronically Signed   By: Lorriane Shire M.D.   On: 09/23/2019 13:53   DG Pelvis Portable  Result Date: 09/23/2019 CLINICAL DATA:  Fall. EXAM: PORTABLE PELVIS 1-2 VIEWS COMPARISON:  CT abdomen pelvis dated April 25, 2014. FINDINGS: There is no evidence of pelvic fracture or diastasis. No pelvic bone lesions are seen. Unchanged mild-to-moderate bilateral hip joint space narrowing. IMPRESSION: 1. No acute osseous abnormality. Electronically Signed   By: Titus Dubin M.D.   On: 09/23/2019 13:59   DG Chest Port 1 View  Result Date: 09/23/2019 CLINICAL DATA:  Recent fall with pain, initial encounter EXAM: PORTABLE CHEST 1 VIEW COMPARISON:  10/24/2018 FINDINGS: Cardiac shadow is stable. Aortic calcifications are noted.  The lungs are clear bilaterally. No acute bony abnormality is seen. Oblique fracture through the proximal left humerus is noted IMPRESSION: No acute abnormality in the chest. Proximal left humeral fracture is seen. Electronically Signed   By: Inez Catalina M.D.   On: 09/23/2019 13:59   DG Shoulder Left  Result Date: 09/23/2019 CLINICAL DATA:  Fall EXAM: LEFT SHOULDER - 2+ VIEW COMPARISON:  None. FINDINGS: Oblique fracture through the proximal shaft of the left humerus spanning the surgical neck with mild comminution and displacement. Joint spaces are preserved. No dislocation. IMPRESSION: Acute oblique fracture of the  proximal shaft of the left humerus with mild comminution and displacement. Electronically Signed   By: Macy Mis M.D.   On: 09/23/2019 13:39   CT Maxillofacial WO CM  Result Date: 09/23/2019 CLINICAL DATA:  Acute headache after a fall. The patient takes blood thinners. EXAM: CT HEAD WITHOUT CONTRAST CT MAXILLOFACIAL WITHOUT CONTRAST CT CERVICAL SPINE WITHOUT CONTRAST TECHNIQUE: Multidetector CT imaging of the head, cervical spine, and maxillofacial structures were performed using the standard protocol without intravenous contrast. Multiplanar CT image reconstructions of the cervical spine and maxillofacial structures were also generated. COMPARISON:  CT scan of the head dated 11/07/2010 and cervical radiographs dated 10/22/2007 FINDINGS: CT HEAD FINDINGS Brain: No evidence of acute infarction, hemorrhage, hydrocephalus, extra-axial collection or mass lesion/mass effect. Vascular: No hyperdense vessel or unexpected calcification. Skull: Normal. Negative for fracture or focal lesion. Other: Normal. CT MAXILLOFACIAL FINDINGS Osseous: No fracture or mandibular dislocation. No destructive process. Orbits: Negative. No traumatic or inflammatory finding. Sinuses: Clear. Soft tissues: Negative. CT CERVICAL SPINE FINDINGS Alignment: 1 mm spondylolisthesis at C4-5, C7-T1, and T1-2 and T2-3. Otherwise normal. Skull base and vertebrae: No acute fracture. No primary bone lesion or focal pathologic process. Soft tissues and spinal canal: No prevertebral fluid or swelling. No visible canal hematoma. Disc levels: C2-3: Tiny central disc bulge with no neural impingement. Moderate left facet arthritis. No foraminal stenosis. C3-4: Tiny disc osteophyte complex asymmetric to the right without neural impingement. Moderate right foraminal narrowing. Moderate right and mild left facet arthritis. C4-5: Small disc osteophyte complex asymmetric to the left. No foraminal stenosis. Severe left facet arthritis. Disc space narrowing.  C5-6: Disc space narrowing. Tiny bilateral uncinate osteophytes. Slight narrowing of the right neural foramen. No facet arthritis. C6-7: Disc space narrowing. No disc bulging or protrusion. No facet arthritis. C7-T1: No disc bulging or protrusion. No foraminal stenosis. No facet arthritis. Upper chest: Negative. Other: None IMPRESSION: 1. Normal CT scan of the head. 2. No acute abnormality of the maxillofacial structures. 3. No acute abnormality of the cervical spine. Multilevel degenerative disc and joint disease. Electronically Signed   By: Lorriane Shire M.D.   On: 09/23/2019 13:53   Scheduled Meds: . amLODipine  5 mg Oral Daily  . aspirin EC  81 mg Oral Daily  . heparin  5,000 Units Subcutaneous Q8H  . metoprolol tartrate  25 mg Oral BID  . multivitamin with minerals  1 tablet Oral Daily  . ondansetron  4 mg Intravenous Once  . pantoprazole  40 mg Oral Daily  . rosuvastatin  10 mg Oral Daily  . sodium chloride flush  3 mL Intravenous Q12H  . ticagrelor  90 mg Oral BID   Continuous Infusions: . sodium chloride    . sodium chloride 100 mL/hr at 09/25/19 0200     LOS: 1 day   Time spent: 61min  Arieonna Medine C Korynn Kenedy, DO [facility]  If 7PM-7AM, please  contact night-coverage [email or web address]  09/25/2019, 7:03 AM

## 2019-09-25 NOTE — TOC Initial Note (Signed)
Transition of Care [facility]) - Initial/Assessment Note    Patient Details  Name: Tracy Rogers MRN: [id number] Date of Birth: 11-23-1930  Transition of [facility]) CM/SW Contact:    Lennart Pall, LCSW Phone Number: 09/25/2019, 10:31 AM  Clinical Narrative:  Met with pt to review potential dc needs.  Pt confirms that she lives alone and has local support from her niece.  She has been independent and still driving and managing her household without assist.  She is frustrated with her situation and with recent fall/ injury.  She reports that MD has spoken of potential need for rehab prior to return home  - will follow up on this.  She reports that she does have the ability to hire private caregivers if this is needed at d/c. Very pleasant!   Await therapy evals.  Continue to follow.                 Expected Discharge Plan: [place] Barriers to Discharge: Continued Medical Work up   Patient Goals and CMS Choice Patient states their goals for this hospitalization and ongoing recovery are:: go home      Expected Discharge Plan and Services Expected Discharge Plan: [place] In-house Referral: Clinical Social Work     Living arrangements for the past 2 months: Single Family Home                                      Prior Living Arrangements/Services Living arrangements for the past 2 months: Single Family Home Lives with:: Self Patient language and need for interpreter reviewed:: Yes Do you feel safe going back to the place where you live?: Yes      Need for Family Participation in Patient Care: Yes (Comment) Care giver support system in place?: Yes (comment) (only intermittent from neice)   Criminal Activity/Legal Involvement Pertinent to Current Situation/Hospitalization: No - Comment as needed  Activities of Daily Living Home Assistive Devices/Equipment: Other (Comment), Eyeglasses (left arm sling, raised toilet seats) ADL Screening  (condition at time of admission) Patient's cognitive ability adequate to safely complete daily activities?: Yes Is the patient deaf or have difficulty hearing?: No Does the patient have difficulty seeing, even when wearing glasses/contacts?: No Does the patient have difficulty concentrating, remembering, or making decisions?: No Patient able to express need for assistance with ADLs?: Yes Does the patient have difficulty dressing or bathing?: Yes Independently performs ADLs?: No Communication: Independent Dressing (OT): Needs assistance Is this a change from baseline?: Change from baseline, expected to last >3 days Grooming: Needs assistance Is this a change from baseline?: Change from baseline, expected to last >3 days Feeding: Needs assistance Is this a change from baseline?: Change from baseline, expected to last >3 days Bathing: Needs assistance Is this a change from baseline?: Change from baseline, expected to last >3 days Toileting: Needs assistance Is this a change from baseline?: Change from baseline, expected to last >3days In/Out Bed: Needs assistance Is this a change from baseline?: Change from baseline, expected to last >3 days Walks in Home: Needs assistance Is this a change from baseline?: Change from baseline, expected to last >3 days Does the patient have difficulty walking or climbing stairs?: Yes Weakness of Legs: Right (hx right knee issues) Weakness of Arms/Hands: Right  Permission Sought/Granted Permission sought to share information with : Family Supports Permission granted to share information with :  Yes, Verbal Permission Granted  Share Information with NAME: Marolyn Hammock     Permission granted to share info w Relationship: neice  Permission granted to share info w Contact Information: [phone]  Emotional Assessment Appearance:: Appears stated age Attitude/Demeanor/Rapport: Engaged, Gracious Affect (typically observed): Accepting,  Pleasant Orientation: : Oriented to Self, Oriented to Place, Oriented to  Time, Oriented to Situation Alcohol / Substance Use: Not Applicable Psych Involvement: No (comment)  Admission diagnosis:  Dehydration [E86.0] AKI (acute kidney injury) ([place]) [N17.9] Leukocytosis, unspecified type [D72.829] Patient Active Problem List   Diagnosis Date Noted  . Closed comminuted left humeral fracture 09/24/2019  . AKI (acute kidney injury) ([place]) 09/24/2019  . Leukemoid reaction 09/24/2019  . CAD (coronary artery disease) 02/14/2019  . History of non-ST elevation myocardial infarction (NSTEMI) 02/14/2019  . NSTEMI (non-ST elevated myocardial infarction) ([place]) 10/24/2018  . Abnormal CT of the abdomen  see 03/2014 CT  CBD dilation referred to GI 05/05/2014  . Osteoporosis  Repeatedly refuses Rx meds or injectables.  See [facility] reports 12/03/2013  . Vitamin D deficiency 10/22/2013  . Essential hypertension 10/22/2013  . Hyperlipidemia LDL goal <70 10/22/2013  . Spinal stenosis of lumbar region 10/22/2013  . Sciatica 10/22/2013   PCP:  Vernie Shanks, MD Pharmacy:   [id number] [place] [id number]: [phone]: [phone]    Social Determinants of Health (SDOH) Interventions    Readmission Risk Interventions No flowsheet data found.

## 2019-09-26 MED ORDER — CIPROFLOXACIN-DEXAMETHASONE 0.3-0.1 % OT SUSP: 4.0000 [drp] | Freq: Two times a day (BID) | OTIC | 0 refills | Status: DC

## 2019-09-26 MED ORDER — ONDANSETRON HCL 4 MG PO TABS: 4.0000 mg | ORAL_TABLET | Freq: Four times a day (QID) | ORAL | 0 refills | Status: DC | PRN

## 2019-09-26 NOTE — Plan of Care (Signed)
  Problem: Health Behavior/Discharge Planning: Goal: Ability to manage health-related needs will improve Outcome: Completed/Met  All discharge instructions were given to Pt. All questions were answered.

## 2019-09-26 NOTE — Evaluation (Signed)
Occupational Therapy Evaluation Patient Details Name: Tracy Rogers MRN: [id number] DOB: Dec 08, 1930 Today's Date: 09/26/2019    History of Present Illness 86 year old Caucasian female who presents to the emergency room for evaluation of nausea, vomiting and inability to tolerate any oral intake.  Patient is status post fall with a left humeral fracture 09/23/19 (seen at [facility] ED, placed in splint, DCed with HH).   Labs reveal worsening of her renal function from baseline and she has a leukocytosis of 20,000.   Clinical Impression   Tracy Rogers is an 86 year old woman s/p fall with left proximal humeral fracture who presents with non functional use of LUE. Patient demonstrated ability to perform bed mobility, ambulate in hall without device, perform toileting, dressing and grooming with modified independence. Patient and therapist discussed use of DME in home as compensatory strategies and the use of personal aides during recovery. Recommend HH OT at discharge.     Follow Up Recommendations  Home health OT    Equipment Recommendations  None recommended by OT    Recommendations for Other Services       Precautions / Restrictions Precautions Precautions: Fall Precaution Comments: fell 2 days ago, sustained L humerus fx, tx conservatively with sling Required Braces or Orthoses: Sling Restrictions Weight Bearing Restrictions: Yes LUE Weight Bearing: Non weight bearing      Mobility Bed Mobility Overal bed mobility: Modified Independent             General bed mobility comments: used rail, HOB up  Transfers                 General transfer comment: supervision to ambulate in hall without device.    Balance Overall balance assessment: History of Falls;Needs assistance   Sitting balance-Leahy Scale: Good                                     ADL either performed or assessed with clinical judgement   ADL Overall ADL's : Modified  independent                                       General ADL Comments: Modified independent to perform toileting, grooming, donning socks secondary to immobilized left arm.     Vision Baseline Vision/History: No visual deficits Vision Assessment?: No apparent visual deficits     Perception     Praxis      Pertinent Vitals/Pain Pain Assessment: No/denies pain     Hand Dominance Right   Extremity/Trunk Assessment Upper Extremity Assessment Upper Extremity Assessment: RUE deficits/detail;LUE deficits/detail RUE Deficits / Details: WFL LUE Deficits / Details: LUE in sling, functional forearm, wrist and hand LUE: Unable to fully assess due to immobilization   Lower Extremity Assessment Lower Extremity Assessment: Defer to PT evaluation   Cervical / Trunk Assessment Cervical / Trunk Assessment: Normal   Communication Communication Communication: No difficulties   Cognition   Behavior During Therapy: WFL for tasks assessed/performed Overall Cognitive Status: Within Functional Limits for tasks assessed                                     General Comments       Exercises     Shoulder Instructions  Home Living Family/patient expects to be discharged to:: Private residence Living Arrangements: Alone Available Help at Discharge: Neighbor;Available PRN/intermittently   Home Access: Stairs to enter Entrance Stairs-Number of Steps: 1 +1   Home Layout: One level         Bathroom Toilet: Handicapped height     Home Equipment: Cane - quad   Additional Comments: niece, neighbors can assist some; pt planning to hire private aide      Prior Functioning/Environment Level of Independence: Independent        Comments: drives        OT Problem List: Impaired UE functional use      OT Treatment/Interventions:      OT Goals(Current goals can be found in the care plan section) Acute Rehab OT Goals Patient Stated Goal: to  go home OT Goal Formulation: All assessment and education complete, DC therapy  OT Frequency:     Barriers to D/C:            Co-evaluation              AM-PAC OT "6 Clicks" Daily Activity     Outcome Measure Help from another person eating meals?: A Little Help from another person taking care of personal grooming?: None Help from another person toileting, which includes using toliet, bedpan, or urinal?: None Help from another person bathing (including washing, rinsing, drying)?: None Help from another person to put on and taking off regular upper body clothing?: None Help from another person to put on and taking off regular lower body clothing?: None 6 Click Score: 23   End of Session Equipment Utilized During Treatment: Gait belt Nurse Communication: Mobility status  Activity Tolerance: Patient tolerated treatment well Patient left: in chair;with call bell/phone within reach;with nursing/sitter in room  OT Visit Diagnosis: Repeated falls (R29.6)                Time: 9179-1505 OT Time Calculation (min): 31 min Charges:  OT General Charges $OT Visit: 1 Visit OT Evaluation $OT Eval Low Complexity: 1 Low OT Treatments $Self Care/Home Management : 8-22 mins  Maty Zeisler, OTR/L [place]  Office [phone] Pager: [phone]   Lenward Chancellor 09/26/2019, 1:20 PM

## 2019-09-26 NOTE — Discharge Summary (Signed)
Physician Discharge Summary  Tracy Rogers KYH:[id number] DOB: Aug 09, 1930 DOA: 09/24/2019  PCP: Tracy Shanks, MD  Admit date: 09/24/2019 Discharge date: 09/26/2019  Admitted From: Home Disposition:  Home w/ [place]  Recommendations for Outpatient Follow-up:  1. Follow up with PCP, orthopedic surgery, ENT in 1-2 weeks as scheduled  Home Health: Yes Equipment/Devices: None  Discharge Condition: Stable CODE STATUS: Full Diet recommendation: As tolerated  Brief/Interim Summary: Tracy Rogers a 86 y.o.femalewith medical history significant forcoronary artery disease, hypertension, dyslipidemia and spinal stenosis who presents to the emergency room for evaluation of nausea, vomiting and inability to keep any food down for over 24 hours. Patient was seen at [facility] on 06/29/21as a level 2 trauma following a mechanical fall. Patient states that she tripped while walking down the steps and fell landing on her chin. She denied having any loss of consciousness. She was evaluated in the ER and noted to have an acute oblique fracture of the proximal shaft of the humerus which was placed in a splint.,She also had a laceration on the chin which was sutured. Patient lives alone and was discharged home with home health.She states that since her discharge from the emergency room she has had nausea and vomiting and has not been able to tolerate any oral intake.She attributes this to the pain medications that she received.Family is also concerned that she has had frequent falls at home and with this recent incident will have difficulties carrying out activities of daily living. Labs show worsening of her renal function from a baseline of1.15 to 2.12.She also has a white count of 20,000 with unclear source.Patient is an 86 year old Caucasian female who presents to the emergency room for evaluation of nausea, vomiting and inability to tolerate any oral intake.Patient is  status post fall with a left humeral fracture.Labs reveal worsening of her renal function from baseline and she has a leukocytosis of 20,000.She will be admitted to the hospital for further evaluation.  Patient met as above with acute intractable nausea vomiting in the setting of narcotics after recent mechanical fall with notable left humeral fracture, chin laceration.  Patient was rehydrated with IV fluids, narcotics were held and patient improved drastically over the past 48 hours.  PT evaluated patient for ambulatory dysfunction given recent mechanical fall and noted the patient is independent for ambulation did not require any further ongoing physical therapy.  Orthopedics was consulted to confirm nonoperative status of left humeral fracture which they did agree with with close follow-up outpatient management in the next 1 to 2 weeks.  Patient also noted to have likely tympanic membrane rupture at previous hospitalization requiring further evaluation with ENT who have been contacted and recommend outpatient follow-up as well, family is aware.  At this time given patient's drastic improvement in nausea vomiting, downtrending creatinine with p.o. intake and improving ambulation she is otherwise stable and agreeable for discharge home.  Close follow-up as above with PCP, orthopedics and ENT as scheduled.  Discharge Diagnoses:  Principal Problem:   AKI (acute kidney injury) ([place]) Active Problems:   Essential hypertension   Spinal stenosis of lumbar region   CAD (coronary artery disease)   Closed comminuted left humeral fracture   Leukemoid reaction    Discharge Instructions  Discharge Instructions    Call MD for:  difficulty breathing, headache or visual disturbances   Complete by: As directed    Call MD for:  extreme fatigue   Complete by: As directed    Call MD for:  hives   Complete by: As directed    Call MD for:  persistant dizziness or light-headedness   Complete by: As directed     Call MD for:  persistant nausea and vomiting   Complete by: As directed    Call MD for:  redness, tenderness, or signs of infection (pain, swelling, redness, odor or green/yellow discharge around incision site)   Complete by: As directed    Call MD for:  severe uncontrolled pain   Complete by: As directed    Call MD for:  temperature >100.4   Complete by: As directed    Diet - low sodium heart healthy   Complete by: As directed    Increase activity slowly   Complete by: As directed      Allergies as of 09/26/2019      Reactions   Penicillins    PT SPIKED FEVER   Hydrocodone Nausea And Vomiting   Other    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   Oxycodone Nausea And Vomiting      Medication List    STOP taking these medications   lisinopril 20 MG tablet Commonly known as: ZESTRIL   ondansetron 4 MG disintegrating tablet Commonly known as: Zofran ODT     TAKE these medications   amLODipine 5 MG tablet Commonly known as: NORVASC Take 2.5-5 mg by mouth See admin instructions. Takes 18m by mouth in the morning and 2.5 mg in the evening   aspirin 81 MG EC tablet Take 1 tablet (81 mg total) by mouth daily.   Bilberry 1000 MG Caps Take 1,000 mg by mouth in the morning and at bedtime.   ciprofloxacin-dexamethasone OTIC suspension Commonly known as: CIPRODEX Place 4 drops into the left ear 2 (two) times daily.   CoQ10 200 MG Caps Take 200 mg by mouth in the morning and at bedtime.   ELDERBERRY PO Take 1 tablet by mouth in the morning and at bedtime.   GARLIC 14481PO Take 18,563mg by mouth in the morning and at bedtime.   LECITHIN PO Take 1 tablet by mouth daily.   Lutein 40 MG Caps Take 40 mg by mouth daily.   magnesium oxide 400 MG tablet Commonly known as: MAG-OX Take 400 mg by mouth daily.   metoprolol tartrate 25 MG tablet Commonly known as: LOPRESSOR TAKE 1 TABLET BY MOUTH TWICE A DAY   multivitamin capsule Take 1 capsule by mouth daily.    nitroGLYCERIN 0.4 MG SL tablet Commonly known as: NITROSTAT Place 0.4 mg under the tongue every 5 (five) minutes as needed for chest pain. What changed: Another medication with the same name was removed. Continue taking this medication, and follow the directions you see here.   ondansetron 4 MG tablet Commonly known as: ZOFRAN Take 1 tablet (4 mg total) by mouth every 6 (six) hours as needed for nausea.   oxyCODONE-acetaminophen 5-325 MG tablet Commonly known as: PERCOCET/ROXICET Take 1-2 tablets by mouth every 6 (six) hours as needed for severe pain.   pantoprazole 40 MG tablet Commonly known as: PROTONIX TAKE 1 TABLET BY MOUTH EVERY DAY   rosuvastatin 10 MG tablet Commonly known as: CRESTOR Take 10 mg by mouth daily. What changed: Another medication with the same name was removed. Continue taking this medication, and follow the directions you see here.   ticagrelor 90 MG Tabs tablet Commonly known as: BRILINTA Take 1 tablet (90 mg total) by mouth 2 (two) times daily.   Vitamin  C 500 MG Caps Take 500 mg by mouth daily.   Vitamin D3 50 MCG (2000 UT) capsule Take 2,000 Units by mouth daily.   Zinc 25 MG Tabs Take 25 mg by mouth daily.       Allergies  Allergen Reactions  . Penicillins     PT SPIKED FEVER  . Hydrocodone Nausea And Vomiting  . 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  . Oxycodone Nausea And Vomiting    Consultations:  Orthopedic surgery, PT   Procedures/Studies: CT Head Wo Contrast  Result Date: 09/23/2019 CLINICAL DATA:  Acute headache after a fall. The patient takes blood thinners. EXAM: CT HEAD WITHOUT CONTRAST CT MAXILLOFACIAL WITHOUT CONTRAST CT CERVICAL SPINE WITHOUT CONTRAST TECHNIQUE: Multidetector CT imaging of the head, cervical spine, and maxillofacial structures were performed using the standard protocol without intravenous contrast. Multiplanar CT image reconstructions of the cervical spine and maxillofacial  structures were also generated. COMPARISON:  CT scan of the head dated 11/07/2010 and cervical radiographs dated 10/22/2007 FINDINGS: CT HEAD FINDINGS Brain: No evidence of acute infarction, hemorrhage, hydrocephalus, extra-axial collection or mass lesion/mass effect. Vascular: No hyperdense vessel or unexpected calcification. Skull: Normal. Negative for fracture or focal lesion. Other: Normal. CT MAXILLOFACIAL FINDINGS Osseous: No fracture or mandibular dislocation. No destructive process. Orbits: Negative. No traumatic or inflammatory finding. Sinuses: Clear. Soft tissues: Negative. CT CERVICAL SPINE FINDINGS Alignment: 1 mm spondylolisthesis at C4-5, C7-T1, and T1-2 and T2-3. Otherwise normal. Skull base and vertebrae: No acute fracture. No primary bone lesion or focal pathologic process. Soft tissues and spinal canal: No prevertebral fluid or swelling. No visible canal hematoma. Disc levels: C2-3: Tiny central disc bulge with no neural impingement. Moderate left facet arthritis. No foraminal stenosis. C3-4: Tiny disc osteophyte complex asymmetric to the right without neural impingement. Moderate right foraminal narrowing. Moderate right and mild left facet arthritis. C4-5: Small disc osteophyte complex asymmetric to the left. No foraminal stenosis. Severe left facet arthritis. Disc space narrowing. C5-6: Disc space narrowing. Tiny bilateral uncinate osteophytes. Slight narrowing of the right neural foramen. No facet arthritis. C6-7: Disc space narrowing. No disc bulging or protrusion. No facet arthritis. C7-T1: No disc bulging or protrusion. No foraminal stenosis. No facet arthritis. Upper chest: Negative. Other: None IMPRESSION: 1. Normal CT scan of the head. 2. No acute abnormality of the maxillofacial structures. 3. No acute abnormality of the cervical spine. Multilevel degenerative disc and joint disease. Electronically Signed   By: Lorriane Shire M.D.   On: 09/23/2019 13:53   CT Cervical Spine Wo  Contrast  Result Date: 09/23/2019 CLINICAL DATA:  Acute headache after a fall. The patient takes blood thinners. EXAM: CT HEAD WITHOUT CONTRAST CT MAXILLOFACIAL WITHOUT CONTRAST CT CERVICAL SPINE WITHOUT CONTRAST TECHNIQUE: Multidetector CT imaging of the head, cervical spine, and maxillofacial structures were performed using the standard protocol without intravenous contrast. Multiplanar CT image reconstructions of the cervical spine and maxillofacial structures were also generated. COMPARISON:  CT scan of the head dated 11/07/2010 and cervical radiographs dated 10/22/2007 FINDINGS: CT HEAD FINDINGS Brain: No evidence of acute infarction, hemorrhage, hydrocephalus, extra-axial collection or mass lesion/mass effect. Vascular: No hyperdense vessel or unexpected calcification. Skull: Normal. Negative for fracture or focal lesion. Other: Normal. CT MAXILLOFACIAL FINDINGS Osseous: No fracture or mandibular dislocation. No destructive process. Orbits: Negative. No traumatic or inflammatory finding. Sinuses: Clear. Soft tissues: Negative. CT CERVICAL SPINE FINDINGS Alignment: 1 mm spondylolisthesis at C4-5, C7-T1, and T1-2 and T2-3. Otherwise normal.  Skull base and vertebrae: No acute fracture. No primary bone lesion or focal pathologic process. Soft tissues and spinal canal: No prevertebral fluid or swelling. No visible canal hematoma. Disc levels: C2-3: Tiny central disc bulge with no neural impingement. Moderate left facet arthritis. No foraminal stenosis. C3-4: Tiny disc osteophyte complex asymmetric to the right without neural impingement. Moderate right foraminal narrowing. Moderate right and mild left facet arthritis. C4-5: Small disc osteophyte complex asymmetric to the left. No foraminal stenosis. Severe left facet arthritis. Disc space narrowing. C5-6: Disc space narrowing. Tiny bilateral uncinate osteophytes. Slight narrowing of the right neural foramen. No facet arthritis. C6-7: Disc space narrowing. No disc  bulging or protrusion. No facet arthritis. C7-T1: No disc bulging or protrusion. No foraminal stenosis. No facet arthritis. Upper chest: Negative. Other: None IMPRESSION: 1. Normal CT scan of the head. 2. No acute abnormality of the maxillofacial structures. 3. No acute abnormality of the cervical spine. Multilevel degenerative disc and joint disease. Electronically Signed   By: Lorriane Shire M.D.   On: 09/23/2019 13:53   DG Pelvis Portable  Result Date: 09/23/2019 CLINICAL DATA:  Fall. EXAM: PORTABLE PELVIS 1-2 VIEWS COMPARISON:  CT abdomen pelvis dated April 25, 2014. FINDINGS: There is no evidence of pelvic fracture or diastasis. No pelvic bone lesions are seen. Unchanged mild-to-moderate bilateral hip joint space narrowing. IMPRESSION: 1. No acute osseous abnormality. Electronically Signed   By: Titus Dubin M.D.   On: 09/23/2019 13:59   DG Chest Port 1 View  Result Date: 09/23/2019 CLINICAL DATA:  Recent fall with pain, initial encounter EXAM: PORTABLE CHEST 1 VIEW COMPARISON:  10/24/2018 FINDINGS: Cardiac shadow is stable. Aortic calcifications are noted. The lungs are clear bilaterally. No acute bony abnormality is seen. Oblique fracture through the proximal left humerus is noted IMPRESSION: No acute abnormality in the chest. Proximal left humeral fracture is seen. Electronically Signed   By: Inez Catalina M.D.   On: 09/23/2019 13:59   DG Shoulder Left  Result Date: 09/23/2019 CLINICAL DATA:  Fall EXAM: LEFT SHOULDER - 2+ VIEW COMPARISON:  None. FINDINGS: Oblique fracture through the proximal shaft of the left humerus spanning the surgical neck with mild comminution and displacement. Joint spaces are preserved. No dislocation. IMPRESSION: Acute oblique fracture of the proximal shaft of the left humerus with mild comminution and displacement. Electronically Signed   By: Macy Mis M.D.   On: 09/23/2019 13:39   CT Maxillofacial WO CM  Result Date: 09/23/2019 CLINICAL DATA:  Acute  headache after a fall. The patient takes blood thinners. EXAM: CT HEAD WITHOUT CONTRAST CT MAXILLOFACIAL WITHOUT CONTRAST CT CERVICAL SPINE WITHOUT CONTRAST TECHNIQUE: Multidetector CT imaging of the head, cervical spine, and maxillofacial structures were performed using the standard protocol without intravenous contrast. Multiplanar CT image reconstructions of the cervical spine and maxillofacial structures were also generated. COMPARISON:  CT scan of the head dated 11/07/2010 and cervical radiographs dated 10/22/2007 FINDINGS: CT HEAD FINDINGS Brain: No evidence of acute infarction, hemorrhage, hydrocephalus, extra-axial collection or mass lesion/mass effect. Vascular: No hyperdense vessel or unexpected calcification. Skull: Normal. Negative for fracture or focal lesion. Other: Normal. CT MAXILLOFACIAL FINDINGS Osseous: No fracture or mandibular dislocation. No destructive process. Orbits: Negative. No traumatic or inflammatory finding. Sinuses: Clear. Soft tissues: Negative. CT CERVICAL SPINE FINDINGS Alignment: 1 mm spondylolisthesis at C4-5, C7-T1, and T1-2 and T2-3. Otherwise normal. Skull base and vertebrae: No acute fracture. No primary bone lesion or focal pathologic process. Soft tissues and spinal canal: No prevertebral fluid  or swelling. No visible canal hematoma. Disc levels: C2-3: Tiny central disc bulge with no neural impingement. Moderate left facet arthritis. No foraminal stenosis. C3-4: Tiny disc osteophyte complex asymmetric to the right without neural impingement. Moderate right foraminal narrowing. Moderate right and mild left facet arthritis. C4-5: Small disc osteophyte complex asymmetric to the left. No foraminal stenosis. Severe left facet arthritis. Disc space narrowing. C5-6: Disc space narrowing. Tiny bilateral uncinate osteophytes. Slight narrowing of the right neural foramen. No facet arthritis. C6-7: Disc space narrowing. No disc bulging or protrusion. No facet arthritis. C7-T1: No disc  bulging or protrusion. No foraminal stenosis. No facet arthritis. Upper chest: Negative. Other: None IMPRESSION: 1. Normal CT scan of the head. 2. No acute abnormality of the maxillofacial structures. 3. No acute abnormality of the cervical spine. Multilevel degenerative disc and joint disease. Electronically Signed   By: Lorriane Shire M.D.   On: 09/23/2019 13:53     Subjective: No acute issues or events overnight denies nausea, vomiting, diarrhea, constipation, headache, fevers, chills, chest pain, shortness of breath.   Discharge Exam: Vitals:   09/25/19 2041 09/26/19 0557  BP: (!) 145/77 (!) 153/81  Pulse: 86 76  Resp: 17 18  Temp: 98.7 F (37.1 C) 97.9 F (36.6 C)  SpO2: 99% 96%   Vitals:   09/25/19 0934 09/25/19 1336 09/25/19 2041 09/26/19 0557  BP: 137/74 135/72 (!) 145/77 (!) 153/81  Pulse: 86 81 86 76  Resp: '17 18 17 18  ' Temp: 97.9 F (36.6 C) 98.5 F (36.9 C) 98.7 F (37.1 C) 97.9 F (36.6 C)  TempSrc:  Oral Oral   SpO2: 100% 99% 99% 96%  Weight:      Height:       General:  Pleasantly resting in bed, No acute distress. HEENT: Ecchymosis over the chin/anterior face and left side of the head with noted dry ear in left ear canal. Neck:  Without mass or deformity.  Trachea is midline. Lungs:  Clear to auscultate bilaterally without rhonchi, wheeze, or rales. Heart:  Regular rate and rhythm.  Without murmurs, rubs, or gallops. Abdomen:  Soft, nontender, nondistended.  Without guarding or rebound. Extremities: Left upper extremity in a sling, exquisitely tender over lateral shoulder; otherwise without cyanosis, clubbing, edema, or obvious deformity. Vascular:  Dorsalis pedis and posterior tibial pulses palpable bilaterally. Skin:  Warm and dry, no erythema, no ulcerations.  The results of significant diagnostics from this hospitalization (including imaging, microbiology, ancillary and laboratory) are listed below for reference.     Microbiology: Recent Results  (from the past 240 hour(s))  SARS Coronavirus 2 by RT PCR (hospital order, performed in [facility] hospital lab) Nasopharyngeal Nasopharyngeal Swab     Status: None   Collection Time: 09/24/19  4:33 PM   Specimen: Nasopharyngeal Swab  Result Value Ref Range Status   SARS Coronavirus 2 NEGATIVE NEGATIVE Final    Comment: (NOTE) SARS-CoV-2 target nucleic acids are NOT DETECTED.  The SARS-CoV-2 RNA is generally detectable in upper and lower respiratory specimens during the acute phase of infection. The lowest concentration of SARS-CoV-2 viral copies this assay can detect is 250 copies / mL. A negative result does not preclude SARS-CoV-2 infection and should not be used as the sole basis for treatment or other patient management decisions.  A negative result may occur with improper specimen collection / handling, submission of specimen other than nasopharyngeal swab, presence of viral mutation(s) within the areas targeted by this assay, and inadequate number of viral copies (<  250 copies / mL). A negative result must be combined with clinical observations, patient history, and epidemiological information.  Fact Sheet for Patients:   StrictlyIdeas.no  Fact Sheet for Healthcare Providers: BankingDealers.co.za  This test is not yet approved or  cleared by the [place] FDA and has been authorized for detection and/or diagnosis of SARS-CoV-2 by FDA under an Emergency Use Authorization (EUA).  This EUA will remain in effect (meaning this test can be used) for the duration of the COVID-19 declaration under Section 564(b)(1) of the Act, 21 U.S.C. section 360bbb-3(b)(1), unless the authorization is terminated or revoked sooner.  Performed at [facility], [place] [address], [place], [place] [address]      Labs: BNP (last 3 results) No results for input(s): BNP in the last 8760 hours. Basic Metabolic Panel: Recent Labs   Lab 09/24/19 1351 09/25/19 0300  NA 139 135  K 4.6 3.6  CL 101 106  CO2 24 21*  GLUCOSE 141* 123*  BUN 55* 41*  CREATININE 2.12* 1.31*  CALCIUM 9.5 8.4*   Liver Function Tests: No results for input(s): AST, ALT, ALKPHOS, BILITOT, PROT, ALBUMIN in the last 168 hours. No results for input(s): LIPASE, AMYLASE in the last 168 hours. No results for input(s): AMMONIA in the last 168 hours. CBC: Recent Labs  Lab 09/24/19 1351 09/25/19 0300  WBC 20.0* 16.0*  HGB 12.2 10.1*  HCT 36.5 30.4*  MCV 95.3 95.3  PLT 260 207   Cardiac Enzymes: Recent Labs  Lab 09/24/19 1351  CKTOTAL 99   BNP: Invalid input(s): POCBNP CBG: No results for input(s): GLUCAP in the last 168 hours. D-Dimer No results for input(s): DDIMER in the last 72 hours. Hgb A1c No results for input(s): HGBA1C in the last 72 hours. Lipid Profile No results for input(s): CHOL, HDL, LDLCALC, TRIG, CHOLHDL, LDLDIRECT in the last 72 hours. Thyroid function studies No results for input(s): TSH, T4TOTAL, T3FREE, THYROIDAB in the last 72 hours.  Invalid input(s): FREET3 Anemia work up No results for input(s): VITAMINB12, FOLATE, FERRITIN, TIBC, IRON, RETICCTPCT in the last 72 hours. Urinalysis    Component Value Date/Time   COLORURINE YELLOW 09/25/2019 0527   APPEARANCEUR CLEAR 09/25/2019 0527   LABSPEC 1.012 09/25/2019 0527   PHURINE 6.0 09/25/2019 0527   GLUCOSEU NEGATIVE 09/25/2019 0527   HGBUR SMALL (A) 09/25/2019 0527   BILIRUBINUR NEGATIVE 09/25/2019 0527   BILIRUBINUR neg 06/30/2014 0850   KETONESUR NEGATIVE 09/25/2019 0527   PROTEINUR NEGATIVE 09/25/2019 0527   UROBILINOGEN negative 06/30/2014 0850   UROBILINOGEN 0.2 04/16/2011 2237   NITRITE NEGATIVE 09/25/2019 0527   LEUKOCYTESUR NEGATIVE 09/25/2019 0527   Sepsis Labs Invalid input(s): PROCALCITONIN,  WBC,  LACTICIDVEN Microbiology Recent Results (from the past 240 hour(s))  SARS Coronavirus 2 by RT PCR (hospital order, performed in [place] hospital lab) Nasopharyngeal Nasopharyngeal Swab     Status: None   Collection Time: 09/24/19  4:33 PM   Specimen: Nasopharyngeal Swab  Result Value Ref Range Status   SARS Coronavirus 2 NEGATIVE NEGATIVE Final    Comment: (NOTE) SARS-CoV-2 target nucleic acids are NOT DETECTED.  The SARS-CoV-2 RNA is generally detectable in upper and lower respiratory specimens during the acute phase of infection. The lowest concentration of SARS-CoV-2 viral copies this assay can detect is 250 copies / mL. A negative result does not preclude SARS-CoV-2 infection and should not be used as the sole basis for treatment or other patient management decisions.  A negative result may occur  with improper specimen collection / handling, submission of specimen other than nasopharyngeal swab, presence of viral mutation(s) within the areas targeted by this assay, and inadequate number of viral copies (<250 copies / mL). A negative result must be combined with clinical observations, patient history, and epidemiological information.  Fact Sheet for Patients:   StrictlyIdeas.no  Fact Sheet for Healthcare Providers: BankingDealers.co.za  This test is not yet approved or  cleared by the [place] FDA and has been authorized for detection and/or diagnosis of SARS-CoV-2 by FDA under an Emergency Use Authorization (EUA).  This EUA will remain in effect (meaning this test can be used) for the duration of the COVID-19 declaration under Section 564(b)(1) of the Act, 21 U.S.C. section 360bbb-3(b)(1), unless the authorization is terminated or revoked sooner.  Performed at [facility] [place] [address], [place], [place] [address]      Time coordinating discharge: Over 30 minutes  SIGNED:   Little Ishikawa, DO [facility] 09/26/2019, 12:21 PM Pager   If 7PM-7AM, please contact night-coverage [email or web address]

## 2019-09-26 NOTE — Care Management Important Message (Signed)
Important Message  Patient Details IM Letter given to Lennart Pall SW Case Manager to present to the Patient Name: Tracy Rogers MRN: [id number] Date of Birth: 12-24-1930   Medicare Important Message Given:  Yes     Kerin Salen 09/26/2019, 12:30 PM

## 2019-09-26 NOTE — TOC Transition Note (Signed)
Transition of Care [facility]) - CM/SW Discharge Note   Patient Details  Name: Manroop Jakubowicz MRN: [id number] Date of Birth: Dec 29, 1930  Transition of Care [facility]) CM/SW Contact:  Lennart Pall, LCSW Phone Number: 09/26/2019, 2:54 PM   Clinical Narrative:   Have spoken with pt and niece regarding d/c referrals.  Both aware plan for d/c today and niece has been able to speak with both MD and therapies to get update on pt's functional level and assist needs for home.  HHPT/OT arranged via [facility].  Pt reports they plan to arrange private duty caregivers via [facility] as well.  No DME needs.  Pt ready for d/c.    Final next level of care: [place] Barriers to Discharge: Barriers Resolved   Patient Goals and CMS Choice Patient states their goals for this hospitalization and ongoing recovery are:: go home      Discharge Placement                       Discharge Plan and Services In-house Referral: Clinical Social Work                        HH Arranged: PT, OT [place] Agency: [place] ([facility]) Date HH Agency Contacted: 09/26/19 Time [place]: 1400 Representative spoke with at [place]: [place] ([place]) Interventions     Readmission Risk Interventions No flowsheet data found.

## 2019-09-29 DIAGNOSIS — M810 Age-related osteoporosis without current pathological fracture: Secondary | ICD-10-CM | POA: Diagnosis not present

## 2019-09-29 DIAGNOSIS — E782 Mixed hyperlipidemia: Secondary | ICD-10-CM | POA: Diagnosis not present

## 2019-09-29 DIAGNOSIS — M8580 Other specified disorders of bone density and structure, unspecified site: Secondary | ICD-10-CM | POA: Diagnosis not present

## 2019-09-29 DIAGNOSIS — M5430 Sciatica, unspecified side: Secondary | ICD-10-CM | POA: Diagnosis not present

## 2019-09-29 DIAGNOSIS — N179 Acute kidney failure, unspecified: Secondary | ICD-10-CM | POA: Diagnosis not present

## 2019-09-29 DIAGNOSIS — S42352D Displaced comminuted fracture of shaft of humerus, left arm, subsequent encounter for fracture with routine healing: Secondary | ICD-10-CM | POA: Diagnosis not present

## 2019-09-29 DIAGNOSIS — I444 Left anterior fascicular block: Secondary | ICD-10-CM | POA: Diagnosis not present

## 2019-09-29 DIAGNOSIS — M48061 Spinal stenosis, lumbar region without neurogenic claudication: Secondary | ICD-10-CM | POA: Diagnosis not present

## 2019-09-29 DIAGNOSIS — I10 Essential (primary) hypertension: Secondary | ICD-10-CM | POA: Diagnosis not present

## 2019-09-29 DIAGNOSIS — I2510 Atherosclerotic heart disease of native coronary artery without angina pectoris: Secondary | ICD-10-CM | POA: Diagnosis not present

## 2019-10-01 DIAGNOSIS — S42202A Unspecified fracture of upper end of left humerus, initial encounter for closed fracture: Secondary | ICD-10-CM | POA: Diagnosis not present

## 2019-10-01 DIAGNOSIS — S01312A Laceration without foreign body of left ear, initial encounter: Secondary | ICD-10-CM | POA: Diagnosis not present

## 2019-10-01 DIAGNOSIS — H9212 Otorrhea, left ear: Secondary | ICD-10-CM | POA: Diagnosis not present

## 2019-10-02 ENCOUNTER — Telehealth: Payer: Self-pay

## 2019-10-02 NOTE — Telephone Encounter (Signed)
error 

## 2019-10-03 DIAGNOSIS — I10 Essential (primary) hypertension: Secondary | ICD-10-CM | POA: Diagnosis not present

## 2019-10-03 DIAGNOSIS — I444 Left anterior fascicular block: Secondary | ICD-10-CM | POA: Diagnosis not present

## 2019-10-03 DIAGNOSIS — M5430 Sciatica, unspecified side: Secondary | ICD-10-CM | POA: Diagnosis not present

## 2019-10-03 DIAGNOSIS — M48061 Spinal stenosis, lumbar region without neurogenic claudication: Secondary | ICD-10-CM | POA: Diagnosis not present

## 2019-10-03 DIAGNOSIS — N179 Acute kidney failure, unspecified: Secondary | ICD-10-CM | POA: Diagnosis not present

## 2019-10-03 DIAGNOSIS — M810 Age-related osteoporosis without current pathological fracture: Secondary | ICD-10-CM | POA: Diagnosis not present

## 2019-10-03 DIAGNOSIS — M8580 Other specified disorders of bone density and structure, unspecified site: Secondary | ICD-10-CM | POA: Diagnosis not present

## 2019-10-03 DIAGNOSIS — E782 Mixed hyperlipidemia: Secondary | ICD-10-CM | POA: Diagnosis not present

## 2019-10-03 DIAGNOSIS — I2510 Atherosclerotic heart disease of native coronary artery without angina pectoris: Secondary | ICD-10-CM | POA: Diagnosis not present

## 2019-10-03 DIAGNOSIS — S42352D Displaced comminuted fracture of shaft of humerus, left arm, subsequent encounter for fracture with routine healing: Secondary | ICD-10-CM | POA: Diagnosis not present

## 2019-10-06 DIAGNOSIS — S01312A Laceration without foreign body of left ear, initial encounter: Secondary | ICD-10-CM | POA: Diagnosis not present

## 2019-10-06 DIAGNOSIS — S42335A Nondisplaced oblique fracture of shaft of humerus, left arm, initial encounter for closed fracture: Secondary | ICD-10-CM | POA: Diagnosis not present

## 2019-10-06 DIAGNOSIS — D72823 Leukemoid reaction: Secondary | ICD-10-CM | POA: Diagnosis not present

## 2019-10-06 DIAGNOSIS — H9212 Otorrhea, left ear: Secondary | ICD-10-CM | POA: Diagnosis not present

## 2019-10-06 DIAGNOSIS — E782 Mixed hyperlipidemia: Secondary | ICD-10-CM | POA: Diagnosis not present

## 2019-10-06 DIAGNOSIS — W19XXXA Unspecified fall, initial encounter: Secondary | ICD-10-CM | POA: Diagnosis not present

## 2019-10-06 DIAGNOSIS — N1832 Chronic kidney disease, stage 3b: Secondary | ICD-10-CM | POA: Diagnosis not present

## 2019-10-06 DIAGNOSIS — Z5181 Encounter for therapeutic drug level monitoring: Secondary | ICD-10-CM | POA: Diagnosis not present

## 2019-10-06 DIAGNOSIS — Z955 Presence of coronary angioplasty implant and graft: Secondary | ICD-10-CM | POA: Diagnosis not present

## 2019-10-07 DIAGNOSIS — M5430 Sciatica, unspecified side: Secondary | ICD-10-CM | POA: Diagnosis not present

## 2019-10-07 DIAGNOSIS — I2510 Atherosclerotic heart disease of native coronary artery without angina pectoris: Secondary | ICD-10-CM | POA: Diagnosis not present

## 2019-10-07 DIAGNOSIS — E782 Mixed hyperlipidemia: Secondary | ICD-10-CM | POA: Diagnosis not present

## 2019-10-07 DIAGNOSIS — N179 Acute kidney failure, unspecified: Secondary | ICD-10-CM | POA: Diagnosis not present

## 2019-10-07 DIAGNOSIS — I10 Essential (primary) hypertension: Secondary | ICD-10-CM | POA: Diagnosis not present

## 2019-10-07 DIAGNOSIS — M48061 Spinal stenosis, lumbar region without neurogenic claudication: Secondary | ICD-10-CM | POA: Diagnosis not present

## 2019-10-07 DIAGNOSIS — M8580 Other specified disorders of bone density and structure, unspecified site: Secondary | ICD-10-CM | POA: Diagnosis not present

## 2019-10-07 DIAGNOSIS — M810 Age-related osteoporosis without current pathological fracture: Secondary | ICD-10-CM | POA: Diagnosis not present

## 2019-10-07 DIAGNOSIS — I444 Left anterior fascicular block: Secondary | ICD-10-CM | POA: Diagnosis not present

## 2019-10-07 DIAGNOSIS — S42352D Displaced comminuted fracture of shaft of humerus, left arm, subsequent encounter for fracture with routine healing: Secondary | ICD-10-CM | POA: Diagnosis not present

## 2019-10-08 DIAGNOSIS — N179 Acute kidney failure, unspecified: Secondary | ICD-10-CM | POA: Diagnosis not present

## 2019-10-08 DIAGNOSIS — I2510 Atherosclerotic heart disease of native coronary artery without angina pectoris: Secondary | ICD-10-CM | POA: Diagnosis not present

## 2019-10-08 DIAGNOSIS — M48061 Spinal stenosis, lumbar region without neurogenic claudication: Secondary | ICD-10-CM | POA: Diagnosis not present

## 2019-10-08 DIAGNOSIS — M5430 Sciatica, unspecified side: Secondary | ICD-10-CM | POA: Diagnosis not present

## 2019-10-08 DIAGNOSIS — I444 Left anterior fascicular block: Secondary | ICD-10-CM | POA: Diagnosis not present

## 2019-10-08 DIAGNOSIS — S42352D Displaced comminuted fracture of shaft of humerus, left arm, subsequent encounter for fracture with routine healing: Secondary | ICD-10-CM | POA: Diagnosis not present

## 2019-10-08 DIAGNOSIS — E782 Mixed hyperlipidemia: Secondary | ICD-10-CM | POA: Diagnosis not present

## 2019-10-08 DIAGNOSIS — M8580 Other specified disorders of bone density and structure, unspecified site: Secondary | ICD-10-CM | POA: Diagnosis not present

## 2019-10-08 DIAGNOSIS — M810 Age-related osteoporosis without current pathological fracture: Secondary | ICD-10-CM | POA: Diagnosis not present

## 2019-10-08 DIAGNOSIS — I10 Essential (primary) hypertension: Secondary | ICD-10-CM | POA: Diagnosis not present

## 2019-10-16 DIAGNOSIS — E782 Mixed hyperlipidemia: Secondary | ICD-10-CM | POA: Diagnosis not present

## 2019-10-16 DIAGNOSIS — M8580 Other specified disorders of bone density and structure, unspecified site: Secondary | ICD-10-CM | POA: Diagnosis not present

## 2019-10-16 DIAGNOSIS — S42352D Displaced comminuted fracture of shaft of humerus, left arm, subsequent encounter for fracture with routine healing: Secondary | ICD-10-CM | POA: Diagnosis not present

## 2019-10-16 DIAGNOSIS — M810 Age-related osteoporosis without current pathological fracture: Secondary | ICD-10-CM | POA: Diagnosis not present

## 2019-10-16 DIAGNOSIS — I2510 Atherosclerotic heart disease of native coronary artery without angina pectoris: Secondary | ICD-10-CM | POA: Diagnosis not present

## 2019-10-16 DIAGNOSIS — N179 Acute kidney failure, unspecified: Secondary | ICD-10-CM | POA: Diagnosis not present

## 2019-10-16 DIAGNOSIS — M5430 Sciatica, unspecified side: Secondary | ICD-10-CM | POA: Diagnosis not present

## 2019-10-16 DIAGNOSIS — M48061 Spinal stenosis, lumbar region without neurogenic claudication: Secondary | ICD-10-CM | POA: Diagnosis not present

## 2019-10-16 DIAGNOSIS — I444 Left anterior fascicular block: Secondary | ICD-10-CM | POA: Diagnosis not present

## 2019-10-16 DIAGNOSIS — I10 Essential (primary) hypertension: Secondary | ICD-10-CM | POA: Diagnosis not present

## 2019-10-17 DIAGNOSIS — M25512 Pain in left shoulder: Secondary | ICD-10-CM | POA: Diagnosis not present

## 2019-10-20 DIAGNOSIS — M810 Age-related osteoporosis without current pathological fracture: Secondary | ICD-10-CM | POA: Diagnosis not present

## 2019-10-20 DIAGNOSIS — M48061 Spinal stenosis, lumbar region without neurogenic claudication: Secondary | ICD-10-CM | POA: Diagnosis not present

## 2019-10-20 DIAGNOSIS — N179 Acute kidney failure, unspecified: Secondary | ICD-10-CM | POA: Diagnosis not present

## 2019-10-20 DIAGNOSIS — E782 Mixed hyperlipidemia: Secondary | ICD-10-CM | POA: Diagnosis not present

## 2019-10-20 DIAGNOSIS — M8580 Other specified disorders of bone density and structure, unspecified site: Secondary | ICD-10-CM | POA: Diagnosis not present

## 2019-10-20 DIAGNOSIS — M5430 Sciatica, unspecified side: Secondary | ICD-10-CM | POA: Diagnosis not present

## 2019-10-20 DIAGNOSIS — I444 Left anterior fascicular block: Secondary | ICD-10-CM | POA: Diagnosis not present

## 2019-10-20 DIAGNOSIS — I10 Essential (primary) hypertension: Secondary | ICD-10-CM | POA: Diagnosis not present

## 2019-10-20 DIAGNOSIS — S42352D Displaced comminuted fracture of shaft of humerus, left arm, subsequent encounter for fracture with routine healing: Secondary | ICD-10-CM | POA: Diagnosis not present

## 2019-10-20 DIAGNOSIS — I2510 Atherosclerotic heart disease of native coronary artery without angina pectoris: Secondary | ICD-10-CM | POA: Diagnosis not present

## 2019-10-22 DIAGNOSIS — M8580 Other specified disorders of bone density and structure, unspecified site: Secondary | ICD-10-CM | POA: Diagnosis not present

## 2019-10-22 DIAGNOSIS — I444 Left anterior fascicular block: Secondary | ICD-10-CM | POA: Diagnosis not present

## 2019-10-22 DIAGNOSIS — I2510 Atherosclerotic heart disease of native coronary artery without angina pectoris: Secondary | ICD-10-CM | POA: Diagnosis not present

## 2019-10-22 DIAGNOSIS — M48061 Spinal stenosis, lumbar region without neurogenic claudication: Secondary | ICD-10-CM | POA: Diagnosis not present

## 2019-10-22 DIAGNOSIS — E782 Mixed hyperlipidemia: Secondary | ICD-10-CM | POA: Diagnosis not present

## 2019-10-22 DIAGNOSIS — I10 Essential (primary) hypertension: Secondary | ICD-10-CM | POA: Diagnosis not present

## 2019-10-22 DIAGNOSIS — M810 Age-related osteoporosis without current pathological fracture: Secondary | ICD-10-CM | POA: Diagnosis not present

## 2019-10-22 DIAGNOSIS — M5430 Sciatica, unspecified side: Secondary | ICD-10-CM | POA: Diagnosis not present

## 2019-10-22 DIAGNOSIS — S42352D Displaced comminuted fracture of shaft of humerus, left arm, subsequent encounter for fracture with routine healing: Secondary | ICD-10-CM | POA: Diagnosis not present

## 2019-10-22 DIAGNOSIS — N179 Acute kidney failure, unspecified: Secondary | ICD-10-CM | POA: Diagnosis not present

## 2019-10-27 DIAGNOSIS — M5430 Sciatica, unspecified side: Secondary | ICD-10-CM | POA: Diagnosis not present

## 2019-10-27 DIAGNOSIS — S42352D Displaced comminuted fracture of shaft of humerus, left arm, subsequent encounter for fracture with routine healing: Secondary | ICD-10-CM | POA: Diagnosis not present

## 2019-10-27 DIAGNOSIS — M8580 Other specified disorders of bone density and structure, unspecified site: Secondary | ICD-10-CM | POA: Diagnosis not present

## 2019-10-27 DIAGNOSIS — N179 Acute kidney failure, unspecified: Secondary | ICD-10-CM | POA: Diagnosis not present

## 2019-10-27 DIAGNOSIS — I444 Left anterior fascicular block: Secondary | ICD-10-CM | POA: Diagnosis not present

## 2019-10-27 DIAGNOSIS — M810 Age-related osteoporosis without current pathological fracture: Secondary | ICD-10-CM | POA: Diagnosis not present

## 2019-10-27 DIAGNOSIS — M48061 Spinal stenosis, lumbar region without neurogenic claudication: Secondary | ICD-10-CM | POA: Diagnosis not present

## 2019-10-27 DIAGNOSIS — E782 Mixed hyperlipidemia: Secondary | ICD-10-CM | POA: Diagnosis not present

## 2019-10-27 DIAGNOSIS — I2510 Atherosclerotic heart disease of native coronary artery without angina pectoris: Secondary | ICD-10-CM | POA: Diagnosis not present

## 2019-10-27 DIAGNOSIS — I10 Essential (primary) hypertension: Secondary | ICD-10-CM | POA: Diagnosis not present

## 2019-10-29 DIAGNOSIS — N179 Acute kidney failure, unspecified: Secondary | ICD-10-CM | POA: Diagnosis not present

## 2019-10-29 DIAGNOSIS — M810 Age-related osteoporosis without current pathological fracture: Secondary | ICD-10-CM | POA: Diagnosis not present

## 2019-10-29 DIAGNOSIS — I444 Left anterior fascicular block: Secondary | ICD-10-CM | POA: Diagnosis not present

## 2019-10-29 DIAGNOSIS — E782 Mixed hyperlipidemia: Secondary | ICD-10-CM | POA: Diagnosis not present

## 2019-10-29 DIAGNOSIS — M8580 Other specified disorders of bone density and structure, unspecified site: Secondary | ICD-10-CM | POA: Diagnosis not present

## 2019-10-29 DIAGNOSIS — I10 Essential (primary) hypertension: Secondary | ICD-10-CM | POA: Diagnosis not present

## 2019-10-29 DIAGNOSIS — S42352D Displaced comminuted fracture of shaft of humerus, left arm, subsequent encounter for fracture with routine healing: Secondary | ICD-10-CM | POA: Diagnosis not present

## 2019-10-29 DIAGNOSIS — M5430 Sciatica, unspecified side: Secondary | ICD-10-CM | POA: Diagnosis not present

## 2019-10-29 DIAGNOSIS — M48061 Spinal stenosis, lumbar region without neurogenic claudication: Secondary | ICD-10-CM | POA: Diagnosis not present

## 2019-10-29 DIAGNOSIS — I2510 Atherosclerotic heart disease of native coronary artery without angina pectoris: Secondary | ICD-10-CM | POA: Diagnosis not present

## 2019-11-04 DIAGNOSIS — D72823 Leukemoid reaction: Secondary | ICD-10-CM | POA: Diagnosis not present

## 2019-11-04 DIAGNOSIS — E782 Mixed hyperlipidemia: Secondary | ICD-10-CM | POA: Diagnosis not present

## 2019-11-04 DIAGNOSIS — E559 Vitamin D deficiency, unspecified: Secondary | ICD-10-CM | POA: Diagnosis not present

## 2019-11-05 DIAGNOSIS — E782 Mixed hyperlipidemia: Secondary | ICD-10-CM | POA: Diagnosis not present

## 2019-11-05 DIAGNOSIS — M5430 Sciatica, unspecified side: Secondary | ICD-10-CM | POA: Diagnosis not present

## 2019-11-05 DIAGNOSIS — I2510 Atherosclerotic heart disease of native coronary artery without angina pectoris: Secondary | ICD-10-CM | POA: Diagnosis not present

## 2019-11-05 DIAGNOSIS — M810 Age-related osteoporosis without current pathological fracture: Secondary | ICD-10-CM | POA: Diagnosis not present

## 2019-11-05 DIAGNOSIS — I10 Essential (primary) hypertension: Secondary | ICD-10-CM | POA: Diagnosis not present

## 2019-11-05 DIAGNOSIS — S42352D Displaced comminuted fracture of shaft of humerus, left arm, subsequent encounter for fracture with routine healing: Secondary | ICD-10-CM | POA: Diagnosis not present

## 2019-11-05 DIAGNOSIS — M48061 Spinal stenosis, lumbar region without neurogenic claudication: Secondary | ICD-10-CM | POA: Diagnosis not present

## 2019-11-05 DIAGNOSIS — M8580 Other specified disorders of bone density and structure, unspecified site: Secondary | ICD-10-CM | POA: Diagnosis not present

## 2019-11-05 DIAGNOSIS — I444 Left anterior fascicular block: Secondary | ICD-10-CM | POA: Diagnosis not present

## 2019-11-05 DIAGNOSIS — N179 Acute kidney failure, unspecified: Secondary | ICD-10-CM | POA: Diagnosis not present

## 2019-11-06 DIAGNOSIS — Z5181 Encounter for therapeutic drug level monitoring: Secondary | ICD-10-CM | POA: Diagnosis not present

## 2019-11-06 DIAGNOSIS — E782 Mixed hyperlipidemia: Secondary | ICD-10-CM | POA: Diagnosis not present

## 2019-11-06 DIAGNOSIS — E559 Vitamin D deficiency, unspecified: Secondary | ICD-10-CM | POA: Diagnosis not present

## 2019-11-06 DIAGNOSIS — Z955 Presence of coronary angioplasty implant and graft: Secondary | ICD-10-CM | POA: Diagnosis not present

## 2019-11-06 DIAGNOSIS — N1832 Chronic kidney disease, stage 3b: Secondary | ICD-10-CM | POA: Diagnosis not present

## 2019-11-06 DIAGNOSIS — D72823 Leukemoid reaction: Secondary | ICD-10-CM | POA: Diagnosis not present

## 2019-11-08 DIAGNOSIS — I10 Essential (primary) hypertension: Secondary | ICD-10-CM | POA: Diagnosis not present

## 2019-11-08 DIAGNOSIS — I2510 Atherosclerotic heart disease of native coronary artery without angina pectoris: Secondary | ICD-10-CM | POA: Diagnosis not present

## 2019-11-08 DIAGNOSIS — I444 Left anterior fascicular block: Secondary | ICD-10-CM | POA: Diagnosis not present

## 2019-11-08 DIAGNOSIS — N179 Acute kidney failure, unspecified: Secondary | ICD-10-CM | POA: Diagnosis not present

## 2019-11-08 DIAGNOSIS — M5430 Sciatica, unspecified side: Secondary | ICD-10-CM | POA: Diagnosis not present

## 2019-11-08 DIAGNOSIS — M8580 Other specified disorders of bone density and structure, unspecified site: Secondary | ICD-10-CM | POA: Diagnosis not present

## 2019-11-08 DIAGNOSIS — M48061 Spinal stenosis, lumbar region without neurogenic claudication: Secondary | ICD-10-CM | POA: Diagnosis not present

## 2019-11-08 DIAGNOSIS — E782 Mixed hyperlipidemia: Secondary | ICD-10-CM | POA: Diagnosis not present

## 2019-11-08 DIAGNOSIS — M810 Age-related osteoporosis without current pathological fracture: Secondary | ICD-10-CM | POA: Diagnosis not present

## 2019-11-08 DIAGNOSIS — S42352D Displaced comminuted fracture of shaft of humerus, left arm, subsequent encounter for fracture with routine healing: Secondary | ICD-10-CM | POA: Diagnosis not present

## 2019-11-10 DIAGNOSIS — I10 Essential (primary) hypertension: Secondary | ICD-10-CM | POA: Diagnosis not present

## 2019-11-10 DIAGNOSIS — M810 Age-related osteoporosis without current pathological fracture: Secondary | ICD-10-CM | POA: Diagnosis not present

## 2019-11-10 DIAGNOSIS — I444 Left anterior fascicular block: Secondary | ICD-10-CM | POA: Diagnosis not present

## 2019-11-10 DIAGNOSIS — M48061 Spinal stenosis, lumbar region without neurogenic claudication: Secondary | ICD-10-CM | POA: Diagnosis not present

## 2019-11-10 DIAGNOSIS — M5430 Sciatica, unspecified side: Secondary | ICD-10-CM | POA: Diagnosis not present

## 2019-11-10 DIAGNOSIS — I2510 Atherosclerotic heart disease of native coronary artery without angina pectoris: Secondary | ICD-10-CM | POA: Diagnosis not present

## 2019-11-10 DIAGNOSIS — E782 Mixed hyperlipidemia: Secondary | ICD-10-CM | POA: Diagnosis not present

## 2019-11-10 DIAGNOSIS — S42352D Displaced comminuted fracture of shaft of humerus, left arm, subsequent encounter for fracture with routine healing: Secondary | ICD-10-CM | POA: Diagnosis not present

## 2019-11-10 DIAGNOSIS — M8580 Other specified disorders of bone density and structure, unspecified site: Secondary | ICD-10-CM | POA: Diagnosis not present

## 2019-11-10 DIAGNOSIS — N179 Acute kidney failure, unspecified: Secondary | ICD-10-CM | POA: Diagnosis not present

## 2019-11-11 DIAGNOSIS — S01312A Laceration without foreign body of left ear, initial encounter: Secondary | ICD-10-CM | POA: Diagnosis not present

## 2019-11-12 DIAGNOSIS — M8580 Other specified disorders of bone density and structure, unspecified site: Secondary | ICD-10-CM | POA: Diagnosis not present

## 2019-11-12 DIAGNOSIS — M5430 Sciatica, unspecified side: Secondary | ICD-10-CM | POA: Diagnosis not present

## 2019-11-12 DIAGNOSIS — N179 Acute kidney failure, unspecified: Secondary | ICD-10-CM | POA: Diagnosis not present

## 2019-11-12 DIAGNOSIS — M810 Age-related osteoporosis without current pathological fracture: Secondary | ICD-10-CM | POA: Diagnosis not present

## 2019-11-12 DIAGNOSIS — I2510 Atherosclerotic heart disease of native coronary artery without angina pectoris: Secondary | ICD-10-CM | POA: Diagnosis not present

## 2019-11-12 DIAGNOSIS — I444 Left anterior fascicular block: Secondary | ICD-10-CM | POA: Diagnosis not present

## 2019-11-12 DIAGNOSIS — M48061 Spinal stenosis, lumbar region without neurogenic claudication: Secondary | ICD-10-CM | POA: Diagnosis not present

## 2019-11-12 DIAGNOSIS — E782 Mixed hyperlipidemia: Secondary | ICD-10-CM | POA: Diagnosis not present

## 2019-11-12 DIAGNOSIS — S42352D Displaced comminuted fracture of shaft of humerus, left arm, subsequent encounter for fracture with routine healing: Secondary | ICD-10-CM | POA: Diagnosis not present

## 2019-11-12 DIAGNOSIS — I10 Essential (primary) hypertension: Secondary | ICD-10-CM | POA: Diagnosis not present

## 2019-11-15 DIAGNOSIS — N183 Chronic kidney disease, stage 3 unspecified: Secondary | ICD-10-CM | POA: Diagnosis not present

## 2019-11-15 DIAGNOSIS — I214 Non-ST elevation (NSTEMI) myocardial infarction: Secondary | ICD-10-CM | POA: Diagnosis not present

## 2019-11-15 DIAGNOSIS — E782 Mixed hyperlipidemia: Secondary | ICD-10-CM | POA: Diagnosis not present

## 2019-11-15 DIAGNOSIS — N1832 Chronic kidney disease, stage 3b: Secondary | ICD-10-CM | POA: Diagnosis not present

## 2019-11-15 DIAGNOSIS — I10 Essential (primary) hypertension: Secondary | ICD-10-CM | POA: Diagnosis not present

## 2019-11-15 DIAGNOSIS — M810 Age-related osteoporosis without current pathological fracture: Secondary | ICD-10-CM | POA: Diagnosis not present

## 2019-11-17 DIAGNOSIS — M25512 Pain in left shoulder: Secondary | ICD-10-CM | POA: Diagnosis not present

## 2019-11-19 DIAGNOSIS — E782 Mixed hyperlipidemia: Secondary | ICD-10-CM | POA: Diagnosis not present

## 2019-11-19 DIAGNOSIS — M8580 Other specified disorders of bone density and structure, unspecified site: Secondary | ICD-10-CM | POA: Diagnosis not present

## 2019-11-19 DIAGNOSIS — S42352D Displaced comminuted fracture of shaft of humerus, left arm, subsequent encounter for fracture with routine healing: Secondary | ICD-10-CM | POA: Diagnosis not present

## 2019-11-19 DIAGNOSIS — I2510 Atherosclerotic heart disease of native coronary artery without angina pectoris: Secondary | ICD-10-CM | POA: Diagnosis not present

## 2019-11-19 DIAGNOSIS — N179 Acute kidney failure, unspecified: Secondary | ICD-10-CM | POA: Diagnosis not present

## 2019-11-19 DIAGNOSIS — I444 Left anterior fascicular block: Secondary | ICD-10-CM | POA: Diagnosis not present

## 2019-11-19 DIAGNOSIS — M810 Age-related osteoporosis without current pathological fracture: Secondary | ICD-10-CM | POA: Diagnosis not present

## 2019-11-19 DIAGNOSIS — M5430 Sciatica, unspecified side: Secondary | ICD-10-CM | POA: Diagnosis not present

## 2019-11-19 DIAGNOSIS — M48061 Spinal stenosis, lumbar region without neurogenic claudication: Secondary | ICD-10-CM | POA: Diagnosis not present

## 2019-11-19 DIAGNOSIS — I10 Essential (primary) hypertension: Secondary | ICD-10-CM | POA: Diagnosis not present

## 2019-11-20 ENCOUNTER — Other Ambulatory Visit: Payer: Self-pay | Admitting: Cardiology

## 2019-11-21 DIAGNOSIS — E782 Mixed hyperlipidemia: Secondary | ICD-10-CM | POA: Diagnosis not present

## 2019-11-21 DIAGNOSIS — I10 Essential (primary) hypertension: Secondary | ICD-10-CM | POA: Diagnosis not present

## 2019-11-21 DIAGNOSIS — M8580 Other specified disorders of bone density and structure, unspecified site: Secondary | ICD-10-CM | POA: Diagnosis not present

## 2019-11-21 DIAGNOSIS — M5430 Sciatica, unspecified side: Secondary | ICD-10-CM | POA: Diagnosis not present

## 2019-11-21 DIAGNOSIS — I444 Left anterior fascicular block: Secondary | ICD-10-CM | POA: Diagnosis not present

## 2019-11-21 DIAGNOSIS — M810 Age-related osteoporosis without current pathological fracture: Secondary | ICD-10-CM | POA: Diagnosis not present

## 2019-11-21 DIAGNOSIS — I2510 Atherosclerotic heart disease of native coronary artery without angina pectoris: Secondary | ICD-10-CM | POA: Diagnosis not present

## 2019-11-21 DIAGNOSIS — M48061 Spinal stenosis, lumbar region without neurogenic claudication: Secondary | ICD-10-CM | POA: Diagnosis not present

## 2019-11-21 DIAGNOSIS — N179 Acute kidney failure, unspecified: Secondary | ICD-10-CM | POA: Diagnosis not present

## 2019-11-21 DIAGNOSIS — S42352D Displaced comminuted fracture of shaft of humerus, left arm, subsequent encounter for fracture with routine healing: Secondary | ICD-10-CM | POA: Diagnosis not present

## 2019-11-26 DIAGNOSIS — I2510 Atherosclerotic heart disease of native coronary artery without angina pectoris: Secondary | ICD-10-CM | POA: Diagnosis not present

## 2019-11-26 DIAGNOSIS — M8580 Other specified disorders of bone density and structure, unspecified site: Secondary | ICD-10-CM | POA: Diagnosis not present

## 2019-11-26 DIAGNOSIS — I10 Essential (primary) hypertension: Secondary | ICD-10-CM | POA: Diagnosis not present

## 2019-11-26 DIAGNOSIS — M48061 Spinal stenosis, lumbar region without neurogenic claudication: Secondary | ICD-10-CM | POA: Diagnosis not present

## 2019-11-26 DIAGNOSIS — I444 Left anterior fascicular block: Secondary | ICD-10-CM | POA: Diagnosis not present

## 2019-11-26 DIAGNOSIS — E782 Mixed hyperlipidemia: Secondary | ICD-10-CM | POA: Diagnosis not present

## 2019-11-26 DIAGNOSIS — M810 Age-related osteoporosis without current pathological fracture: Secondary | ICD-10-CM | POA: Diagnosis not present

## 2019-11-26 DIAGNOSIS — S42352D Displaced comminuted fracture of shaft of humerus, left arm, subsequent encounter for fracture with routine healing: Secondary | ICD-10-CM | POA: Diagnosis not present

## 2019-11-26 DIAGNOSIS — M5430 Sciatica, unspecified side: Secondary | ICD-10-CM | POA: Diagnosis not present

## 2019-11-26 DIAGNOSIS — N179 Acute kidney failure, unspecified: Secondary | ICD-10-CM | POA: Diagnosis not present

## 2019-12-10 DIAGNOSIS — I214 Non-ST elevation (NSTEMI) myocardial infarction: Secondary | ICD-10-CM | POA: Diagnosis not present

## 2019-12-10 DIAGNOSIS — I10 Essential (primary) hypertension: Secondary | ICD-10-CM | POA: Diagnosis not present

## 2019-12-10 DIAGNOSIS — N183 Chronic kidney disease, stage 3 unspecified: Secondary | ICD-10-CM | POA: Diagnosis not present

## 2019-12-10 DIAGNOSIS — M810 Age-related osteoporosis without current pathological fracture: Secondary | ICD-10-CM | POA: Diagnosis not present

## 2019-12-10 DIAGNOSIS — N1832 Chronic kidney disease, stage 3b: Secondary | ICD-10-CM | POA: Diagnosis not present

## 2019-12-10 DIAGNOSIS — E782 Mixed hyperlipidemia: Secondary | ICD-10-CM | POA: Diagnosis not present

## 2019-12-22 DIAGNOSIS — S42202D Unspecified fracture of upper end of left humerus, subsequent encounter for fracture with routine healing: Secondary | ICD-10-CM | POA: Diagnosis not present

## 2019-12-22 DIAGNOSIS — M25512 Pain in left shoulder: Secondary | ICD-10-CM | POA: Diagnosis not present

## 2019-12-30 DIAGNOSIS — S01312A Laceration without foreign body of left ear, initial encounter: Secondary | ICD-10-CM | POA: Diagnosis not present

## 2020-01-06 DIAGNOSIS — N183 Chronic kidney disease, stage 3 unspecified: Secondary | ICD-10-CM | POA: Diagnosis not present

## 2020-01-06 DIAGNOSIS — I10 Essential (primary) hypertension: Secondary | ICD-10-CM | POA: Diagnosis not present

## 2020-01-06 DIAGNOSIS — M810 Age-related osteoporosis without current pathological fracture: Secondary | ICD-10-CM | POA: Diagnosis not present

## 2020-01-06 DIAGNOSIS — I214 Non-ST elevation (NSTEMI) myocardial infarction: Secondary | ICD-10-CM | POA: Diagnosis not present

## 2020-01-06 DIAGNOSIS — N1832 Chronic kidney disease, stage 3b: Secondary | ICD-10-CM | POA: Diagnosis not present

## 2020-01-06 DIAGNOSIS — E782 Mixed hyperlipidemia: Secondary | ICD-10-CM | POA: Diagnosis not present

## 2020-02-13 ENCOUNTER — Ambulatory Visit: Payer: Medicare HMO | Admitting: Cardiology

## 2020-03-02 DIAGNOSIS — R69 Illness, unspecified: Secondary | ICD-10-CM | POA: Diagnosis not present

## 2020-03-08 DIAGNOSIS — I10 Essential (primary) hypertension: Secondary | ICD-10-CM | POA: Diagnosis not present

## 2020-03-08 DIAGNOSIS — N1832 Chronic kidney disease, stage 3b: Secondary | ICD-10-CM | POA: Diagnosis not present

## 2020-03-08 DIAGNOSIS — N183 Chronic kidney disease, stage 3 unspecified: Secondary | ICD-10-CM | POA: Diagnosis not present

## 2020-03-08 DIAGNOSIS — E782 Mixed hyperlipidemia: Secondary | ICD-10-CM | POA: Diagnosis not present

## 2020-03-08 DIAGNOSIS — E559 Vitamin D deficiency, unspecified: Secondary | ICD-10-CM | POA: Diagnosis not present

## 2020-03-08 DIAGNOSIS — M810 Age-related osteoporosis without current pathological fracture: Secondary | ICD-10-CM | POA: Diagnosis not present

## 2020-03-11 DIAGNOSIS — N1832 Chronic kidney disease, stage 3b: Secondary | ICD-10-CM | POA: Diagnosis not present

## 2020-03-11 DIAGNOSIS — N183 Chronic kidney disease, stage 3 unspecified: Secondary | ICD-10-CM | POA: Diagnosis not present

## 2020-03-11 DIAGNOSIS — E782 Mixed hyperlipidemia: Secondary | ICD-10-CM | POA: Diagnosis not present

## 2020-03-11 DIAGNOSIS — E559 Vitamin D deficiency, unspecified: Secondary | ICD-10-CM | POA: Diagnosis not present

## 2020-03-11 DIAGNOSIS — I10 Essential (primary) hypertension: Secondary | ICD-10-CM | POA: Diagnosis not present

## 2020-03-11 DIAGNOSIS — M810 Age-related osteoporosis without current pathological fracture: Secondary | ICD-10-CM | POA: Diagnosis not present

## 2020-03-16 DIAGNOSIS — I10 Essential (primary) hypertension: Secondary | ICD-10-CM | POA: Diagnosis not present

## 2020-03-16 DIAGNOSIS — N1832 Chronic kidney disease, stage 3b: Secondary | ICD-10-CM | POA: Diagnosis not present

## 2020-03-16 DIAGNOSIS — N183 Chronic kidney disease, stage 3 unspecified: Secondary | ICD-10-CM | POA: Diagnosis not present

## 2020-03-16 DIAGNOSIS — M810 Age-related osteoporosis without current pathological fracture: Secondary | ICD-10-CM | POA: Diagnosis not present

## 2020-03-16 DIAGNOSIS — E782 Mixed hyperlipidemia: Secondary | ICD-10-CM | POA: Diagnosis not present

## 2020-03-16 DIAGNOSIS — I214 Non-ST elevation (NSTEMI) myocardial infarction: Secondary | ICD-10-CM | POA: Diagnosis not present

## 2020-03-17 ENCOUNTER — Ambulatory Visit: Payer: Medicare HMO | Admitting: Cardiology

## 2020-03-17 ENCOUNTER — Encounter: Payer: Self-pay | Admitting: Cardiology

## 2020-03-17 ENCOUNTER — Emergency Department (HOSPITAL_COMMUNITY): Payer: Medicare HMO

## 2020-03-17 ENCOUNTER — Inpatient Hospital Stay (HOSPITAL_COMMUNITY)
Admission: EM | Admit: 2020-03-17 | Discharge: 2020-03-23 | DRG: 522 | Disposition: A | Discharge status: Home Health | Payer: Medicare HMO | Attending: Internal Medicine | Admitting: Internal Medicine

## 2020-03-17 ENCOUNTER — Other Ambulatory Visit: Payer: Self-pay

## 2020-03-17 ENCOUNTER — Encounter (HOSPITAL_COMMUNITY): Payer: Self-pay

## 2020-03-17 VITALS — BP 142/79 | HR 76 | Ht 61.0 in | Wt 122.0 lb

## 2020-03-17 DIAGNOSIS — E876 Hypokalemia: Secondary | ICD-10-CM | POA: Diagnosis present

## 2020-03-17 DIAGNOSIS — R600 Localized edema: Secondary | ICD-10-CM | POA: Diagnosis not present

## 2020-03-17 DIAGNOSIS — K5900 Constipation, unspecified: Secondary | ICD-10-CM | POA: Diagnosis not present

## 2020-03-17 DIAGNOSIS — Z8249 Family history of ischemic heart disease and other diseases of the circulatory system: Secondary | ICD-10-CM

## 2020-03-17 DIAGNOSIS — R7989 Other specified abnormal findings of blood chemistry: Secondary | ICD-10-CM | POA: Diagnosis present

## 2020-03-17 DIAGNOSIS — W19XXXA Unspecified fall, initial encounter: Secondary | ICD-10-CM

## 2020-03-17 DIAGNOSIS — Z20822 Contact with and (suspected) exposure to covid-19: Secondary | ICD-10-CM | POA: Diagnosis not present

## 2020-03-17 DIAGNOSIS — Z955 Presence of coronary angioplasty implant and graft: Secondary | ICD-10-CM | POA: Diagnosis not present

## 2020-03-17 DIAGNOSIS — D72829 Elevated white blood cell count, unspecified: Secondary | ICD-10-CM | POA: Diagnosis present

## 2020-03-17 DIAGNOSIS — Z888 Allergy status to other drugs, medicaments and biological substances status: Secondary | ICD-10-CM | POA: Diagnosis not present

## 2020-03-17 DIAGNOSIS — I2510 Atherosclerotic heart disease of native coronary artery without angina pectoris: Secondary | ICD-10-CM | POA: Diagnosis not present

## 2020-03-17 DIAGNOSIS — Z7982 Long term (current) use of aspirin: Secondary | ICD-10-CM | POA: Diagnosis not present

## 2020-03-17 DIAGNOSIS — Z7189 Other specified counseling: Secondary | ICD-10-CM | POA: Insufficient documentation

## 2020-03-17 DIAGNOSIS — I252 Old myocardial infarction: Secondary | ICD-10-CM | POA: Diagnosis not present

## 2020-03-17 DIAGNOSIS — S72061A Displaced articular fracture of head of right femur, initial encounter for closed fracture: Secondary | ICD-10-CM | POA: Diagnosis not present

## 2020-03-17 DIAGNOSIS — E782 Mixed hyperlipidemia: Secondary | ICD-10-CM | POA: Diagnosis present

## 2020-03-17 DIAGNOSIS — Z419 Encounter for procedure for purposes other than remedying health state, unspecified: Secondary | ICD-10-CM

## 2020-03-17 DIAGNOSIS — Z79899 Other long term (current) drug therapy: Secondary | ICD-10-CM

## 2020-03-17 DIAGNOSIS — S72011A Unspecified intracapsular fracture of right femur, initial encounter for closed fracture: Principal | ICD-10-CM | POA: Diagnosis present

## 2020-03-17 DIAGNOSIS — Z880 Allergy status to penicillin: Secondary | ICD-10-CM

## 2020-03-17 DIAGNOSIS — I10 Essential (primary) hypertension: Secondary | ICD-10-CM | POA: Diagnosis not present

## 2020-03-17 DIAGNOSIS — Z886 Allergy status to analgesic agent status: Secondary | ICD-10-CM

## 2020-03-17 DIAGNOSIS — K219 Gastro-esophageal reflux disease without esophagitis: Secondary | ICD-10-CM | POA: Diagnosis present

## 2020-03-17 DIAGNOSIS — S72001A Fracture of unspecified part of neck of right femur, initial encounter for closed fracture: Secondary | ICD-10-CM

## 2020-03-17 DIAGNOSIS — S7291XA Unspecified fracture of right femur, initial encounter for closed fracture: Secondary | ICD-10-CM

## 2020-03-17 DIAGNOSIS — M810 Age-related osteoporosis without current pathological fracture: Secondary | ICD-10-CM | POA: Diagnosis not present

## 2020-03-17 DIAGNOSIS — M47816 Spondylosis without myelopathy or radiculopathy, lumbar region: Secondary | ICD-10-CM | POA: Diagnosis not present

## 2020-03-17 DIAGNOSIS — M25572 Pain in left ankle and joints of left foot: Secondary | ICD-10-CM | POA: Diagnosis not present

## 2020-03-17 DIAGNOSIS — W1830XA Fall on same level, unspecified, initial encounter: Secondary | ICD-10-CM | POA: Diagnosis not present

## 2020-03-17 DIAGNOSIS — S72041A Displaced fracture of base of neck of right femur, initial encounter for closed fracture: Secondary | ICD-10-CM | POA: Diagnosis not present

## 2020-03-17 DIAGNOSIS — M25551 Pain in right hip: Secondary | ICD-10-CM | POA: Diagnosis not present

## 2020-03-17 DIAGNOSIS — Y92009 Unspecified place in unspecified non-institutional (private) residence as the place of occurrence of the external cause: Secondary | ICD-10-CM | POA: Diagnosis not present

## 2020-03-17 DIAGNOSIS — N179 Acute kidney failure, unspecified: Secondary | ICD-10-CM | POA: Diagnosis present

## 2020-03-17 DIAGNOSIS — Z96641 Presence of right artificial hip joint: Secondary | ICD-10-CM | POA: Diagnosis not present

## 2020-03-17 DIAGNOSIS — Z471 Aftercare following joint replacement surgery: Secondary | ICD-10-CM | POA: Diagnosis not present

## 2020-03-17 LAB — CBC WITH DIFFERENTIAL/PLATELET
Abs Immature Granulocytes: 0.11 10*3/uL — ABNORMAL HIGH (ref 0.00–0.07)
Basophils Absolute: 0.1 10*3/uL (ref 0.0–0.1)
Basophils Relative: 1 %
Eosinophils Absolute: 0.2 10*3/uL (ref 0.0–0.5)
Eosinophils Relative: 2 %
HCT: 41 % (ref 36.0–46.0)
Hemoglobin: 13.3 g/dL (ref 12.0–15.0)
Immature Granulocytes: 1 %
Lymphocytes Relative: 17 %
Lymphs Abs: 2.3 10*3/uL (ref 0.7–4.0)
MCH: 31 pg (ref 26.0–34.0)
MCHC: 32.4 g/dL (ref 30.0–36.0)
MCV: 95.6 fL (ref 80.0–100.0)
Monocytes Absolute: 1.4 10*3/uL — ABNORMAL HIGH (ref 0.1–1.0)
Monocytes Relative: 11 %
Neutro Abs: 9.2 10*3/uL — ABNORMAL HIGH (ref 1.7–7.7)
Neutrophils Relative %: 68 %
Platelets: 311 10*3/uL (ref 150–400)
RBC: 4.29 MIL/uL (ref 3.87–5.11)
RDW: 14.2 % (ref 11.5–15.5)
WBC: 13.4 10*3/uL — ABNORMAL HIGH (ref 4.0–10.5)
nRBC: 0 % (ref 0.0–0.2)

## 2020-03-17 LAB — BASIC METABOLIC PANEL
Anion gap: 14 (ref 5–15)
BUN: 33 mg/dL — ABNORMAL HIGH (ref 8–23)
CO2: 23 mmol/L (ref 22–32)
Calcium: 9.5 mg/dL (ref 8.9–10.3)
Chloride: 103 mmol/L (ref 98–111)
Creatinine, Ser: 1.3 mg/dL — ABNORMAL HIGH (ref 0.44–1.00)
GFR, Estimated: 40 mL/min — ABNORMAL LOW (ref >60)
Glucose, Bld: 121 mg/dL — ABNORMAL HIGH (ref 70–99)
Potassium: 3.5 mmol/L (ref 3.5–5.1)
Sodium: 140 mmol/L (ref 135–145)

## 2020-03-17 LAB — SURGICAL PCR SCREEN
MRSA, PCR: NEGATIVE
Staphylococcus aureus: POSITIVE — AB

## 2020-03-17 LAB — RESP PANEL BY RT-PCR (FLU A&B, COVID) ARPGX2
Influenza A by PCR: NEGATIVE
Influenza B by PCR: NEGATIVE
SARS Coronavirus 2 by RT PCR: NEGATIVE

## 2020-03-17 LAB — PROTIME-INR
INR: 1 (ref 0.8–1.2)
Prothrombin Time: 13.1 seconds (ref 11.4–15.2)

## 2020-03-17 MED ORDER — AMLODIPINE BESYLATE 5 MG PO TABS: 2.5000 mg | ORAL_TABLET | ORAL | Status: DC

## 2020-03-17 MED ORDER — ONDANSETRON HCL 4 MG/2ML IJ SOLN
4.0000 mg | Freq: Once | INTRAMUSCULAR | Status: AC
  Administered 2020-03-17: 4 mg via INTRAVENOUS
  Filled 2020-03-17: qty 2

## 2020-03-17 MED ORDER — FENTANYL CITRATE (PF) 100 MCG/2ML IJ SOLN
50.0000 ug | Freq: Once | INTRAMUSCULAR | Status: AC
  Administered 2020-03-17: 50 ug via INTRAVENOUS
  Filled 2020-03-17: qty 2

## 2020-03-17 MED ORDER — MAGNESIUM OXIDE 400 (241.3 MG) MG PO TABS
400.0000 mg | ORAL_TABLET | Freq: Every day | ORAL | Status: DC
  Administered 2020-03-18 – 2020-03-23 (×5): 400 mg via ORAL
  Filled 2020-03-17 (×5): qty 1

## 2020-03-17 MED ORDER — MULTIVITAMINS PO CAPS: 1.0000 | ORAL_CAPSULE | Freq: Every day | ORAL | Status: DC

## 2020-03-17 MED ORDER — LUTEIN 40 MG PO CAPS: 40.0000 mg | ORAL_CAPSULE | Freq: Every day | ORAL | Status: DC

## 2020-03-17 MED ORDER — METOPROLOL TARTRATE 25 MG PO TABS
25.0000 mg | ORAL_TABLET | Freq: Two times a day (BID) | ORAL | Status: DC
  Administered 2020-03-18 – 2020-03-23 (×11): 25 mg via ORAL
  Filled 2020-03-17 (×11): qty 1

## 2020-03-17 MED ORDER — ACETAMINOPHEN 325 MG PO TABS
650.0000 mg | ORAL_TABLET | Freq: Four times a day (QID) | ORAL | Status: DC | PRN
  Administered 2020-03-20 – 2020-03-23 (×6): 650 mg via ORAL
  Filled 2020-03-17 (×6): qty 2

## 2020-03-17 MED ORDER — ZINC 25 MG PO TABS: 25.0000 mg | ORAL_TABLET | Freq: Every day | ORAL | Status: DC

## 2020-03-17 MED ORDER — ACETAMINOPHEN 650 MG RE SUPP: 650.0000 mg | Freq: Four times a day (QID) | RECTAL | Status: DC | PRN

## 2020-03-17 MED ORDER — FUROSEMIDE 20 MG PO TABS: 20.0000 mg | ORAL_TABLET | Freq: Every day | ORAL | 3 refills | Status: DC

## 2020-03-17 MED ORDER — DIPHENHYDRAMINE-APAP (SLEEP) 25-500 MG PO TABS: 1.0000 | ORAL_TABLET | Freq: Every evening | ORAL | Status: DC | PRN

## 2020-03-17 MED ORDER — METHOCARBAMOL 500 MG PO TABS
500.0000 mg | ORAL_TABLET | Freq: Once | ORAL | Status: AC
  Administered 2020-03-17: 500 mg via ORAL
  Filled 2020-03-17: qty 1

## 2020-03-17 MED ORDER — ROSUVASTATIN CALCIUM 10 MG PO TABS
10.0000 mg | ORAL_TABLET | Freq: Every day | ORAL | Status: DC
  Administered 2020-03-18 – 2020-03-23 (×5): 10 mg via ORAL
  Filled 2020-03-17 (×5): qty 1

## 2020-03-17 MED ORDER — NITROGLYCERIN 0.4 MG SL SUBL: 0.4000 mg | SUBLINGUAL_TABLET | SUBLINGUAL | Status: DC | PRN

## 2020-03-17 MED ORDER — PROMETHAZINE HCL 25 MG/ML IJ SOLN
12.5000 mg | Freq: Once | INTRAMUSCULAR | Status: AC
  Administered 2020-03-17: 12.5 mg via INTRAVENOUS
  Filled 2020-03-17: qty 1

## 2020-03-17 MED ORDER — VITAMIN D 25 MCG (1000 UNIT) PO TABS
2000.0000 [IU] | ORAL_TABLET | Freq: Every day | ORAL | Status: DC
  Administered 2020-03-18 – 2020-03-23 (×5): 2000 [IU] via ORAL
  Filled 2020-03-17 (×8): qty 2

## 2020-03-17 MED ORDER — ONDANSETRON HCL 4 MG/2ML IJ SOLN
4.0000 mg | Freq: Four times a day (QID) | INTRAMUSCULAR | Status: DC | PRN
  Administered 2020-03-17 – 2020-03-18 (×2): 4 mg via INTRAVENOUS
  Filled 2020-03-17 (×2): qty 2

## 2020-03-17 MED ORDER — FENTANYL CITRATE (PF) 100 MCG/2ML IJ SOLN
25.0000 ug | INTRAMUSCULAR | Status: DC | PRN
  Administered 2020-03-17 – 2020-03-18 (×2): 25 ug via INTRAVENOUS
  Filled 2020-03-17 (×2): qty 2

## 2020-03-17 MED ORDER — ONDANSETRON HCL 4 MG PO TABS: 4.0000 mg | ORAL_TABLET | Freq: Four times a day (QID) | ORAL | Status: DC | PRN

## 2020-03-17 MED ORDER — SODIUM CHLORIDE 0.45 % IV SOLN: INTRAVENOUS | Status: DC

## 2020-03-17 NOTE — H&P (Addendum)
History and Physical   Tracy Rogers [id number] DOB: [birth year] DOA: 03/17/2020  Referring MD/NP/PA: Dr. Roslynn Amble  PCP: Vernie Shanks, MD   Outpatient Specialists: Dr. Virgina Jock, cardiology, Dr. Onnie Graham orthopedics  Patient coming from: Home  Chief Complaint: Fall with hip injury  HPI: Tracy Rogers is a 86 y.o. female with medical history significant of coronary artery disease, hypertension, hyperlipidemia, osteopenia with osteoporosis, multiple fractures in the past, spinal stenosis and osteoarthritis who presents after sustaining mechanical fall at home. She was seen today by her cardiologist for regular visit. She has been on Brilinta and was told she can discontinue that. Patient walking to her car when she suddenly lost her step and fell. She believes her right knee gave out on her. She fell landing on her right side. Immediately felt pain rated as 9 out of 10. She denied any other symptoms. Did not feel dizzy prior to the fall did not pass out. Did not hit her head. Patient came to the ER where she was seen and evaluated. She is being admitted with right femur fracture. She was previously seen by Dr. Onnie Graham for her humeral fracture in September of this year..  ED Course: Temperature is 99.2 blood pressure 172/96 pulse 85 respirate 23 oxygen sat 90% on room air. Sodium 140 potassium 3.5 chloride 103 CO2 23 glucose 121 BUN 33 creatinine 1.30 calcium 9.5 gap of 14. White count 13.4 the rest of the CBC appears to be within normal. COVID-19 screen is negative. X-ray showed displaced fracture of the right femural neck and patient discussed with Dr. Al Corpus who will see patient for possible repair.  Review of Systems: As per HPI otherwise 10 point review of systems negative.    Past Medical History:  Diagnosis Date  . AKI (acute kidney injury) ([place]) 09/24/2019  . Arthritis   . Atherosclerosis of native coronary artery of native heart without angina pectoris 02/14/2019  .  Humerus fracture 08/2019  . Hyperlipidemia   . Hypertension   . Lumbar herniated disc   . Osteopenia   . Osteoporosis   . Sciatica   . Spinal stenosis     Past Surgical History:  Procedure Laterality Date  . CORONARY STENT INTERVENTION N/A 10/24/2018   Procedure: CORONARY STENT INTERVENTION;  Surgeon: Nigel Mormon, MD;  Location: [place] [facility];  Service: Cardiovascular;  Laterality: N/A;  . FRACTURE SURGERY Right    wrist, plate inserted  . LEFT HEART CATH AND CORONARY ANGIOGRAPHY N/A 10/24/2018   Procedure: LEFT HEART CATH AND CORONARY ANGIOGRAPHY;  Surgeon: Nigel Mormon, MD;  Location: [place] [facility];  Service: Cardiovascular;  Laterality: N/A;  . lower back surgery  2012   Hernia disk     reports that she has never smoked. She has never used smokeless tobacco. She reports current alcohol use. She reports that she does not use drugs.  Allergies  Allergen Reactions  . Penicillins     PT SPIKED FEVER  . Bactrim [Sulfamethoxazole-Trimethoprim]   . Benzodiazepines   . Hydrocodone Nausea And Vomiting  . Meloxicam   . 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  . Oxycodone Nausea And Vomiting    Family History  Problem Relation Age of Onset  . Dementia Mother   . Addison's disease Sister   . Heart disease Father   . Colon cancer Neg Hx   . Colon polyps Neg Hx   . Kidney disease Neg Hx   .  Diabetes Neg Hx   . Esophageal cancer Neg Hx   . Gallbladder disease Neg Hx      Prior to Admission medications   Medication Sig Start Date End Date Taking? Authorizing Provider  amLODipine (NORVASC) 5 MG tablet Take 2.5-5 mg by mouth See admin instructions. Takes 5mg  by mouth in the morning and 2.5 mg in the evening    [provider]  Ascorbic Acid (VITA-C PO) Take 1,000 mg by mouth daily.    [provider]  aspirin 81 MG EC tablet Take 1 tablet (81 mg total) by mouth daily. 07/17/19   Adrian Prows, MD  Bilberry 1000 MG CAPS  Take 1,000 mg by mouth in the morning and at bedtime.     [provider]  Cholecalciferol (VITAMIN D3) 50 MCG (2000 UT) capsule Take 2,000 Units by mouth daily.    [provider]  Coenzyme Q10 (COQ10) 200 MG CAPS Take 200 mg by mouth in the morning and at bedtime.    [provider]  ELDERBERRY PO Take 1 tablet by mouth in the morning and at bedtime.     [provider]  furosemide (LASIX) 20 MG tablet Take 1 tablet (20 mg total) by mouth daily. 03/17/20 06/15/20  Patwardhan, Reynold Bowen, MD  GARLIC [id number] PO Take [id number] mg by mouth in the morning and at bedtime.    [provider]  LECITHIN PO Take 1 tablet by mouth daily.     [provider]  lisinopril (ZESTRIL) 20 MG tablet Take 20 mg by mouth daily.    [provider]  Lutein 40 MG CAPS Take 40 mg by mouth daily.    [provider]  magnesium oxide (MAG-OX) 400 MG tablet Take 400 mg by mouth daily.    [provider]  metoprolol tartrate (LOPRESSOR) 25 MG tablet TAKE 1 TABLET BY MOUTH TWICE A DAY Patient taking differently: Take 25 mg by mouth 2 (two) times daily. 03/24/19   Patwardhan, Reynold Bowen, MD  Multiple Vitamin (MULTIVITAMIN) capsule Take 1 capsule by mouth daily.    [provider]  nitroGLYCERIN (NITROSTAT) 0.4 MG SL tablet Place 0.4 mg under the tongue every 5 (five) minutes as needed for chest pain.    [provider]  ondansetron (ZOFRAN) 4 MG tablet Take 1 tablet (4 mg total) by mouth every 6 (six) hours as needed for nausea. 09/26/19   Little Ishikawa, MD  pantoprazole (PROTONIX) 40 MG tablet TAKE 1 TABLET BY MOUTH EVERY DAY 09/08/19   Patwardhan, Manish J, MD  rosuvastatin (CRESTOR) 10 MG tablet TAKE 1 TABLET BY MOUTH EVERY DAY 11/20/19   Patwardhan, Reynold Bowen, MD  Zinc 25 MG TABS Take 25 mg by mouth daily.    [provider]    Physical Exam: Vitals:   03/17/20 [id number] 03/17/20 1652 03/17/20 1800 03/17/20 1805  BP:  (!)  172/96 138/74   Pulse:  85 71   Resp:  20 18   Temp:  98.2 F (36.8 C)    TempSrc:  Oral    SpO2:  98% 90% 99%  Weight: 55.3 kg     Height: 5\' 1"  (1.549 m)         Constitutional: Acutely ill looking, weak no distress Vitals:   03/17/20 1647 03/17/20 1652 03/17/20 1800 03/17/20 1805  BP:  (!) 172/96 138/74   Pulse:  85 71   Resp:  20 18   Temp:  98.2 F (36.8 C)  TempSrc:  Oral    SpO2:  98% 90% 99%  Weight: 55.3 kg     Height: 5\' 1"  (1.549 m)      Eyes: PERRL, lids and conjunctivae normal ENMT: Mucous membranes are moist. Posterior pharynx clear of any exudate or lesions.Normal dentition.  Neck: normal, supple, no masses, no thyromegaly Respiratory: clear to auscultation bilaterally, no wheezing, no crackles. Normal respiratory effort. No accessory muscle use.  Cardiovascular: Regular rate and rhythm, no murmurs / rubs / gallops. No extremity edema. 2+ pedal pulses. No carotid bruits.  Abdomen: no tenderness, no masses palpated. No hepatosplenomegaly. Bowel sounds positive.  Musculoskeletal: no clubbing / cyanosis. Right lower extremity laterally rotated and shortened, good ROM, no contractures. Normal muscle tone.  Skin: no rashes, lesions, ulcers. No induration Neurologic: CN 2-12 grossly intact. Sensation intact, DTR normal. Strength 5/5 in all 4.  Psychiatric: Normal judgment and insight. Alert and oriented x 3. Normal mood.     Labs on Admission: I have personally reviewed following labs and imaging studies  CBC: Recent Labs  Lab 03/17/20 1654  WBC 13.4*  NEUTROABS 9.2*  HGB 13.3  HCT 41.0  MCV 95.6  PLT [id number]   Basic Metabolic Panel: Recent Labs  Lab 03/17/20 1654  NA 140  K 3.5  CL 103  CO2 23  GLUCOSE 121*  BUN 33*  CREATININE 1.30*  CALCIUM 9.5   GFR: Estimated Creatinine Clearance: 23 mL/min (A) (by C-G formula based on SCr of 1.3 mg/dL (H)). Liver Function Tests: No results for input(s): AST, ALT, ALKPHOS, BILITOT, PROT, ALBUMIN in the  last 168 hours. No results for input(s): LIPASE, AMYLASE in the last 168 hours. No results for input(s): AMMONIA in the last 168 hours. Coagulation Profile: Recent Labs  Lab 03/17/20 1654  INR 1.0   Cardiac Enzymes: No results for input(s): CKTOTAL, CKMB, CKMBINDEX, TROPONINI in the last 168 hours. BNP (last 3 results) No results for input(s): PROBNP in the last 8760 hours. HbA1C: No results for input(s): HGBA1C in the last 72 hours. CBG: No results for input(s): GLUCAP in the last 168 hours. Lipid Profile: No results for input(s): CHOL, HDL, LDLCALC, TRIG, CHOLHDL, LDLDIRECT in the last 72 hours. Thyroid Function Tests: No results for input(s): TSH, T4TOTAL, FREET4, T3FREE, THYROIDAB in the last 72 hours. Anemia Panel: No results for input(s): VITAMINB12, FOLATE, FERRITIN, TIBC, IRON, RETICCTPCT in the last 72 hours. Urine analysis:    Component Value Date/Time   COLORURINE YELLOW 09/25/2019 0527   APPEARANCEUR CLEAR 09/25/2019 0527   LABSPEC 1.012 09/25/2019 0527   PHURINE 6.0 09/25/2019 0527   GLUCOSEU NEGATIVE 09/25/2019 0527   HGBUR SMALL (A) 09/25/2019 0527   BILIRUBINUR NEGATIVE 09/25/2019 0527   BILIRUBINUR neg 06/30/2014 0850   KETONESUR NEGATIVE 09/25/2019 0527   PROTEINUR NEGATIVE 09/25/2019 0527   UROBILINOGEN negative 06/30/2014 0850   UROBILINOGEN 0.2 04/16/2011 2237   NITRITE NEGATIVE 09/25/2019 0527   LEUKOCYTESUR NEGATIVE 09/25/2019 0527   Sepsis Labs: @LABRCNTIP (procalcitonin:4,lacticidven:4) ) Recent Results (from the past 240 hour(s))  Resp Panel by RT-PCR (Flu A&B, Covid) Nasopharyngeal Swab     Status: None   Collection Time: 03/17/20  5:10 PM   Specimen: Nasopharyngeal Swab; Nasopharyngeal(NP) swabs in vial transport medium  Result Value Ref Range Status   SARS Coronavirus 2 by RT PCR NEGATIVE NEGATIVE Final    Comment: (NOTE) SARS-CoV-2 target nucleic acids are NOT DETECTED.  The SARS-CoV-2 RNA is generally detectable in upper  respiratory specimens during the acute phase of infection.  The lowest concentration of SARS-CoV-2 viral copies this assay can detect is 138 copies/mL. A negative result does not preclude SARS-Cov-2 infection and should not be used as the sole basis for treatment or other patient management decisions. A negative result may occur with  improper specimen collection/handling, submission of specimen other than nasopharyngeal swab, presence of viral mutation(s) within the areas targeted by this assay, and inadequate number of viral copies(<138 copies/mL). A negative result must be combined with clinical observations, patient history, and epidemiological information. The expected result is Negative.  Fact Sheet for Patients:  EntrepreneurPulse.com.au  Fact Sheet for Healthcare Providers:  IncredibleEmployment.be  This test is no t yet approved or cleared by the [place] FDA and  has been authorized for detection and/or diagnosis of SARS-CoV-2 by FDA under an Emergency Use Authorization (EUA). This EUA will remain  in effect (meaning this test can be used) for the duration of the COVID-19 declaration under Section 564(b)(1) of the Act, 21 U.S.C.section 360bbb-3(b)(1), unless the authorization is terminated  or revoked sooner.       Influenza A by PCR NEGATIVE NEGATIVE Final   Influenza B by PCR NEGATIVE NEGATIVE Final    Comment: (NOTE) The Xpert Xpress SARS-CoV-2/FLU/RSV plus assay is intended as an aid in the diagnosis of influenza from Nasopharyngeal swab specimens and should not be used as a sole basis for treatment. Nasal washings and aspirates are unacceptable for Xpert Xpress SARS-CoV-2/FLU/RSV testing.  Fact Sheet for Patients: EntrepreneurPulse.com.au  Fact Sheet for Healthcare Providers: IncredibleEmployment.be  This test is not yet approved or cleared by the [place] FDA and has been  authorized for detection and/or diagnosis of SARS-CoV-2 by FDA under an Emergency Use Authorization (EUA). This EUA will remain in effect (meaning this test can be used) for the duration of the COVID-19 declaration under Section 564(b)(1) of the Act, 21 U.S.C. section 360bbb-3(b)(1), unless the authorization is terminated or revoked.  Performed at [facility], [place] [address], [place], [place] [address]      Radiological Exams on Admission: DG Hip Unilat  With Pelvis 2-3 Views Right  Result Date: 03/17/2020 CLINICAL DATA:  86 year old female with fall and right pain. EXAM: DG HIP (WITH OR WITHOUT PELVIS) 2-3V RIGHT COMPARISON:  CT abdomen pelvis dated 04/25/2014. FINDINGS: There is a displaced fracture of the right femoral neck. No dislocation. The bones are osteopenic. Mild bilateral hip arthritic changes. Degenerative changes of the lower lumbar spine. The soft tissues are unremarkable. IMPRESSION: Displaced fracture of the right femoral neck. No dislocation. Electronically Signed   By: Anner Crete M.D.   On: 03/17/2020 18:08    EKG: Independently reviewed. Normal sinus rhythm no significant change from previous  Assessment/Plan Principal Problem:   Femur fracture, right (HCC) Active Problems:   Essential hypertension   Mixed hyperlipidemia   Osteoporosis  Repeatedly refuses Rx meds or injectables.  See [facility] reports   CAD (coronary artery disease)   AKI (acute kidney injury) ([place])     #1 right femoral neck fracture: Patient will be admitted and initiated on pain management. Also immobilization. Orthopedics consulted. We'll keep n.p.o. after midnight in case surgery contemplated. Patient is on Brilinta. May delay surgery although she has not taken any today.  #2 essential hypertension: Continue home regimen.  #3 coronary artery disease: Patient has been doing fine. Just saw her cardiologist today. Cleared to go off her Brilinta.  #4 mixed  hyperlipidemia: Continue with statin.  #5 mild AKI: Hydrate and monitor renal  function.  #6 history of osteoporosis: Continue outpatient follow-up.   DVT prophylaxis: SCD but resume Lovenox tomorrow if no surgery. Code Status: Full code Family Communication: No family at bedside Disposition Plan: Home Consults called: Dr. Maureen Ralphs orthopedics  admission status: Inpatient  Severity of Illness: The appropriate patient status for this patient is INPATIENT. Inpatient status is judged to be reasonable and necessary in order to provide the required intensity of service to ensure the patient's safety. The patient's presenting symptoms, physical exam findings, and initial radiographic and laboratory data in the context of their chronic comorbidities is felt to place them at high risk for further clinical deterioration. Furthermore, it is not anticipated that the patient will be medically stable for discharge from the hospital within 2 midnights of admission. The following factors support the patient status of inpatient.   " The patient's presenting symptoms include right hip pain. " The worrisome physical exam findings include shortening of the right leg. " The initial radiographic and laboratory data are worrisome because of evidence of right femoral neck fracture. " The chronic co-morbidities include coronary artery disease.   * I certify that at the point of admission it is my clinical judgment that the patient will require inpatient hospital care spanning beyond 2 midnights from the point of admission due to high intensity of service, high risk for further deterioration and high frequency of surveillance required.Barbette Merino MD [facility] Pager [phone]  If 7PM-7AM, please contact night-coverage [email or web address] Password [email or web address]  03/17/2020, 7:03 PM

## 2020-03-17 NOTE — Progress Notes (Signed)
Follow up visit  Subjective:   Tracy Rogers, female    DOB: 1930-03-31, 86 y.o.   MRN: [id number]   Chief Complaint  Patient presents with  . Coronary Artery Disease    HPI  86 y.o. Caucasian female with hypertension, hyperlipidemia, CKD3, arthritis, CAD, NSTEMI 09/2018, here on 6 month follow up for CAD.   Since her last visit with me, she had a mechanical fall leading to shoulder dislocation and other injuries. She has recovered well from it. Recently, he has noticed leg edema, shortness of breath on exertion.   Current Outpatient Medications on File Prior to Visit  Medication Sig Dispense Refill  . amLODipine (NORVASC) 5 MG tablet Take 2.5-5 mg by mouth See admin instructions. Takes 71m by mouth in the morning and 2.5 mg in the evening    . Ascorbic Acid (VITAMIN C) 500 MG CAPS Take 500 mg by mouth daily.    .Marland Kitchenaspirin 81 MG EC tablet Take 1 tablet (81 mg total) by mouth daily. 90 tablet 2  . Bilberry 1000 MG CAPS Take 1,000 mg by mouth in the morning and at bedtime.     . Cholecalciferol (VITAMIN D3) 50 MCG (2000 UT) capsule Take 2,000 Units by mouth daily.    . ciprofloxacin-dexamethasone (CIPRODEX) OTIC suspension Place 4 drops into the left ear 2 (two) times daily. 7.5 mL 0  . Coenzyme Q10 (COQ10) 200 MG CAPS Take 200 mg by mouth in the morning and at bedtime.    .Marland KitchenELDERBERRY PO Take 1 tablet by mouth in the morning and at bedtime.     .Marland KitchenGARLIC 13903PO Take 10,092mg by mouth in the morning and at bedtime.    .Marland KitchenLECITHIN PO Take 1 tablet by mouth daily.     . Lutein 40 MG CAPS Take 40 mg by mouth daily.    . magnesium oxide (MAG-OX) 400 MG tablet Take 400 mg by mouth daily.    . metoprolol tartrate (LOPRESSOR) 25 MG tablet TAKE 1 TABLET BY MOUTH TWICE A DAY (Patient taking differently: Take 25 mg by mouth 2 (two) times daily. ) 180 tablet 0  . Multiple Vitamin (MULTIVITAMIN) capsule Take 1 capsule by mouth daily.    . nitroGLYCERIN (NITROSTAT) 0.4 MG SL tablet Place  0.4 mg under the tongue every 5 (five) minutes as needed for chest pain.    .Marland Kitchenondansetron (ZOFRAN) 4 MG tablet Take 1 tablet (4 mg total) by mouth every 6 (six) hours as needed for nausea. 20 tablet 0  . oxyCODONE-acetaminophen (PERCOCET/ROXICET) 5-325 MG tablet Take 1-2 tablets by mouth every 6 (six) hours as needed for severe pain. 15 tablet 0  . pantoprazole (PROTONIX) 40 MG tablet TAKE 1 TABLET BY MOUTH EVERY DAY (Patient not taking: Reported on 09/24/2019) 90 tablet 0  . rosuvastatin (CRESTOR) 10 MG tablet TAKE 1 TABLET BY MOUTH EVERY DAY 90 tablet 1  . ticagrelor (BRILINTA) 90 MG TABS tablet Take 1 tablet (90 mg total) by mouth 2 (two) times daily. 180 tablet 4  . Zinc 25 MG TABS Take 25 mg by mouth daily.     No current facility-administered medications on file prior to visit.    Cardiovascular studies:  EKG 03/17/2020: Sinus rhythm 75 bpm Left atrial enlargement Left anterior fascicular block Possible old posterior infarct  EKG 08/15/2019: Sinus rhythm 69 bpm RBB. LAFB.  Left atrial enlargement.  LVH.  Coronary angiography and intervention 10/24/2018: LM: Normal LAD: Minimal luminal irregularities Diag1 LCx: 100% prox  LCx occlusion. Mid 80% stenosis. RCA: Minimal luminal irregularities RPL  Successful percutaneous coronary intervention Prox-mid LCx PTCA and overlapping stents Synergy DES 3.0 X 28 mm &Synergy DES 2.5 X 8 mm   Echocardiogram 10/25/2018: LVEF 60-65%. Mild hypokinesis of the left ventricular, mid-apical inferolateral wall. Grade 1 DD Mild LA dilatation. Moderate MAC. Trace AI RVSP 27 mmHg  Recent labs: 09/25/2019: Glucose 123, BUN/Cr 41/1.31. EGFR 37. Na/K 135/3.6.  H/H 10/34. MCV 95. Platelets 207  05/2019: Glucose 103, BUN/Cr 28/1.28. EGFR 43. Na/K 141/4.4. Rest of the CMP normal Chol 161, TG 169, HDL 59, LDL 74  Lipid panel 08/20/2018: Chol 229,TG121, HDL 75, LDL 129.   10/25/2018:  H/H 13.7/41. MCV 94. Platelets 306  09/2013: TSH 2.552  normal   Review of Systems  Cardiovascular: Negative for chest pain, dyspnea on exertion, leg swelling, palpitations and syncope.        Vitals:   03/17/20 1333 03/17/20 1334  BP: (!) 157/75 (!) 142/79  Pulse: 75 76  SpO2: 96%      Body mass index is 23.05 kg/m. Filed Weights   03/17/20 1333  Weight: 122 lb (55.3 kg)     Objective:   Physical Exam Vitals and nursing note reviewed.  Constitutional:      General: She is not in acute distress. Neck:     Vascular: No JVD.  Cardiovascular:     Rate and Rhythm: Normal rate and regular rhythm.     Pulses: Intact distal pulses.     Heart sounds: Normal heart sounds. No murmur heard.   Pulmonary:     Effort: Pulmonary effort is normal.     Breath sounds: Normal breath sounds. No wheezing or rales.        Assessment & Recommendations:    86 y.o. Caucasian female with hypertension, hyperlipidemia, CKD3, arthritis, CAD, NSTEMI 09/2018  CAD: NSTEMI 09/2018 fromSubacute LCx occlusion treated with complex PCI (overlapping stents Synergy DES 3.0 X 28 mm &Synergy DES 2.5 X 8 mm). No recurrent anginal symptoms. Discontinue Brilinta. Continue Aspirin 81 mg. Continue metoprolol 25 mg twice daily, amlodipine 5 mg daily. Continue crestor 10 mg, with which she has had remarkable improvement in her LDL.  Hyperlipidemia: LDL at goal on Crestor 10. Will get recent lipid panel from PCP  Hypertension, leg edema: Slightly elevated today, Added lasix 20 mg daily. Will check echocardiogram.  I discussed with the patient regarding the risks of Covid infection, hospitalization, and death.  I strongly recommended getting vaccinated against Covid as a safe and FDA approved measure to reduce risk of infection, hospitalization, as well as death.   F/u in 4 weeks  [place], MD [facility] Pager: [phone] Office: [phone]

## 2020-03-17 NOTE — ED Triage Notes (Signed)
BIB EMS, fell at home going to car. Shortening and rotation of rt leg. No other injuries. 160/98-74-98% -18

## 2020-03-17 NOTE — ED Provider Notes (Signed)
[facility] DEPT Provider Note   CSN: [id number] Arrival date & time: 03/17/20  1636    History Chief Complaint  Patient presents with  . Fall  . Hip Injury    Tracy Rogers is a 86 y.o. female with medical history significant for hypertension, hyperlipidemia, sciatica who presents for evaluation after mechanical fall.  Was just returning home from cardiology where she states she had a "good" visit.  Was told she can go off her Brilinta.  States she went to walk to her car and closed a door when she fell.  Patient states she does have history of intermittent right knee giving out due to a fracture "a long time ago."  States she now has pain to her right hip.  She denies hitting her head, LOC or anticoagulation.  Was unable to ambulate due to the pain.  She rates her pain a 9/10.  States has been seen by Dr. Onnie Graham, with [facility] for a fall over the summer which required surgical management.  She denies headache, lightheadedness, dizziness, chest pain, shortness of breath, abdominal pain, dysuria, hematuria, paresthesias, weakness.  Denies additional aggravating or alleviating factors.  She has not taken anything for symptoms.  History obtained from patient and past medical records.  No interpreter used.  Ortho- [facility]. Dr. Onnie Graham- Humeral fracture, last seen 12/22/19  HPI     Past Medical History:  Diagnosis Date  . AKI (acute kidney injury) ([place]) 09/24/2019  . Arthritis   . Atherosclerosis of native coronary artery of native heart without angina pectoris 02/14/2019  . Humerus fracture 08/2019  . Hyperlipidemia   . Hypertension   . Lumbar herniated disc   . Osteopenia   . Osteoporosis   . Sciatica   . Spinal stenosis     Patient Active Problem List   Diagnosis Date Noted  . Leg edema 03/17/2020  . Educated about COVID-19 virus infection 03/17/2020  . Closed comminuted left humeral fracture 09/24/2019  . AKI (acute kidney injury)  ([place]) 09/24/2019  . Leukemoid reaction 09/24/2019  . CAD (coronary artery disease) 02/14/2019  . History of non-ST elevation myocardial infarction (NSTEMI) 02/14/2019  . NSTEMI (non-ST elevated myocardial infarction) ([place]) 10/24/2018  . Abnormal CT of the abdomen  see 03/2014 CT  CBD dilation referred to GI 05/05/2014  . Osteoporosis  Repeatedly refuses Rx meds or injectables.  See [facility] reports 12/03/2013  . Vitamin D deficiency 10/22/2013  . Essential hypertension 10/22/2013  . Mixed hyperlipidemia 10/22/2013  . Spinal stenosis of lumbar region 10/22/2013  . Sciatica 10/22/2013    Past Surgical History:  Procedure Laterality Date  . CORONARY STENT INTERVENTION N/A 10/24/2018   Procedure: CORONARY STENT INTERVENTION;  Surgeon: Nigel Mormon, MD;  Location: [facility];  Service: Cardiovascular;  Laterality: N/A;  . FRACTURE SURGERY Right    wrist, plate inserted  . LEFT HEART CATH AND CORONARY ANGIOGRAPHY N/A 10/24/2018   Procedure: LEFT HEART CATH AND CORONARY ANGIOGRAPHY;  Surgeon: Nigel Mormon, MD;  Location: [facility];  Service: Cardiovascular;  Laterality: N/A;  . lower back surgery  2012   Hernia disk     OB History   No obstetric history on file.     Family History  Problem Relation Age of Onset  . Dementia Mother   . Addison's disease Sister   . Heart disease Father   . Colon cancer Neg Hx   . Colon polyps Neg Hx   . Kidney disease  Neg Hx   . Diabetes Neg Hx   . Esophageal cancer Neg Hx   . Gallbladder disease Neg Hx     Social History   Tobacco Use  . Smoking status: Never Smoker  . Smokeless tobacco: Never Used  Vaping Use  . Vaping Use: Never used  Substance Use Topics  . Alcohol use: Yes    Alcohol/week: 0.0 - 1.0 standard drinks    Comment: rare  . Drug use: No    Home Medications Prior to Admission medications   Medication Sig Start Date End Date Taking? Authorizing Provider  amLODipine (NORVASC) 5 MG tablet Take  2.5-5 mg by mouth See admin instructions. Takes 5mg  by mouth in the morning and 2.5 mg in the evening    [provider]  Ascorbic Acid (VITA-C PO) Take 1,000 mg by mouth daily.    [provider]  aspirin 81 MG EC tablet Take 1 tablet (81 mg total) by mouth daily. 07/17/19   Adrian Prows, MD  Bilberry 1000 MG CAPS Take 1,000 mg by mouth in the morning and at bedtime.     [provider]  Cholecalciferol (VITAMIN D3) 50 MCG (2000 UT) capsule Take 2,000 Units by mouth daily.    [provider]  Coenzyme Q10 (COQ10) 200 MG CAPS Take 200 mg by mouth in the morning and at bedtime.    [provider]  ELDERBERRY PO Take 1 tablet by mouth in the morning and at bedtime.     [provider]  furosemide (LASIX) 20 MG tablet Take 1 tablet (20 mg total) by mouth daily. 03/17/20 06/15/20  Patwardhan, Reynold Bowen, MD  GARLIC [id number] PO Take [id number] mg by mouth in the morning and at bedtime.    [provider]  LECITHIN PO Take 1 tablet by mouth daily.     [provider]  lisinopril (ZESTRIL) 20 MG tablet Take 20 mg by mouth daily.    [provider]  Lutein 40 MG CAPS Take 40 mg by mouth daily.    [provider]  magnesium oxide (MAG-OX) 400 MG tablet Take 400 mg by mouth daily.    [provider]  metoprolol tartrate (LOPRESSOR) 25 MG tablet TAKE 1 TABLET BY MOUTH TWICE A DAY Patient taking differently: Take 25 mg by mouth 2 (two) times daily. 03/24/19   Patwardhan, Reynold Bowen, MD  Multiple Vitamin (MULTIVITAMIN) capsule Take 1 capsule by mouth daily.    [provider]  nitroGLYCERIN (NITROSTAT) 0.4 MG SL tablet Place 0.4 mg under the tongue every 5 (five) minutes as needed for chest pain.    [provider]  ondansetron (ZOFRAN) 4 MG tablet Take 1 tablet (4 mg total) by mouth every 6 (six) hours as needed for nausea. 09/26/19   Little Ishikawa, MD  pantoprazole (PROTONIX) 40 MG tablet TAKE 1 TABLET BY  MOUTH EVERY DAY 09/08/19   Patwardhan, Manish J, MD  rosuvastatin (CRESTOR) 10 MG tablet TAKE 1 TABLET BY MOUTH EVERY DAY 11/20/19   Patwardhan, Reynold Bowen, MD  Zinc 25 MG TABS Take 25 mg by mouth daily.    [provider]    Allergies    Penicillins, Bactrim [sulfamethoxazole-trimethoprim], Benzodiazepines, Hydrocodone, Meloxicam, Other, and Oxycodone  Review of Systems   Review of Systems  Constitutional: Negative.   HENT: Negative.   Respiratory: Negative.   Cardiovascular: Negative.   Gastrointestinal: Negative.   Genitourinary: Negative.   Musculoskeletal: Negative.  Right hip pain  Skin: Negative.   Neurological: Negative.   All other systems reviewed and are negative.   Physical Exam Updated Vital Signs BP 138/74   Pulse 71   Temp 98.2 F (36.8 C) (Oral)   Resp 18   Ht 5\' 1"  (1.549 m)   Wt 55.3 kg   SpO2 99%   BMI 23.05 kg/m   Physical Exam Vitals and nursing note reviewed.  Constitutional:      General: She is not in acute distress.    Appearance: She is well-developed and well-nourished. She is not ill-appearing, toxic-appearing or diaphoretic.  HENT:     Head: Normocephalic and atraumatic.     Nose: Nose normal.     Mouth/Throat:     Mouth: Mucous membranes are moist.  Eyes:     Pupils: Pupils are equal, round, and reactive to light.  Cardiovascular:     Rate and Rhythm: Normal rate.     Pulses: Normal pulses and intact distal pulses.          Dorsalis pedis pulses are 2+ on the right side and 2+ on the left side.       Posterior tibial pulses are 2+ on the right side and 2+ on the left side.     Heart sounds: Normal heart sounds.  Pulmonary:     Effort: Pulmonary effort is normal. No respiratory distress.     Breath sounds: Normal breath sounds.  Abdominal:     General: Bowel sounds are normal. There is no distension.  Musculoskeletal:        General: Normal range of motion.     Cervical back: Normal range of motion.     Comments:  Tenderness to right hip.  Pelvis stable.  No midline spinal tenderness.  Full range of motion bilateral upper extremities.  She has shortening or rotation of her right leg with external rotation.  No overlying skin changes.  Feet:     Right foot:     Skin integrity: Skin integrity normal.     Left foot:     Skin integrity: Skin integrity normal.  Skin:    General: Skin is warm and dry.     Capillary Refill: Capillary refill takes less than 2 seconds.     Comments: No edema, erythema or warmth.  No fluctuance or induration.  Neurological:     Mental Status: She is alert.     Comments: Cranial nerves II through XII grossly intact Unable to ambulate secondary to possible hip fracture.  Psychiatric:        Mood and Affect: Mood and affect normal.     ED Results / Procedures / Treatments   Labs (all labs ordered are listed, but only abnormal results are displayed) Labs Reviewed  CBC WITH DIFFERENTIAL/PLATELET - Abnormal; Notable for the following components:      Result Value   WBC 13.4 (*)    Neutro Abs 9.2 (*)    Monocytes Absolute 1.4 (*)    Abs Immature Granulocytes 0.11 (*)    All other components within normal limits  BASIC METABOLIC PANEL - Abnormal; Notable for the following components:   Glucose, Bld 121 (*)    BUN 33 (*)    Creatinine, Ser 1.30 (*)    GFR, Estimated 40 (*)    All other components within normal limits  RESP PANEL BY RT-PCR (FLU A&B, COVID) ARPGX2  PROTIME-INR    EKG None  Radiology DG Hip Unilat  With Pelvis  2-3 Views Right  Result Date: 03/17/2020 CLINICAL DATA:  86 year old female with fall and right pain. EXAM: DG HIP (WITH OR WITHOUT PELVIS) 2-3V RIGHT COMPARISON:  CT abdomen pelvis dated 04/25/2014. FINDINGS: There is a displaced fracture of the right femoral neck. No dislocation. The bones are osteopenic. Mild bilateral hip arthritic changes. Degenerative changes of the lower lumbar spine. The soft tissues are unremarkable. IMPRESSION:  Displaced fracture of the right femoral neck. No dislocation. Electronically Signed   By: Anner Crete M.D.   On: 03/17/2020 18:08    Procedures Procedures (including critical care time)  Medications Ordered in ED Medications  fentaNYL (SUBLIMAZE) injection 50 mcg (has no administration in time range)  fentaNYL (SUBLIMAZE) injection 50 mcg (50 mcg Intravenous Given 03/17/20 1710)  ondansetron (ZOFRAN) injection 4 mg (4 mg Intravenous Given 03/17/20 1710)   ED Course  I have reviewed the triage vital signs and the nursing notes.  Pertinent labs & imaging results that were available during my care of the patient were reviewed by me and considered in my medical decision making (see chart for details).  86 year old appears otherwise well presents for evaluation after mechanical fall which occurred just PTA.  She denies hitting her head, LOC or anticoagulation.  She has nonfocal neuro exam.  No preceding symptoms such as headache, lightness, dizziness, chest pain or paresthesias prior to fall.  She does have shortening or rotation of her right leg.  Followed by [facility].  Heart lungs clear.  Abdomen soft, nontender.  Full range of motion to upper extremities.  No midline spinal tenderness.  Able to flex and extend at bilateral knees, ankles.  No acute traumatic head injury on exam. We will plan on labs, imaging, pain control and reassess.  Clinical concern for right hip fracture.  Labs and imaging personally reviewed and interpreted:  CBC with leukocytosis at 34.7 Metabolic panel with creatinine 1.30, similar to prior, glucose 121 INR 1.0 Covid negative Plain film right hip with displaced femoral neck fracture.  Patient reassessed.  Requesting additional pain medication.  Her oxygen did drop to 90-91% on room air with pain medicine.  Was subsequently placed on 1 L via nasal cannula.  She denies chest pain, shortness of breath. Likely from her Pain medication.  CONSULT with Dr. Maureen Ralphs  with [facility].  Admit to hospitalist they will see patient tomorrow.  CONSULT with Dr. Jonelle Sidle with [facility] who will evaluate patient for admission.  Patient seen eval by attending, Dr. Roslynn Amble who agrees with above treatment, plan and disposition.  The patient appears reasonably stabilized for admission considering the current resources, flow, and capabilities available in the ED at this time, and I doubt any other [facility] requiring further screening and/or treatment in the ED prior to admission.     MDM Rules/Calculators/A&P                           Final Clinical Impression(s) / ED Diagnoses Final diagnoses:  Fall, initial encounter  Closed fracture of right hip, initial encounter [facility])    Rx / DC Orders ED Discharge Orders    None       Haydn Hutsell A, PA-C 03/17/20 1854    Lucrezia Starch, MD 03/18/20 1555

## 2020-03-18 DIAGNOSIS — N179 Acute kidney failure, unspecified: Secondary | ICD-10-CM | POA: Diagnosis not present

## 2020-03-18 DIAGNOSIS — S72001A Fracture of unspecified part of neck of right femur, initial encounter for closed fracture: Secondary | ICD-10-CM | POA: Diagnosis not present

## 2020-03-18 LAB — URINALYSIS, ROUTINE W REFLEX MICROSCOPIC
Bilirubin Urine: NEGATIVE
Glucose, UA: 50 mg/dL — AB
Hgb urine dipstick: NEGATIVE
Ketones, ur: 5 mg/dL — AB
Leukocytes,Ua: NEGATIVE
Nitrite: NEGATIVE
Protein, ur: NEGATIVE mg/dL
Specific Gravity, Urine: 1.019 (ref 1.005–1.030)
pH: 6 (ref 5.0–8.0)

## 2020-03-18 LAB — COMPREHENSIVE METABOLIC PANEL
ALT: 23 U/L (ref 0–44)
AST: 33 U/L (ref 15–41)
Albumin: 4.2 g/dL (ref 3.5–5.0)
Alkaline Phosphatase: 111 U/L (ref 38–126)
Anion gap: 12 (ref 5–15)
BUN: 24 mg/dL — ABNORMAL HIGH (ref 8–23)
CO2: 22 mmol/L (ref 22–32)
Calcium: 8.9 mg/dL (ref 8.9–10.3)
Chloride: 104 mmol/L (ref 98–111)
Creatinine, Ser: 0.96 mg/dL (ref 0.44–1.00)
GFR, Estimated: 57 mL/min — ABNORMAL LOW (ref >60)
Glucose, Bld: 133 mg/dL — ABNORMAL HIGH (ref 70–99)
Potassium: 3.6 mmol/L (ref 3.5–5.1)
Sodium: 138 mmol/L (ref 135–145)
Total Bilirubin: 1.6 mg/dL — ABNORMAL HIGH (ref 0.3–1.2)
Total Protein: 7.2 g/dL (ref 6.5–8.1)

## 2020-03-18 LAB — CBC
HCT: 36.1 % (ref 36.0–46.0)
Hemoglobin: 11.9 g/dL — ABNORMAL LOW (ref 12.0–15.0)
MCH: 31.3 pg (ref 26.0–34.0)
MCHC: 33 g/dL (ref 30.0–36.0)
MCV: 95 fL (ref 80.0–100.0)
Platelets: 254 10*3/uL (ref 150–400)
RBC: 3.8 MIL/uL — ABNORMAL LOW (ref 3.87–5.11)
RDW: 14.2 % (ref 11.5–15.5)
WBC: 13.2 10*3/uL — ABNORMAL HIGH (ref 4.0–10.5)
nRBC: 0 % (ref 0.0–0.2)

## 2020-03-18 LAB — TYPE AND SCREEN
ABO/RH(D): O POS
Antibody Screen: NEGATIVE

## 2020-03-18 LAB — ABO/RH: ABO/RH(D): O POS

## 2020-03-18 MED ORDER — DIPHENHYDRAMINE HCL 25 MG PO CAPS: 25.0000 mg | ORAL_CAPSULE | Freq: Every evening | ORAL | Status: DC | PRN

## 2020-03-18 MED ORDER — SENNOSIDES-DOCUSATE SODIUM 8.6-50 MG PO TABS
1.0000 | ORAL_TABLET | Freq: Two times a day (BID) | ORAL | Status: DC
  Administered 2020-03-18 – 2020-03-23 (×7): 1 via ORAL
  Filled 2020-03-18 (×9): qty 1

## 2020-03-18 MED ORDER — CHLORHEXIDINE GLUCONATE CLOTH 2 % EX PADS
6.0000 | MEDICATED_PAD | Freq: Every day | CUTANEOUS | Status: DC
  Administered 2020-03-18 – 2020-03-19 (×2): 6 via TOPICAL

## 2020-03-18 MED ORDER — FENTANYL CITRATE (PF) 100 MCG/2ML IJ SOLN
50.0000 ug | INTRAMUSCULAR | Status: DC | PRN
  Administered 2020-03-18 (×5): 50 ug via INTRAVENOUS
  Filled 2020-03-18 (×5): qty 2

## 2020-03-18 MED ORDER — MUPIROCIN 2 % EX OINT
1.0000 "application " | TOPICAL_OINTMENT | Freq: Two times a day (BID) | CUTANEOUS | Status: AC
  Administered 2020-03-18 – 2020-03-22 (×9): 1 via NASAL
  Filled 2020-03-18 (×2): qty 22

## 2020-03-18 MED ORDER — AMLODIPINE BESYLATE 5 MG PO TABS
5.0000 mg | ORAL_TABLET | Freq: Every day | ORAL | Status: DC
  Administered 2020-03-18 – 2020-03-23 (×5): 5 mg via ORAL
  Filled 2020-03-18 (×5): qty 1

## 2020-03-18 MED ORDER — POTASSIUM CHLORIDE CRYS ER 20 MEQ PO TBCR
40.0000 meq | EXTENDED_RELEASE_TABLET | Freq: Once | ORAL | Status: AC
  Administered 2020-03-18: 40 meq via ORAL
  Filled 2020-03-18: qty 2

## 2020-03-18 MED ORDER — ACETAMINOPHEN 500 MG PO TABS
500.0000 mg | ORAL_TABLET | Freq: Every evening | ORAL | Status: DC | PRN
  Administered 2020-03-22: 500 mg via ORAL
  Filled 2020-03-18: qty 1

## 2020-03-18 MED ORDER — ZINC SULFATE 220 (50 ZN) MG PO CAPS
220.0000 mg | ORAL_CAPSULE | Freq: Every day | ORAL | Status: DC
  Administered 2020-03-18 – 2020-03-23 (×5): 220 mg via ORAL
  Filled 2020-03-18 (×5): qty 1

## 2020-03-18 MED ORDER — SODIUM CHLORIDE 0.9 % IV SOLN: INTRAVENOUS | Status: AC

## 2020-03-18 MED ORDER — AMLODIPINE BESYLATE 5 MG PO TABS
2.5000 mg | ORAL_TABLET | Freq: Every day | ORAL | Status: DC
  Administered 2020-03-18 – 2020-03-22 (×6): 2.5 mg via ORAL
  Filled 2020-03-18 (×6): qty 1

## 2020-03-18 MED ORDER — ADULT MULTIVITAMIN W/MINERALS CH
1.0000 | ORAL_TABLET | Freq: Every day | ORAL | Status: DC
  Administered 2020-03-18 – 2020-03-23 (×5): 1 via ORAL
  Filled 2020-03-18 (×5): qty 1

## 2020-03-18 MED ORDER — ENSURE PRE-SURGERY PO LIQD
296.0000 mL | Freq: Once | ORAL | Status: AC
  Administered 2020-03-19: 296 mL via ORAL
  Filled 2020-03-18: qty 296

## 2020-03-18 MED ORDER — PROMETHAZINE HCL 25 MG/ML IJ SOLN
6.2500 mg | Freq: Four times a day (QID) | INTRAMUSCULAR | Status: DC | PRN
Start: 2020-03-18 — End: 2020-03-23

## 2020-03-18 MED ORDER — PANTOPRAZOLE SODIUM 40 MG PO TBEC
40.0000 mg | DELAYED_RELEASE_TABLET | Freq: Every day | ORAL | Status: DC
  Administered 2020-03-18 – 2020-03-23 (×5): 40 mg via ORAL
  Filled 2020-03-18 (×5): qty 1

## 2020-03-18 NOTE — Anesthesia Preprocedure Evaluation (Addendum)
Anesthesia Evaluation  Patient identified by MRN, date of birth, ID band Patient awake    Reviewed: Allergy & Precautions, NPO status , Patient's Chart, lab work & pertinent test results  Airway Mallampati: II  TM Distance: >3 FB Neck ROM: Full    Dental  (+) Dental Advisory Given   Pulmonary neg pulmonary ROS,    Pulmonary exam normal breath sounds clear to auscultation       Cardiovascular hypertension, Pt. on medications and Pt. on home beta blockers + CAD and + Past MI  Normal cardiovascular exam Rhythm:Regular Rate:Normal  Echo 2020 1. The left ventricle has normal systolic function with an ejection fraction of 60-65%. The cavity size was normal. Left ventricular diastolic Doppler parameters are consistent with impaired relaxation.  2. Mild hypokinesis of the left ventricular, mid-apical inferolateral wall.  3. Left atrial size was mildly dilated.  4. Small pericardial effusion.  5. Mild thickening of the mitral valve leaflet. Mild calcification of the mitral valve leaflet. There is moderate mitral annular calcification present.  6. The aortic valve is tricuspid. Mild thickening of the aortic valve. Mild calcification of the aortic valve. Aortic valve regurgitation is trivial by color flow Doppler.  7. The aorta is normal in size and structure.  8. The aortic root, ascending aorta and aortic arch are normal in size and structure.  9. The right ventricle has normal systolic function. The cavity was normal. There is no increase in right ventricular wall thickness. Right ventricular systolic pressure is normal with an estimated pressure of 27.6  mmHg.   Cath 09/2018 LM: Normal LAD: Minimal luminal irregularities Diag1 LCx: 100% prox LCx occlusion. Mid 80% stenosis. RCA: Minimal luminal irregularities RPL  Successful percutaneous coronary intervention Prox-mid LCx PTCA and overlapping stents Synergy DES 3.0 X 28 mm &  Synergy DES 2.5 X 8 mm     Neuro/Psych  Neuromuscular disease    GI/Hepatic negative GI ROS, Neg liver ROS,   Endo/Other  negative endocrine ROS  Renal/GU Renal disease     Musculoskeletal  (+) Arthritis ,   Abdominal   Peds  Hematology  (+) REFUSES BLOOD PRODUCTS, JEHOVAH'S WITNESS  Anesthesia Other Findings   Reproductive/Obstetrics                           Anesthesia Physical Anesthesia Plan  ASA: III  Anesthesia Plan: General   Post-op Pain Management:    Induction: Intravenous  PONV Risk Score and Plan: 4 or greater and Ondansetron, Dexamethasone and Treatment may vary due to age or medical condition  Airway Management Planned: Oral ETT  Additional Equipment: None  Intra-op Plan:   Post-operative Plan: Extubation in OR  Informed Consent: I have reviewed the patients History and Physical, chart, labs and discussed the procedure including the risks, benefits and alternatives for the proposed anesthesia with the patient or authorized representative who has indicated his/her understanding and acceptance.     Dental advisory given  Plan Discussed with: CRNA  Anesthesia Plan Comments:        Anesthesia Quick Evaluation

## 2020-03-18 NOTE — Consult Note (Signed)
Patient ID: Amaia Lavallie MRN: [id number] DOB/AGE: [birth year] 86 y.o.  Admit date: 03/17/2020  Chief Complaint: Right hip pain  Subjective: Reece Levy, 86 year old female, past medical history significant for CAD, osteoporosis, osteoarthritis, HTN, and hyperlipidemia presented to the [facility] Emergency Department following a mechanical fall at home. Patient was outside, walking towards her car when she fell due to her right knee giving way. Fell directly onto her right side, had immediate pain and was unable to bear weight. Denies hitting head or LOC. She was brought to the ED, where she was found to have a displaced right femoral neck fracture. Orthopedics were consulted for management.   Of note, patient was previously on Brilinta for anticoagulation. Saw her cardiologist yesterday for a regularly scheduled appointment, and was told she may discontinue this.   Allergies: Allergies  Allergen Reactions   Penicillins     PT SPIKED FEVER   Bactrim [Sulfamethoxazole-Trimethoprim]    Benzodiazepines    Hydrocodone Nausea And Vomiting   Meloxicam    Other     Pt is a Fish farm manager witness. No blood transfusions.   Oxycodone Nausea And Vomiting     Medications: Medications Prior to Admission  Medication Sig Dispense Refill Last Dose   amLODipine (NORVASC) 5 MG tablet Take 2.5-5 mg by mouth See admin instructions. Takes 5mg  by mouth in the morning and 2.5 mg in the evening   03/17/2020 at Unknown time   Ascorbic Acid (VITA-C PO) Take 1,000 mg by mouth daily.   03/17/2020 at Unknown time   aspirin 81 MG EC tablet Take 1 tablet (81 mg total) by mouth daily. 90 tablet 2 03/17/2020 at Unknown time   Bilberry 1000 MG CAPS Take 1,000 mg by mouth in the morning and at bedtime.    03/17/2020 at Unknown time   Cholecalciferol (VITAMIN D3) 50 MCG (2000 UT) capsule Take 2,000 Units by mouth daily.   03/17/2020 at Unknown time   Coenzyme Q10 (COQ10) 200 MG CAPS Take  200 mg by mouth in the morning and at bedtime.   03/17/2020 at Unknown time   diphenhydramine-acetaminophen (TYLENOL PM) 25-500 MG TABS tablet Take 1 tablet by mouth at bedtime as needed (sleep/pain).   Past Week at Unknown time   ELDERBERRY PO Take 1 tablet by mouth in the morning and at bedtime.    03/17/2020 at Unknown time   GARLIC 1500 PO Take 1,500 mg by mouth in the morning and at bedtime.   03/17/2020 at Unknown time   LECITHIN PO Take 1 tablet by mouth daily.    03/17/2020 at Unknown time   Lutein 40 MG CAPS Take 40 mg by mouth daily.   03/17/2020 at Unknown time   magnesium oxide (MAG-OX) 400 MG tablet Take 400 mg by mouth daily.   03/17/2020 at Unknown time   metoprolol tartrate (LOPRESSOR) 25 MG tablet TAKE 1 TABLET BY MOUTH TWICE A DAY (Patient taking differently: Take 25 mg by mouth 2 (two) times daily.) 180 tablet 0 03/17/2020 at 0700   Multiple Vitamin (MULTIVITAMIN) capsule Take 1 capsule by mouth daily.   03/17/2020 at Unknown time   nitroGLYCERIN (NITROSTAT) 0.4 MG SL tablet Place 0.4 mg under the tongue every 5 (five) minutes as needed for chest pain.   never   rosuvastatin (CRESTOR) 10 MG tablet TAKE 1 TABLET BY MOUTH EVERY DAY (Patient taking differently: Take 10 mg by mouth daily.) 90 tablet 1 03/16/2020 at Unknown time   Zinc 25 MG TABS  Take 25 mg by mouth daily.   03/17/2020 at Unknown time   furosemide (LASIX) 20 MG tablet Take 1 tablet (20 mg total) by mouth daily. 30 tablet 3    ondansetron (ZOFRAN) 4 MG tablet Take 1 tablet (4 mg total) by mouth every 6 (six) hours as needed for nausea. (Patient not taking: Reported on 03/17/2020) 20 tablet 0 Not Taking at Unknown time   pantoprazole (PROTONIX) 40 MG tablet TAKE 1 TABLET BY MOUTH EVERY DAY (Patient not taking: Reported on 03/17/2020) 90 tablet 0 Not Taking at Unknown time   ticagrelor (BRILINTA) 90 MG TABS tablet Take 90 mg by mouth 2 (two) times daily.   03/17/2020    Past Medical History: Past Medical  History:  Diagnosis Date   AKI (acute kidney injury) (HCC) 09/24/2019   Arthritis    Atherosclerosis of native coronary artery of native heart without angina pectoris 02/14/2019   Humerus fracture 08/2019   Hyperlipidemia    Hypertension    Lumbar herniated disc    Osteopenia    Osteoporosis    Sciatica    Spinal stenosis      Past Surgical History: Past Surgical History:  Procedure Laterality Date   CORONARY STENT INTERVENTION N/A 10/24/2018   Procedure: CORONARY STENT INTERVENTION;  Surgeon: Elder Negus, MD;  Location: [facility] INVASIVE CV LAB;  Service: Cardiovascular;  Laterality: N/A;   FRACTURE SURGERY Right    wrist, plate inserted   LEFT HEART CATH AND CORONARY ANGIOGRAPHY N/A 10/24/2018   Procedure: LEFT HEART CATH AND CORONARY ANGIOGRAPHY;  Surgeon: Elder Negus, MD;  Location: [facility] INVASIVE CV LAB;  Service: Cardiovascular;  Laterality: N/A;   lower back surgery  2012   Hernia disk     Family History: Family History  Problem Relation Age of Onset   Dementia Mother    Addison's disease Sister    Heart disease Father    Colon cancer Neg Hx    Colon polyps Neg Hx    Kidney disease Neg Hx    Diabetes Neg Hx    Esophageal cancer Neg Hx    Gallbladder disease Neg Hx     Social History: Social History   Tobacco Use   Smoking status: Never Smoker   Smokeless tobacco: Never Used  Substance Use Topics   Alcohol use: Yes    Alcohol/week: 0.0 - 1.0 standard drinks    Comment: rare     Review of Systems Constitutional: negative Eyes: negative Respiratory: negative Cardiovascular: negative Gastrointestinal: negative Musculoskeletal:positive for arthralgias Neurological: negative  Physical Exam:  General: alert and no distress HENT:Head: Normocephalic, no lesions, without obvious abnormality. Neck:supple Chest:clear to auscultation, no wheezes, rales or rhonchi, symmetric air entry Heart:S1, S2 normal, no murmur, rub or  gallop, regular rate and rhythm Abdomen:abdomen soft and non-tender  Musculoskeletal: Right lower extremity shortened and externally rotated. No open lesions. Distal pulses and sensation intact. Plantar flexion/dorsiflexion intact. Able to wiggle toes.  LABS: Results for orders placed or performed during the hospital encounter of 03/17/20  Resp Panel by RT-PCR (Flu A&B, Covid) Nasopharyngeal Swab   Specimen: Nasopharyngeal Swab; Nasopharyngeal(NP) swabs in vial transport medium  Result Value Ref Range   SARS Coronavirus 2 by RT PCR NEGATIVE NEGATIVE   Influenza A by PCR NEGATIVE NEGATIVE   Influenza B by PCR NEGATIVE NEGATIVE  Surgical PCR screen   Specimen: Nasal Mucosa; Nasal Swab  Result Value Ref Range   MRSA, PCR NEGATIVE NEGATIVE   Staphylococcus aureus POSITIVE (A) NEGATIVE  CBC with Differential  Result Value Ref Range   WBC 13.4 (H) 4.0 - 10.5 K/uL   RBC 4.29 3.87 - 5.11 MIL/uL   Hemoglobin 13.3 12.0 - 15.0 g/dL   HCT 41.0 36.0 - 46.0 %   MCV 95.6 80.0 - 100.0 fL   MCH 31.0 26.0 - 34.0 pg   MCHC 32.4 30.0 - 36.0 g/dL   RDW 14.2 11.5 - 15.5 %   Platelets 311 150 - 400 K/uL   nRBC 0.0 0.0 - 0.2 %   Neutrophils Relative % 68 %   Neutro Abs 9.2 (H) 1.7 - 7.7 K/uL   Lymphocytes Relative 17 %   Lymphs Abs 2.3 0.7 - 4.0 K/uL   Monocytes Relative 11 %   Monocytes Absolute 1.4 (H) 0.1 - 1.0 K/uL   Eosinophils Relative 2 %   Eosinophils Absolute 0.2 0.0 - 0.5 K/uL   Basophils Relative 1 %   Basophils Absolute 0.1 0.0 - 0.1 K/uL   Immature Granulocytes 1 %   Abs Immature Granulocytes 0.11 (H) 0.00 - 0.07 K/uL  Basic metabolic panel  Result Value Ref Range   Sodium 140 135 - 145 mmol/L   Potassium 3.5 3.5 - 5.1 mmol/L   Chloride 103 98 - 111 mmol/L   CO2 23 22 - 32 mmol/L   Glucose, Bld 121 (H) 70 - 99 mg/dL   BUN 33 (H) 8 - 23 mg/dL   Creatinine, Ser 1.30 (H) 0.44 - 1.00 mg/dL   Calcium 9.5 8.9 - 10.3 mg/dL   GFR, Estimated 40 (L) >60 mL/min   Anion gap 14 5 -  15  Protime-INR  Result Value Ref Range   Prothrombin Time 13.1 11.4 - 15.2 seconds   INR 1.0 0.8 - 1.2  CBC  Result Value Ref Range   WBC 13.2 (H) 4.0 - 10.5 K/uL   RBC 3.80 (L) 3.87 - 5.11 MIL/uL   Hemoglobin 11.9 (L) 12.0 - 15.0 g/dL   HCT 36.1 36.0 - 46.0 %   MCV 95.0 80.0 - 100.0 fL   MCH 31.3 26.0 - 34.0 pg   MCHC 33.0 30.0 - 36.0 g/dL   RDW 14.2 11.5 - 15.5 %   Platelets 254 150 - 400 K/uL   nRBC 0.0 0.0 - 0.2 %  Comprehensive metabolic panel  Result Value Ref Range   Sodium 138 135 - 145 mmol/L   Potassium 3.6 3.5 - 5.1 mmol/L   Chloride 104 98 - 111 mmol/L   CO2 22 22 - 32 mmol/L   Glucose, Bld 133 (H) 70 - 99 mg/dL   BUN 24 (H) 8 - 23 mg/dL   Creatinine, Ser 0.96 0.44 - 1.00 mg/dL   Calcium 8.9 8.9 - 10.3 mg/dL   Total Protein 7.2 6.5 - 8.1 g/dL   Albumin 4.2 3.5 - 5.0 g/dL   AST 33 15 - 41 U/L   ALT 23 0 - 44 U/L   Alkaline Phosphatase 111 38 - 126 U/L   Total Bilirubin 1.6 (H) 0.3 - 1.2 mg/dL   GFR, Estimated 57 (L) >60 mL/min   Anion gap 12 5 - 15   Recent Labs    03/17/20 1654 03/18/20 0320  HGB 13.3 11.9*   Recent Labs    03/17/20 1654 03/18/20 0320  WBC 13.4* 13.2*  RBC 4.29 3.80*  HCT 41.0 36.1  PLT 311 254   Recent Labs    03/17/20 1654 03/18/20 0320  NA 140 138  K 3.5 3.6  CL  103 104  CO2 23 22  BUN 33* 24*  CREATININE 1.30* 0.96  GLUCOSE 121* 133*  CALCIUM 9.5 8.9   Recent Labs    03/17/20 1654  INR 1.0    Assessment/Plan: Displaced right femoral neck fracture  Patient is being admitted to the hospital for operative management. Dr. Wynelle Link has discussed the case with Rod Can, MD who will be taking over care. Due to the displacement of the femoral neck, she will require a total hip arthroplasty. Risks and benefits of the procedure were discussed with the patient and she elects to proceed. Dr. Lyla Glassing planning to operate either today or tomorrow.   Theresa Duty, PA-C Orthopedic Surgery [facility]

## 2020-03-18 NOTE — Progress Notes (Signed)
PROGRESS NOTE    Tracy Rogers  KDX:[id number] DOB: January 30, 1931 DOA: 03/17/2020 PCP: Ileana Ladd, MD    Chief Complaint  Patient presents with  . Fall  . Hip Injury    Brief Narrative:  Tracy Rogers is a 86 y.o. female with medical history significant of coronary artery disease, hypertension, hyperlipidemia, osteopenia with osteoporosis, multiple fractures in the past, spinal stenosis and osteoarthritis who presents after sustaining mechanical fall at home. She was seen today by her cardiologist for regular visit. She has been on Brilinta and was told she can discontinue that. Patient walking to her car when she suddenly lost her step and fell. She believes her right knee gave out on her. She fell landing on her right side. Immediately felt pain rated as 9 out of 10. She denied any other symptoms. Did not feel dizzy prior to the fall did not pass out. Did not hit her head. Patient came to the ER where she was seen and evaluated. She is being admitted with right femur fracture. She was previously seen by Dr. Rennis Chris for her humeral fracture in September of this year..  Subjective:  She is in pain but report Pain medication makes her nauseous Report acid reflux  Assessment & Plan:   Principal Problem:   Femur fracture, right (HCC) Active Problems:   Essential hypertension   Mixed hyperlipidemia   Osteoporosis  Repeatedly refuses Rx meds or injectables.  See [facility] reports   CAD (coronary artery disease)   AKI (acute kidney injury) (HCC)   Displaced right femoral neck fracture with history of osteoporosis -Ortho consulted, plan total right hip arthroplasty , will follow Ortho recommendation -Home medication Brilinta held  Leukocytosis Stress versus dehydration, will check UA/urine culture  AKI/azotemia BUN 33 creatinine 1.3 on presentation, after hydration BUN 24 creatinine 0.96 this morning UA and urine culture pending collection Hold Lasix Continue  hydration Monitor volume status  CAD -Stable, just saw her cardiologist yesterday, she is cleared to go off her Brilinta Brilinta discontinued  Hyperlipidemia continue statin  Hypertension, on Norvasc and metoprolol at home, hold Lasix for now   DVT prophylaxis: SCDs Start: 03/17/20 2046   Code Status: Full Family Communication: Declined my offer to update her niece, She states surgeon will talk to her niece today Disposition:   Status is: Inpatient  Dispo: The patient is from: Home              Anticipated d/c is to: To be determined              Anticipated d/c date is: 48 hours                Consultants:   Orthopedic  Procedures:   None  Antimicrobials:   None     Objective: Vitals:   03/17/20 2000 03/17/20 2045 03/18/20 0015 03/18/20 0636  BP: 140/70 (!) 142/74 (!) 150/73 (!) 168/88  Pulse: 79 74 88 84  Resp: (!) 23 16 16 16   Temp:  99.2 F (37.3 C) 98.8 F (37.1 C) 98.9 F (37.2 C)  TempSrc:  Oral Oral   SpO2: 100% 97% 95% 99%  Weight:      Height:        Intake/Output Summary (Last 24 hours) at 03/18/2020 0900 Last data filed at 03/18/2020 0807 Gross per 24 hour  Intake 1042.36 ml  Output 300 ml  Net 742.36 ml   Filed Weights   03/17/20 1647  Weight: 55.3 kg    Examination:  General exam: Anxious, frail elderly, does not appear comfortable Respiratory system: Clear to auscultation. Respiratory effort normal. Cardiovascular system: S1 & S2 heard, RRR. No JVD, no murmur, No pedal edema. Gastrointestinal system: Abdomen is nondistended, soft and nontender. Normal bowel sounds heard. Central nervous system: Alert and oriented. No focal neurological deficits. Extremities: Right leg externally rotated and shortened Skin: No rashes, lesions or ulcers Psychiatry: Anxious    Data Reviewed: I have personally reviewed following labs and imaging studies  CBC: Recent Labs  Lab 03/17/20 1654 03/18/20 0320  WBC 13.4* 13.2*  NEUTROABS  9.2*  --   HGB 13.3 11.9*  HCT 41.0 36.1  MCV 95.6 95.0  PLT 311 846    Basic Metabolic Panel: Recent Labs  Lab 03/17/20 1654 03/18/20 0320  NA 140 138  K 3.5 3.6  CL 103 104  CO2 23 22  GLUCOSE 121* 133*  BUN 33* 24*  CREATININE 1.30* 0.96  CALCIUM 9.5 8.9    GFR: Estimated Creatinine Clearance: 31.2 mL/min (by C-G formula based on SCr of 0.96 mg/dL).  Liver Function Tests: Recent Labs  Lab 03/18/20 0320  AST 33  ALT 23  ALKPHOS 111  BILITOT 1.6*  PROT 7.2  ALBUMIN 4.2    CBG: No results for input(s): GLUCAP in the last 168 hours.   Recent Results (from the past 240 hour(s))  Resp Panel by RT-PCR (Flu A&B, Covid) Nasopharyngeal Swab     Status: None   Collection Time: 03/17/20  5:10 PM   Specimen: Nasopharyngeal Swab; Nasopharyngeal(NP) swabs in vial transport medium  Result Value Ref Range Status   SARS Coronavirus 2 by RT PCR NEGATIVE NEGATIVE Final    Comment: (NOTE) SARS-CoV-2 target nucleic acids are NOT DETECTED.  The SARS-CoV-2 RNA is generally detectable in upper respiratory specimens during the acute phase of infection. The lowest concentration of SARS-CoV-2 viral copies this assay can detect is 138 copies/mL. A negative result does not preclude SARS-Cov-2 infection and should not be used as the sole basis for treatment or other patient management decisions. A negative result may occur with  improper specimen collection/handling, submission of specimen other than nasopharyngeal swab, presence of viral mutation(s) within the areas targeted by this assay, and inadequate number of viral copies(<138 copies/mL). A negative result must be combined with clinical observations, patient history, and epidemiological information. The expected result is Negative.  Fact Sheet for Patients:  EntrepreneurPulse.com.au  Fact Sheet for Healthcare Providers:  IncredibleEmployment.be  This test is no t yet approved or  cleared by the [place] FDA and  has been authorized for detection and/or diagnosis of SARS-CoV-2 by FDA under an Emergency Use Authorization (EUA). This EUA will remain  in effect (meaning this test can be used) for the duration of the COVID-19 declaration under Section 564(b)(1) of the Act, 21 U.S.C.section 360bbb-3(b)(1), unless the authorization is terminated  or revoked sooner.       Influenza A by PCR NEGATIVE NEGATIVE Final   Influenza B by PCR NEGATIVE NEGATIVE Final    Comment: (NOTE) The Xpert Xpress SARS-CoV-2/FLU/RSV plus assay is intended as an aid in the diagnosis of influenza from Nasopharyngeal swab specimens and should not be used as a sole basis for treatment. Nasal washings and aspirates are unacceptable for Xpert Xpress SARS-CoV-2/FLU/RSV testing.  Fact Sheet for Patients: EntrepreneurPulse.com.au  Fact Sheet for Healthcare Providers: IncredibleEmployment.be  This test is not yet approved or cleared by the [place] FDA and has been authorized for detection and/or diagnosis of  SARS-CoV-2 by FDA under an Emergency Use Authorization (EUA). This EUA will remain in effect (meaning this test can be used) for the duration of the COVID-19 declaration under Section 564(b)(1) of the Act, 21 U.S.C. section 360bbb-3(b)(1), unless the authorization is terminated or revoked.  Performed at [facility], [place] [address] [place], [place] [address]   Surgical PCR screen     Status: Abnormal   Collection Time: 03/17/20  9:01 PM   Specimen: Nasal Mucosa; Nasal Swab  Result Value Ref Range Status   MRSA, PCR NEGATIVE NEGATIVE Final   Staphylococcus aureus POSITIVE (A) NEGATIVE Final    Comment: (NOTE) The Xpert SA Assay (FDA approved for NASAL specimens in patients 23 years of age and older), is one component of a comprehensive surveillance program. It is not intended to diagnose infection nor to guide or  monitor treatment. Performed at [facility], [place] [address] [place], [place] [address]          Radiology Studies: DG Hip Unilat  With Pelvis 2-3 Views Right  Result Date: 03/17/2020 CLINICAL DATA:  86 year old female with fall and right pain. EXAM: DG HIP (WITH OR WITHOUT PELVIS) 2-3V RIGHT COMPARISON:  CT abdomen pelvis dated 04/25/2014. FINDINGS: There is a displaced fracture of the right femoral neck. No dislocation. The bones are osteopenic. Mild bilateral hip arthritic changes. Degenerative changes of the lower lumbar spine. The soft tissues are unremarkable. IMPRESSION: Displaced fracture of the right femoral neck. No dislocation. Electronically Signed   By: Anner Crete M.D.   On: 03/17/2020 18:08        Scheduled Meds: . amLODipine  5 mg Oral Daily   And  . amLODipine  2.5 mg Oral QHS  . Chlorhexidine Gluconate Cloth  6 each Topical Q0600  . cholecalciferol  2,000 Units Oral Daily  . magnesium oxide  400 mg Oral Daily  . metoprolol tartrate  25 mg Oral BID  . multivitamin with minerals  1 tablet Oral Daily  . mupirocin ointment  1 application Nasal BID  . potassium chloride  40 mEq Oral Once  . rosuvastatin  10 mg Oral Daily  . zinc sulfate  220 mg Oral Daily   Continuous Infusions: . sodium chloride       LOS: 1 day   Time spent: 90mins Greater than 50% of this time was spent in counseling, explanation of diagnosis, planning of further management, and coordination of care.  I have personally reviewed and interpreted on  03/18/2020 daily labs,I reviewed all nursing notes, pharmacy notes, consultant notes,  vitals, pertinent old records  I have discussed plan of care as described above with RN , patient  on 03/18/2020  Voice Recognition /Dragon dictation system was used to create this note, attempts have been made to correct errors. Please contact the author with questions and/or clarifications.   Florencia Reasons, MD PhD FACP [facility]  Available via Epic secure chat 7am-7pm for nonurgent issues Please page for urgent issues To page the attending provider between 7A-7P or the covering provider during after hours 7P-7A, please log into the web site [email or web address] and access using universal [place] password for that web site. If you do not have the password, please call the hospital operator.    03/18/2020, 9:00 AM

## 2020-03-18 NOTE — H&P (View-Only) (Signed)
° ° °Patient ID: °Tracy Rogers °MRN: [id number] °DOB/AGE: 07/09/1930 86 y.o. ° °Admit date: 03/17/2020 ° °Chief Complaint: °Right hip pain ° °Subjective: °Tracy Rogers, 86 year old female, past medical history significant for CAD, osteoporosis, osteoarthritis, HTN, and hyperlipidemia presented to the [facility] following a mechanical fall at home. Patient was outside, walking towards her car when she fell due to her right knee giving way. Fell directly onto her right side, had immediate pain and was unable to bear weight. Denies hitting head or LOC. She was brought to the ED, where she was found to have a displaced right femoral neck fracture. Orthopedics were consulted for management.  ° °Of note, patient was previously on Brilinta for anticoagulation. Saw her cardiologist yesterday for a regularly scheduled appointment, and was told she may discontinue this.  ° °Allergies: °Allergies  °Allergen Reactions  °• Penicillins   °  PT SPIKED FEVER  °• Bactrim [Sulfamethoxazole-Trimethoprim]   °• Benzodiazepines   °• Hydrocodone Nausea And Vomiting  °• Meloxicam   °• Other   °  Pt is a jehovah witness. No blood transfusions.  °• Oxycodone Nausea And Vomiting  °  ° °Medications: °Medications Prior to Admission  °Medication Sig Dispense Refill Last Dose  °• amLODipine (NORVASC) 5 MG tablet Take 2.5-5 mg by mouth See admin instructions. Takes 5mg by mouth in the morning and 2.5 mg in the evening   03/17/2020 at Unknown time  °• Ascorbic Acid (VITA-C PO) Take 1,000 mg by mouth daily.   03/17/2020 at Unknown time  °• aspirin 81 MG EC tablet Take 1 tablet (81 mg total) by mouth daily. 90 tablet 2 03/17/2020 at Unknown time  °• Bilberry 1000 MG CAPS Take 1,000 mg by mouth in the morning and at bedtime.    03/17/2020 at Unknown time  °• Cholecalciferol (VITAMIN D3) 50 MCG (2000 UT) capsule Take 2,000 Units by mouth daily.   03/17/2020 at Unknown time  °• Coenzyme Q10 (COQ10) 200 MG CAPS Take  200 mg by mouth in the morning and at bedtime.   03/17/2020 at Unknown time  °• diphenhydramine-acetaminophen (TYLENOL PM) 25-500 MG TABS tablet Take 1 tablet by mouth at bedtime as needed (sleep/pain).   Past Week at Unknown time  °• ELDERBERRY PO Take 1 tablet by mouth in the morning and at bedtime.    03/17/2020 at Unknown time  °• GARLIC 1500 PO Take 1,500 mg by mouth in the morning and at bedtime.   03/17/2020 at Unknown time  °• LECITHIN PO Take 1 tablet by mouth daily.    03/17/2020 at Unknown time  °• Lutein 40 MG CAPS Take 40 mg by mouth daily.   03/17/2020 at Unknown time  °• magnesium oxide (MAG-OX) 400 MG tablet Take 400 mg by mouth daily.   03/17/2020 at Unknown time  °• metoprolol tartrate (LOPRESSOR) 25 MG tablet TAKE 1 TABLET BY MOUTH TWICE A DAY (Patient taking differently: Take 25 mg by mouth 2 (two) times daily.) 180 tablet 0 03/17/2020 at 0700  °• Multiple Vitamin (MULTIVITAMIN) capsule Take 1 capsule by mouth daily.   03/17/2020 at Unknown time  °• nitroGLYCERIN (NITROSTAT) 0.4 MG SL tablet Place 0.4 mg under the tongue every 5 (five) minutes as needed for chest pain.   never  °• rosuvastatin (CRESTOR) 10 MG tablet TAKE 1 TABLET BY MOUTH EVERY DAY (Patient taking differently: Take 10 mg by mouth daily.) 90 tablet 1 03/16/2020 at Unknown time  °• Zinc 25 MG TABS   Take 25 mg by mouth daily.   03/17/2020 at Unknown time  °• furosemide (LASIX) 20 MG tablet Take 1 tablet (20 mg total) by mouth daily. 30 tablet 3   °• ondansetron (ZOFRAN) 4 MG tablet Take 1 tablet (4 mg total) by mouth every 6 (six) hours as needed for nausea. (Patient not taking: Reported on 03/17/2020) 20 tablet 0 Not Taking at Unknown time  °• pantoprazole (PROTONIX) 40 MG tablet TAKE 1 TABLET BY MOUTH EVERY DAY (Patient not taking: Reported on 03/17/2020) 90 tablet 0 Not Taking at Unknown time  °• ticagrelor (BRILINTA) 90 MG TABS tablet Take 90 mg by mouth 2 (two) times daily.   03/17/2020  ° ° °Past Medical History: °Past Medical  History:  °Diagnosis Date  °• AKI (acute kidney injury) (HCC) 09/24/2019  °• Arthritis   °• Atherosclerosis of native coronary artery of native heart without angina pectoris 02/14/2019  °• Humerus fracture 08/2019  °• Hyperlipidemia   °• Hypertension   °• Lumbar herniated disc   °• Osteopenia   °• Osteoporosis   °• Sciatica   °• Spinal stenosis   °  ° °Past Surgical History: °Past Surgical History:  °Procedure Laterality Date  °• CORONARY STENT INTERVENTION N/A 10/24/2018  ° Procedure: CORONARY STENT INTERVENTION;  Surgeon: Patwardhan, Manish J, MD;  Location: [facility];  Service: Cardiovascular;  Laterality: N/A;  °• FRACTURE SURGERY Right   ° wrist, plate inserted  °• LEFT HEART CATH AND CORONARY ANGIOGRAPHY N/A 10/24/2018  ° Procedure: LEFT HEART CATH AND CORONARY ANGIOGRAPHY;  Surgeon: Patwardhan, Manish J, MD;  Location: [facility];  Service: Cardiovascular;  Laterality: N/A;  °• lower back surgery  2012  ° Hernia disk  °  ° °Family History: °Family History  °Problem Relation Age of Onset  °• Dementia Mother   °• Addison's disease Sister   °• Heart disease Father   °• Colon cancer Neg Hx   °• Colon polyps Neg Hx   °• Kidney disease Neg Hx   °• Diabetes Neg Hx   °• Esophageal cancer Neg Hx   °• Gallbladder disease Neg Hx   ° ° °Social History: °Social History  ° °Tobacco Use  °• Smoking status: Never Smoker  °• Smokeless tobacco: Never Used  °Substance Use Topics  °• Alcohol use: Yes  °  Alcohol/week: 0.0 - 1.0 standard drinks  °  Comment: rare  °  ° °Review of Systems °Constitutional: negative °Eyes: negative °Respiratory: negative °Cardiovascular: negative °Gastrointestinal: negative °Musculoskeletal:positive for arthralgias °Neurological: negative ° °Physical Exam: ° °General: alert and no distress °HENT:Head: Normocephalic, no lesions, without obvious abnormality. °Neck:supple °Chest:clear to auscultation, no wheezes, rales or rhonchi, symmetric air entry °Heart:S1, S2 normal, no murmur, rub or  gallop, regular rate and rhythm °Abdomen:abdomen soft and non-tender ° °Musculoskeletal: °Right lower extremity shortened and externally rotated. °No open lesions. °Distal pulses and sensation intact. °Plantar flexion/dorsiflexion intact. °Able to wiggle toes. ° °LABS: °Results for orders placed or performed during the hospital encounter of 03/17/20  °Resp Panel by RT-PCR (Flu A&B, Covid) Nasopharyngeal Swab  ° Specimen: Nasopharyngeal Swab; Nasopharyngeal(NP) swabs in vial transport medium  °Result Value Ref Range  ° SARS Coronavirus 2 by RT PCR NEGATIVE NEGATIVE  ° Influenza A by PCR NEGATIVE NEGATIVE  ° Influenza B by PCR NEGATIVE NEGATIVE  °Surgical PCR screen  ° Specimen: Nasal Mucosa; Nasal Swab  °Result Value Ref Range  ° MRSA, PCR NEGATIVE NEGATIVE  ° Staphylococcus aureus POSITIVE (A) NEGATIVE  °  CBC with Differential  °Result Value Ref Range  ° WBC 13.4 (H) 4.0 - 10.5 K/uL  ° RBC 4.29 3.87 - 5.11 MIL/uL  ° Hemoglobin 13.3 12.0 - 15.0 g/dL  ° HCT 41.0 36.0 - 46.0 %  ° MCV 95.6 80.0 - 100.0 fL  ° MCH 31.0 26.0 - 34.0 pg  ° MCHC 32.4 30.0 - 36.0 g/dL  ° RDW 14.2 11.5 - 15.5 %  ° Platelets 311 150 - 400 K/uL  ° nRBC 0.0 0.0 - 0.2 %  ° Neutrophils Relative % 68 %  ° Neutro Abs 9.2 (H) 1.7 - 7.7 K/uL  ° Lymphocytes Relative 17 %  ° Lymphs Abs 2.3 0.7 - 4.0 K/uL  ° Monocytes Relative 11 %  ° Monocytes Absolute 1.4 (H) 0.1 - 1.0 K/uL  ° Eosinophils Relative 2 %  ° Eosinophils Absolute 0.2 0.0 - 0.5 K/uL  ° Basophils Relative 1 %  ° Basophils Absolute 0.1 0.0 - 0.1 K/uL  ° Immature Granulocytes 1 %  ° Abs Immature Granulocytes 0.11 (H) 0.00 - 0.07 K/uL  °Basic metabolic panel  °Result Value Ref Range  ° Sodium 140 135 - 145 mmol/L  ° Potassium 3.5 3.5 - 5.1 mmol/L  ° Chloride 103 98 - 111 mmol/L  ° CO2 23 22 - 32 mmol/L  ° Glucose, Bld 121 (H) 70 - 99 mg/dL  ° BUN 33 (H) 8 - 23 mg/dL  ° Creatinine, Ser 1.30 (H) 0.44 - 1.00 mg/dL  ° Calcium 9.5 8.9 - 10.3 mg/dL  ° GFR, Estimated 40 (L) >60 mL/min  ° Anion gap 14 5 -  15  °Protime-INR  °Result Value Ref Range  ° Prothrombin Time 13.1 11.4 - 15.2 seconds  ° INR 1.0 0.8 - 1.2  °CBC  °Result Value Ref Range  ° WBC 13.2 (H) 4.0 - 10.5 K/uL  ° RBC 3.80 (L) 3.87 - 5.11 MIL/uL  ° Hemoglobin 11.9 (L) 12.0 - 15.0 g/dL  ° HCT 36.1 36.0 - 46.0 %  ° MCV 95.0 80.0 - 100.0 fL  ° MCH 31.3 26.0 - 34.0 pg  ° MCHC 33.0 30.0 - 36.0 g/dL  ° RDW 14.2 11.5 - 15.5 %  ° Platelets 254 150 - 400 K/uL  ° nRBC 0.0 0.0 - 0.2 %  °Comprehensive metabolic panel  °Result Value Ref Range  ° Sodium 138 135 - 145 mmol/L  ° Potassium 3.6 3.5 - 5.1 mmol/L  ° Chloride 104 98 - 111 mmol/L  ° CO2 22 22 - 32 mmol/L  ° Glucose, Bld 133 (H) 70 - 99 mg/dL  ° BUN 24 (H) 8 - 23 mg/dL  ° Creatinine, Ser 0.96 0.44 - 1.00 mg/dL  ° Calcium 8.9 8.9 - 10.3 mg/dL  ° Total Protein 7.2 6.5 - 8.1 g/dL  ° Albumin 4.2 3.5 - 5.0 g/dL  ° AST 33 15 - 41 U/L  ° ALT 23 0 - 44 U/L  ° Alkaline Phosphatase 111 38 - 126 U/L  ° Total Bilirubin 1.6 (H) 0.3 - 1.2 mg/dL  ° GFR, Estimated 57 (L) >60 mL/min  ° Anion gap 12 5 - 15  ° °Recent Labs  °  03/17/20 °1654 03/18/20 °0320  °HGB 13.3 11.9*  ° °Recent Labs  °  03/17/20 °1654 03/18/20 °0320  °WBC 13.4* 13.2*  °RBC 4.29 3.80*  °HCT 41.0 36.1  °PLT 311 254  ° °Recent Labs  °  03/17/20 °1654 03/18/20 °0320  °NA 140 138  °K 3.5 3.6  °CL   103 104  °CO2 23 22  °BUN 33* 24*  °CREATININE 1.30* 0.96  °GLUCOSE 121* 133*  °CALCIUM 9.5 8.9  ° °Recent Labs  °  03/17/20 °1654  °INR 1.0  ° ° °Assessment/Plan: °Displaced right femoral neck fracture ° °Patient is being admitted to the hospital for operative management. Dr. Aluisio has discussed the case with Brian Swinteck, MD who will be taking over care. Due to the displacement of the femoral neck, she will require a total hip arthroplasty. Risks and benefits of the procedure were discussed with the patient and she elects to proceed. Dr. Swinteck planning to operate either today or tomorrow.  ° °Tracy Elahi, PA-C °Orthopedic Surgery °[facility] ° ° ° °

## 2020-03-18 NOTE — TOC Initial Note (Signed)
Transition of Care [facility] - Initial/Assessment Note    Patient Details  Name: Timberlee Roblero MRN: [id number] Date of Birth: December 28, 1930  Transition of [facility]) CM/SW Contact:    Lennart Pall, LCSW Phone Number: 03/18/2020, 10:09 AM  Clinical Narrative:      Met briefly with pt this morning.  She is familiar to me from her prior hospitalization.  She now returns with another fall and hip fx with planned surgery tomorrow.  Discussed with pt that TOC will follow along for dc needs.  Of note, last admission she was able to dc home with [facility] services and private duty caregiver.  She is hopeful she will be able to dc home this time.  Discussed need for PT to evaluate after surgery and will discuss further based on their recommendations.  Continue to follow.             Expected Discharge Plan: [place] (vs SNF) Barriers to Discharge: Continued Medical Work up   Patient Goals and CMS Choice Patient states their goals for this hospitalization and ongoing recovery are:: pt hopes to be able to return home      Expected Discharge Plan and Services Expected Discharge Plan: [place] (vs SNF)       Living arrangements for the past 2 months: Single Family Home                                      Prior Living Arrangements/Services Living arrangements for the past 2 months: Single Family Home Lives with:: Self Patient language and need for interpreter reviewed:: Yes Do you feel safe going back to the place where you live?: Yes      Need for Family Participation in Patient Care:  (possibly)     Criminal Activity/Legal Involvement Pertinent to Current Situation/Hospitalization: No - Comment as needed  Activities of Daily Living Home Assistive Devices/Equipment: Eyeglasses,Shower chair with back,Cane (specify quad or straight) (reading glasses) ADL Screening (condition at time of admission) Patient's cognitive ability adequate to safely  complete daily activities?: Yes Is the patient deaf or have difficulty hearing?: No Does the patient have difficulty seeing, even when wearing glasses/contacts?: No Does the patient have difficulty concentrating, remembering, or making decisions?: No Patient able to express need for assistance with ADLs?: Yes Does the patient have difficulty dressing or bathing?: Yes Independently performs ADLs?: No Communication: Independent Dressing (OT): Needs assistance Is this a change from baseline?: Change from baseline, expected to last >3 days Grooming: Independent Feeding: Independent Bathing: Needs assistance Is this a change from baseline?: Pre-admission baseline Toileting: Needs assistance Is this a change from baseline?: Change from baseline, expected to last >3days In/Out Bed: Needs assistance Is this a change from baseline?: Pre-admission baseline Walks in Home: Needs assistance Is this a change from baseline?: Change from baseline, expected to last >3 days Does the patient have difficulty walking or climbing stairs?: Yes Weakness of Legs: Right Weakness of Arms/Hands: None  Permission Sought/Granted Permission sought to share information with : Family Supports Permission granted to share information with : Yes, Verbal Permission Granted  Share Information with NAME: Marolyn Hammock     Permission granted to share info w Relationship: neice  Permission granted to share info w Contact Information: [phone]  Emotional Assessment Appearance:: Appears stated age Attitude/Demeanor/Rapport: Gracious Affect (typically observed): Accepting,Pleasant Orientation: : Oriented to Self,Oriented to Place,Oriented to  Time,Oriented to Situation Alcohol / Substance Use: Not Applicable Psych Involvement: No (comment)  Admission diagnosis:  Femur fracture, right ([place]) [S72.91XA] Fall, initial encounter [W19.XXXA] Closed fracture of right hip, initial encounter [facility]) [S72.001A] Patient  Active Problem List   Diagnosis Date Noted   Leg edema 03/17/2020   Educated about COVID-19 virus infection 03/17/2020   Femur fracture, right ([place]) 03/17/2020   Closed comminuted left humeral fracture 09/24/2019   AKI (acute kidney injury) ([place]) 09/24/2019   Leukemoid reaction 09/24/2019   CAD (coronary artery disease) 02/14/2019   History of non-ST elevation myocardial infarction (NSTEMI) 02/14/2019   NSTEMI (non-ST elevated myocardial infarction) ([place]) 10/24/2018   Abnormal CT of the abdomen  see 03/2014 CT  CBD dilation referred to GI 05/05/2014   Osteoporosis  Repeatedly refuses Rx meds or injectables.  See [facility] reports 12/03/2013   Vitamin D deficiency 10/22/2013   Essential hypertension 10/22/2013   Mixed hyperlipidemia 10/22/2013   Spinal stenosis of lumbar region 10/22/2013   Sciatica 10/22/2013   PCP:  Vernie Shanks, MD Pharmacy:   [id number] [place], [id number]: [phone]: [phone]    Social Determinants of Health (SDOH) Interventions    Readmission Risk Interventions Readmission Risk Prevention Plan 03/18/2020  Transportation Screening Complete  PCP or Specialist Appt within 5-7 Days Complete  Home Care Screening Complete  Some recent data might be hidden

## 2020-03-19 ENCOUNTER — Inpatient Hospital Stay (HOSPITAL_COMMUNITY): Payer: Medicare HMO | Admitting: Anesthesiology

## 2020-03-19 ENCOUNTER — Encounter (HOSPITAL_COMMUNITY)
Admission: EM | Disposition: A | Discharge status: Home Health | Payer: Self-pay | Source: Home / Self Care | Attending: Internal Medicine

## 2020-03-19 ENCOUNTER — Inpatient Hospital Stay (HOSPITAL_COMMUNITY): Payer: Medicare HMO

## 2020-03-19 DIAGNOSIS — N179 Acute kidney failure, unspecified: Secondary | ICD-10-CM | POA: Diagnosis not present

## 2020-03-19 DIAGNOSIS — S72001A Fracture of unspecified part of neck of right femur, initial encounter for closed fracture: Secondary | ICD-10-CM | POA: Diagnosis not present

## 2020-03-19 HISTORY — PX: TOTAL HIP ARTHROPLASTY: SHX124

## 2020-03-19 LAB — COMPREHENSIVE METABOLIC PANEL
ALT: 16 U/L (ref 0–44)
AST: 35 U/L (ref 15–41)
Albumin: 3.9 g/dL (ref 3.5–5.0)
Alkaline Phosphatase: 110 U/L (ref 38–126)
Anion gap: 13 (ref 5–15)
BUN: 20 mg/dL (ref 8–23)
CO2: 21 mmol/L — ABNORMAL LOW (ref 22–32)
Calcium: 8.9 mg/dL (ref 8.9–10.3)
Chloride: 100 mmol/L (ref 98–111)
Creatinine, Ser: 1 mg/dL (ref 0.44–1.00)
GFR, Estimated: 55 mL/min — ABNORMAL LOW (ref >60)
Glucose, Bld: 122 mg/dL — ABNORMAL HIGH (ref 70–99)
Potassium: 4.5 mmol/L (ref 3.5–5.1)
Sodium: 134 mmol/L — ABNORMAL LOW (ref 135–145)
Total Bilirubin: 2.5 mg/dL — ABNORMAL HIGH (ref 0.3–1.2)
Total Protein: 7.4 g/dL (ref 6.5–8.1)

## 2020-03-19 LAB — CBC WITH DIFFERENTIAL/PLATELET
Abs Immature Granulocytes: 0.1 10*3/uL — ABNORMAL HIGH (ref 0.00–0.07)
Basophils Absolute: 0.1 10*3/uL (ref 0.0–0.1)
Basophils Relative: 0 %
Eosinophils Absolute: 0 10*3/uL (ref 0.0–0.5)
Eosinophils Relative: 0 %
HCT: 40.4 % (ref 36.0–46.0)
Hemoglobin: 13.3 g/dL (ref 12.0–15.0)
Immature Granulocytes: 1 %
Lymphocytes Relative: 4 %
Lymphs Abs: 0.7 10*3/uL (ref 0.7–4.0)
MCH: 30.8 pg (ref 26.0–34.0)
MCHC: 32.9 g/dL (ref 30.0–36.0)
MCV: 93.5 fL (ref 80.0–100.0)
Monocytes Absolute: 1.3 10*3/uL — ABNORMAL HIGH (ref 0.1–1.0)
Monocytes Relative: 8 %
Neutro Abs: 15.4 10*3/uL — ABNORMAL HIGH (ref 1.7–7.7)
Neutrophils Relative %: 87 %
Platelets: 300 10*3/uL (ref 150–400)
RBC: 4.32 MIL/uL (ref 3.87–5.11)
RDW: 14.5 % (ref 11.5–15.5)
WBC: 17.6 10*3/uL — ABNORMAL HIGH (ref 4.0–10.5)
nRBC: 0 % (ref 0.0–0.2)

## 2020-03-19 LAB — MAGNESIUM: Magnesium: 2.4 mg/dL (ref 1.7–2.4)

## 2020-03-19 SURGERY — ARTHROPLASTY, HIP, TOTAL, ANTERIOR APPROACH
Anesthesia: General | Site: Hip | Laterality: Right

## 2020-03-19 MED ORDER — PHENYLEPHRINE 40 MCG/ML (10ML) SYRINGE FOR IV PUSH (FOR BLOOD PRESSURE SUPPORT)
PREFILLED_SYRINGE | INTRAVENOUS | Status: DC | PRN
  Administered 2020-03-19 (×3): 80 ug via INTRAVENOUS

## 2020-03-19 MED ORDER — VANCOMYCIN HCL IN DEXTROSE 1-5 GM/200ML-% IV SOLN
1000.0000 mg | Freq: Two times a day (BID) | INTRAVENOUS | Status: AC
  Administered 2020-03-19: 1000 mg via INTRAVENOUS
  Filled 2020-03-19: qty 200

## 2020-03-19 MED ORDER — TRANEXAMIC ACID-NACL 1000-0.7 MG/100ML-% IV SOLN: 1000.0000 mg | INTRAVENOUS | Status: AC

## 2020-03-19 MED ORDER — LIDOCAINE HCL (PF) 2 % IJ SOLN
INTRAMUSCULAR | Status: AC
  Filled 2020-03-19: qty 5

## 2020-03-19 MED ORDER — ISOPROPYL ALCOHOL 70 % SOLN
Status: DC | PRN
  Administered 2020-03-19: 1 via TOPICAL

## 2020-03-19 MED ORDER — SODIUM CHLORIDE 0.9 % IR SOLN
Status: DC | PRN
  Administered 2020-03-19: 1000 mL

## 2020-03-19 MED ORDER — ONDANSETRON HCL 4 MG/2ML IJ SOLN: 4.0000 mg | Freq: Four times a day (QID) | INTRAMUSCULAR | Status: DC | PRN

## 2020-03-19 MED ORDER — CEFAZOLIN SODIUM-DEXTROSE 2-4 GM/100ML-% IV SOLN
2.0000 g | INTRAVENOUS | Status: AC
  Administered 2020-03-19: 2 g via INTRAVENOUS

## 2020-03-19 MED ORDER — KETOROLAC TROMETHAMINE 30 MG/ML IJ SOLN
INTRAMUSCULAR | Status: AC
  Filled 2020-03-19: qty 1

## 2020-03-19 MED ORDER — DOCUSATE SODIUM 100 MG PO CAPS
100.0000 mg | ORAL_CAPSULE | Freq: Two times a day (BID) | ORAL | Status: DC
  Administered 2020-03-19 – 2020-03-23 (×8): 100 mg via ORAL
  Filled 2020-03-19 (×7): qty 1

## 2020-03-19 MED ORDER — ONDANSETRON HCL 4 MG/2ML IJ SOLN
INTRAMUSCULAR | Status: AC
  Filled 2020-03-19: qty 2

## 2020-03-19 MED ORDER — SUGAMMADEX SODIUM 200 MG/2ML IV SOLN
INTRAVENOUS | Status: DC | PRN
  Administered 2020-03-19: 125 mg via INTRAVENOUS

## 2020-03-19 MED ORDER — METOCLOPRAMIDE HCL 5 MG/ML IJ SOLN: 5.0000 mg | Freq: Three times a day (TID) | INTRAMUSCULAR | Status: DC | PRN

## 2020-03-19 MED ORDER — ONDANSETRON HCL 4 MG/2ML IJ SOLN
INTRAMUSCULAR | Status: DC | PRN
  Administered 2020-03-19: 4 mg via INTRAVENOUS

## 2020-03-19 MED ORDER — POVIDONE-IODINE 10 % EX SWAB: 2.0000 "application " | Freq: Once | CUTANEOUS | Status: DC

## 2020-03-19 MED ORDER — KETOROLAC TROMETHAMINE 30 MG/ML IJ SOLN
INTRAMUSCULAR | Status: DC | PRN
  Administered 2020-03-19: 30 mg via INTRAVENOUS

## 2020-03-19 MED ORDER — ROCURONIUM BROMIDE 10 MG/ML (PF) SYRINGE
PREFILLED_SYRINGE | INTRAVENOUS | Status: DC | PRN
  Administered 2020-03-19: 40 mg via INTRAVENOUS

## 2020-03-19 MED ORDER — EPHEDRINE SULFATE-NACL 50-0.9 MG/10ML-% IV SOSY
PREFILLED_SYRINGE | INTRAVENOUS | Status: DC | PRN
  Administered 2020-03-19: 10 mg via INTRAVENOUS

## 2020-03-19 MED ORDER — DEXAMETHASONE SODIUM PHOSPHATE 10 MG/ML IJ SOLN
INTRAMUSCULAR | Status: DC | PRN
  Administered 2020-03-19: 5 mg via INTRAVENOUS

## 2020-03-19 MED ORDER — ONDANSETRON HCL 4 MG/2ML IJ SOLN
4.0000 mg | Freq: Once | INTRAMUSCULAR | Status: AC | PRN
  Administered 2020-03-19: 4 mg via INTRAVENOUS

## 2020-03-19 MED ORDER — SODIUM CHLORIDE 0.9 % IV SOLN
INTRAVENOUS | Status: DC | PRN
  Administered 2020-03-19: 1000 mL via INTRAVENOUS

## 2020-03-19 MED ORDER — PROPOFOL 10 MG/ML IV BOLUS
INTRAVENOUS | Status: DC | PRN
  Administered 2020-03-19: 80 mg via INTRAVENOUS

## 2020-03-19 MED ORDER — SODIUM CHLORIDE (PF) 0.9 % IJ SOLN
INTRAMUSCULAR | Status: DC | PRN
  Administered 2020-03-19: 30 mL

## 2020-03-19 MED ORDER — SODIUM CHLORIDE (PF) 0.9 % IJ SOLN
INTRAMUSCULAR | Status: AC
  Filled 2020-03-19: qty 30

## 2020-03-19 MED ORDER — ROCURONIUM BROMIDE 10 MG/ML (PF) SYRINGE
PREFILLED_SYRINGE | INTRAVENOUS | Status: AC
  Filled 2020-03-19: qty 10

## 2020-03-19 MED ORDER — VANCOMYCIN HCL IN DEXTROSE 1-5 GM/200ML-% IV SOLN
1000.0000 mg | INTRAVENOUS | Status: AC
  Administered 2020-03-19: 1000 mg via INTRAVENOUS
  Filled 2020-03-19: qty 200

## 2020-03-19 MED ORDER — BUPIVACAINE-EPINEPHRINE 0.25% -1:200000 IJ SOLN
INTRAMUSCULAR | Status: DC | PRN
  Administered 2020-03-19: 30 mL

## 2020-03-19 MED ORDER — FENTANYL CITRATE (PF) 250 MCG/5ML IJ SOLN
INTRAMUSCULAR | Status: DC | PRN
  Administered 2020-03-19 (×2): 50 ug via INTRAVENOUS
  Administered 2020-03-19: 100 ug via INTRAVENOUS
  Administered 2020-03-19: 50 ug via INTRAVENOUS

## 2020-03-19 MED ORDER — HYDROMORPHONE HCL 2 MG PO TABS: 1.0000 mg | ORAL_TABLET | ORAL | Status: DC | PRN

## 2020-03-19 MED ORDER — CHLORHEXIDINE GLUCONATE 4 % EX LIQD: 60.0000 mL | Freq: Once | CUTANEOUS | Status: DC

## 2020-03-19 MED ORDER — EPHEDRINE 5 MG/ML INJ
INTRAVENOUS | Status: AC
  Filled 2020-03-19: qty 10

## 2020-03-19 MED ORDER — METOCLOPRAMIDE HCL 5 MG PO TABS: 5.0000 mg | ORAL_TABLET | Freq: Three times a day (TID) | ORAL | Status: DC | PRN

## 2020-03-19 MED ORDER — FENTANYL CITRATE (PF) 100 MCG/2ML IJ SOLN
25.0000 ug | INTRAMUSCULAR | Status: DC | PRN
Start: 2020-03-19 — End: 2020-03-19

## 2020-03-19 MED ORDER — FENTANYL CITRATE (PF) 250 MCG/5ML IJ SOLN
INTRAMUSCULAR | Status: AC
  Filled 2020-03-19: qty 5

## 2020-03-19 MED ORDER — LACTATED RINGERS IV SOLN: INTRAVENOUS | Status: DC | PRN

## 2020-03-19 MED ORDER — LIDOCAINE 2% (20 MG/ML) 5 ML SYRINGE
INTRAMUSCULAR | Status: DC | PRN
  Administered 2020-03-19: 60 mg via INTRAVENOUS

## 2020-03-19 MED ORDER — TRANEXAMIC ACID-NACL 1000-0.7 MG/100ML-% IV SOLN
1000.0000 mg | INTRAVENOUS | Status: AC
  Administered 2020-03-19: 1000 mg via INTRAVENOUS

## 2020-03-19 MED ORDER — MENTHOL 3 MG MT LOZG: 1.0000 | LOZENGE | OROMUCOSAL | Status: DC | PRN

## 2020-03-19 MED ORDER — TRANEXAMIC ACID-NACL 1000-0.7 MG/100ML-% IV SOLN
INTRAVENOUS | Status: AC
  Filled 2020-03-19: qty 100

## 2020-03-19 MED ORDER — PHENOL 1.4 % MT LIQD: 1.0000 | OROMUCOSAL | Status: DC | PRN

## 2020-03-19 MED ORDER — BUPIVACAINE-EPINEPHRINE (PF) 0.25% -1:200000 IJ SOLN
INTRAMUSCULAR | Status: AC
  Filled 2020-03-19: qty 30

## 2020-03-19 MED ORDER — ONDANSETRON HCL 4 MG PO TABS: 4.0000 mg | ORAL_TABLET | Freq: Four times a day (QID) | ORAL | Status: DC | PRN

## 2020-03-19 MED ORDER — CEFAZOLIN SODIUM-DEXTROSE 2-4 GM/100ML-% IV SOLN
INTRAVENOUS | Status: AC
  Filled 2020-03-19: qty 100

## 2020-03-19 MED ORDER — DEXAMETHASONE SODIUM PHOSPHATE 10 MG/ML IJ SOLN
INTRAMUSCULAR | Status: AC
  Filled 2020-03-19: qty 1

## 2020-03-19 MED ORDER — PROPOFOL 10 MG/ML IV BOLUS
INTRAVENOUS | Status: AC
  Filled 2020-03-19: qty 20

## 2020-03-19 MED ORDER — ENOXAPARIN SODIUM 30 MG/0.3ML ~~LOC~~ SOLN
30.0000 mg | SUBCUTANEOUS | Status: DC
  Administered 2020-03-20 – 2020-03-23 (×4): 30 mg via SUBCUTANEOUS
  Filled 2020-03-19 (×4): qty 0.3

## 2020-03-19 SURGICAL SUPPLY — 67 items
ADH SKN CLS APL DERMABOND .7 (GAUZE/BANDAGES/DRESSINGS) ×2
APL PRP STRL LF DISP 70% ISPRP (MISCELLANEOUS) ×1
BAG DECANTER FOR FLEXI CONT (MISCELLANEOUS) IMPLANT
BAG SPEC THK2 15X12 ZIP CLS (MISCELLANEOUS)
BAG ZIPLOCK 12X15 (MISCELLANEOUS) IMPLANT
BLADE SURG SZ10 CARB STEEL (BLADE) IMPLANT
CHLORAPREP W/TINT 26 (MISCELLANEOUS) ×2 IMPLANT
COVER PERINEAL POST (MISCELLANEOUS) ×2 IMPLANT
COVER SURGICAL LIGHT HANDLE (MISCELLANEOUS) ×2 IMPLANT
COVER WAND RF STERILE (DRAPES) IMPLANT
CUP ACET PINNACLE SECTR 48MM (Joint) IMPLANT
DECANTER SPIKE VIAL GLASS SM (MISCELLANEOUS) ×1 IMPLANT
DERMABOND ADVANCED (GAUZE/BANDAGES/DRESSINGS) ×2
DERMABOND ADVANCED .7 DNX12 (GAUZE/BANDAGES/DRESSINGS) ×2 IMPLANT
DRAPE IMP U-DRAPE 54X76 (DRAPES) ×2 IMPLANT
DRAPE SHEET LG 3/4 BI-LAMINATE (DRAPES) ×6 IMPLANT
DRAPE STERI IOBAN 125X83 (DRAPES) IMPLANT
DRAPE U-SHAPE 47X51 STRL (DRAPES) ×4 IMPLANT
DRESSING AQUACEL AG SP 3.5X10 (GAUZE/BANDAGES/DRESSINGS) IMPLANT
DRSG AQUACEL AG ADV 3.5X10 (GAUZE/BANDAGES/DRESSINGS) ×2 IMPLANT
DRSG AQUACEL AG SP 3.5X10 (GAUZE/BANDAGES/DRESSINGS) ×2
ELECT REM PT RETURN 15FT ADLT (MISCELLANEOUS) ×2 IMPLANT
GAUZE SPONGE 4X4 12PLY STRL (GAUZE/BANDAGES/DRESSINGS) ×2 IMPLANT
GLOVE BIO SURGEON STRL SZ8.5 (GLOVE) ×4 IMPLANT
GLOVE BIOGEL PI IND STRL 8 (GLOVE) ×1 IMPLANT
GLOVE BIOGEL PI IND STRL 8.5 (GLOVE) ×1 IMPLANT
GLOVE BIOGEL PI INDICATOR 8 (GLOVE) ×1
GLOVE BIOGEL PI INDICATOR 8.5 (GLOVE) ×1
GLOVE SURG ENC TEXT LTX SZ7.5 (GLOVE) ×4 IMPLANT
GOWN SPEC L3 XXLG W/TWL (GOWN DISPOSABLE) ×2 IMPLANT
GOWN STRL REUS W/ TWL LRG LVL3 (GOWN DISPOSABLE) ×1 IMPLANT
GOWN STRL REUS W/TWL LRG LVL3 (GOWN DISPOSABLE) ×2
HANDPIECE INTERPULSE COAX TIP (DISPOSABLE) ×2
HEAD FEM STD 32X+5 STRL (Hips) ×1 IMPLANT
HOLDER FOLEY CATH W/STRAP (MISCELLANEOUS) ×2 IMPLANT
HOOD PEEL AWAY FLYTE STAYCOOL (MISCELLANEOUS) ×8 IMPLANT
JET LAVAGE IRRISEPT WOUND (IRRIGATION / IRRIGATOR) ×2
KIT TURNOVER KIT A (KITS) ×1 IMPLANT
LAVAGE JET IRRISEPT WOUND (IRRIGATION / IRRIGATOR) ×1 IMPLANT
LINER ACET 32X48 (Liner) ×1 IMPLANT
MANIFOLD NEPTUNE II (INSTRUMENTS) ×2 IMPLANT
MARKER SKIN DUAL TIP RULER LAB (MISCELLANEOUS) ×2 IMPLANT
NDL SAFETY ECLIPSE 18X1.5 (NEEDLE) ×1 IMPLANT
NDL SPNL 18GX3.5 QUINCKE PK (NEEDLE) ×1 IMPLANT
NEEDLE HYPO 18GX1.5 SHARP (NEEDLE) ×2
NEEDLE SPNL 18GX3.5 QUINCKE PK (NEEDLE) ×2 IMPLANT
PACK ANTERIOR HIP CUSTOM (KITS) ×2 IMPLANT
PENCIL SMOKE EVACUATOR (MISCELLANEOUS) ×1 IMPLANT
PINNSECTOR W/GRIP ACE CUP 48MM (Joint) ×2 IMPLANT
SAW OSC TIP CART 19.5X105X1.3 (SAW) ×2 IMPLANT
SEALER BIPOLAR AQUA 6.0 (INSTRUMENTS) ×2 IMPLANT
SET HNDPC FAN SPRY TIP SCT (DISPOSABLE) ×1 IMPLANT
STEM TRI LOC BPS SZ3 W GRIPTON IMPLANT
SUT ETHIBOND NAB CT1 #1 30IN (SUTURE) ×4 IMPLANT
SUT MNCRL AB 3-0 PS2 18 (SUTURE) ×2 IMPLANT
SUT MNCRL AB 4-0 PS2 18 (SUTURE) ×2 IMPLANT
SUT MON AB 2-0 CT1 36 (SUTURE) ×4 IMPLANT
SUT STRATAFIX PDO 1 14 VIOLET (SUTURE) ×2
SUT STRATFX PDO 1 14 VIOLET (SUTURE) ×1
SUT VIC AB 2-0 CT1 27 (SUTURE) ×2
SUT VIC AB 2-0 CT1 TAPERPNT 27 (SUTURE) ×1 IMPLANT
SUTURE STRATFX PDO 1 14 VIOLET (SUTURE) ×1 IMPLANT
SYR 3ML LL SCALE MARK (SYRINGE) ×2 IMPLANT
TRAY FOLEY MTR SLVR 16FR STAT (SET/KITS/TRAYS/PACK) IMPLANT
TRI LOC BPS SZ 3 W GRIPTON ×2 IMPLANT
TUBE SUCTION HIGH CAP CLEAR NV (SUCTIONS) ×2 IMPLANT
WATER STERILE IRR 1000ML POUR (IV SOLUTION) ×2 IMPLANT

## 2020-03-19 NOTE — Transfer of Care (Signed)
Immediate Anesthesia Transfer of Care Note  Patient: Tracy Rogers  Procedure(s) Performed: TOTAL HIP ARTHROPLASTY ANTERIOR APPROACH (Right Hip)  Patient Location: PACU  Anesthesia Type:General  Level of Consciousness: awake, drowsy and responds to stimulation  Airway & Oxygen Therapy: Patient Spontanous Breathing and Patient connected to face mask oxygen  Post-op Assessment: Report given to RN and Post -op Vital signs reviewed and stable  Post vital signs: Reviewed and stable  Last Vitals:  Vitals Value Taken Time  BP 148/83 03/19/20 1003  Temp    Pulse 72 03/19/20 1005  Resp 20 03/19/20 1005  SpO2 97 % 03/19/20 1005  Vitals shown include unvalidated device data.  Last Pain:  Vitals:   03/19/20 0512  TempSrc: Oral  PainSc:       Patients Stated Pain Goal: 1 (57/97/28 2060)  Complications: No complications documented.

## 2020-03-19 NOTE — Interval H&P Note (Signed)
History and Physical Interval Note:  03/19/2020 7:54 AM  Tracy Rogers  has presented today for surgery, with the diagnosis of right hip fracture.  The various methods of treatment have been discussed with the patient and family. After consideration of risks, benefits and other options for treatment, the patient has consented to  Procedure(s): TOTAL HIP ARTHROPLASTY ANTERIOR APPROACH (Right) as a surgical intervention.  The patient's history has been reviewed, patient examined, no change in status, stable for surgery.  I have reviewed the patient's chart and labs.  Questions were answered to the patient's satisfaction.    The risks, benefits, and alternatives were discussed with the patient. There are risks associated with the surgery including, but not limited to, problems with anesthesia (death), infection, instability (giving out of the joint), dislocation, differences in leg length/angulation/rotation, fracture of bones, loosening or failure of implants, hematoma (blood accumulation) which may require surgical drainage, blood clots, pulmonary embolism, nerve injury (foot drop and lateral thigh numbness), and blood vessel injury. The patient understands these risks and elects to proceed.    Hilton Cork Deby Adger

## 2020-03-19 NOTE — Discharge Instructions (Signed)
°Dr. Xxavier Noon °Joint Replacement Specialist °[facility] °[address] °[place], [place] [address] °[phone] ° ° °TOTAL HIP REPLACEMENT POSTOPERATIVE DIRECTIONS ° ° ° °Hip Rehabilitation, Guidelines Following Surgery  ° °WEIGHT BEARING °Weight bearing as tolerated with assist device (walker, cane, etc) as directed, use it as long as suggested by your surgeon or therapist, typically at least 4-6 weeks. ° °The results of a hip operation are greatly improved after range of motion and muscle strengthening exercises. Follow all safety measures which are given to protect your hip. If any of these exercises cause increased pain or swelling in your joint, decrease the amount until you are comfortable again. Then slowly increase the exercises. Call your caregiver if you have problems or questions.  ° °HOME CARE INSTRUCTIONS  °Most of the following instructions are designed to prevent the dislocation of your new hip.  °Remove items at home which could result in a fall. This includes throw rugs or furniture in walking pathways.  °Continue medications as instructed at time of discharge. °· You may have some home medications which will be placed on hold until you complete the course of blood thinner medication. °· You may start showering once you are discharged home. Do not remove your dressing. °Do not put on socks or shoes without following the instructions of your caregivers.   °Sit on chairs with arms. Use the chair arms to help push yourself up when arising.  °Arrange for the use of a toilet seat elevator so you are not sitting low.  °· Walk with walker as instructed.  °You may resume a sexual relationship in one month or when given the OK by your caregiver.  °Use walker as long as suggested by your caregivers.  °You may put full weight on your legs and walk as much as is comfortable. °Avoid periods of inactivity such as sitting longer than an hour when not asleep. This helps prevent  blood clots.  °You may return to work once you are cleared by your surgeon.  °Do not drive a car for 6 weeks or until released by your surgeon.  °Do not drive while taking narcotics.  °Wear elastic stockings for two weeks following surgery during the day but you may remove then at night.  °Make sure you keep all of your appointments after your operation with all of your doctors and caregivers. You should call the office at the above phone number and make an appointment for approximately two weeks after the date of your surgery. °Please pick up a stool softener and laxative for home use as long as you are requiring pain medications. °· ICE to the affected hip every three hours for 30 minutes at a time and then as needed for pain and swelling. Continue to use ice on the hip for pain and swelling from surgery. You may notice swelling that will progress down to the foot and ankle.  This is normal after surgery.  Elevate the leg when you are not up walking on it.   °It is important for you to complete the blood thinner medication as prescribed by your doctor. °· Continue to use the breathing machine which will help keep your temperature down.  It is common for your temperature to cycle up and down following surgery, especially at night when you are not up moving around and exerting yourself.  The breathing machine keeps your lungs expanded and your temperature down. ° °RANGE OF MOTION AND STRENGTHENING EXERCISES  °These exercises are   designed to help you keep full movement of your hip joint. Follow your caregiver's or physical therapist's instructions. Perform all exercises about fifteen times, three times per day or as directed. Exercise both hips, even if you have had only one joint replacement. These exercises can be done on a training (exercise) mat, on the floor, on a table or on a bed. Use whatever works the best and is most comfortable for you. Use music or television while you are exercising so that the exercises  are a pleasant break in your day. This will make your life better with the exercises acting as a break in routine you can look forward to.  °Lying on your back, slowly slide your foot toward your buttocks, raising your knee up off the floor. Then slowly slide your foot back down until your leg is straight again.  °Lying on your back spread your legs as far apart as you can without causing discomfort.  °Lying on your side, raise your upper leg and foot straight up from the floor as far as is comfortable. Slowly lower the leg and repeat.  °Lying on your back, tighten up the muscle in the front of your thigh (quadriceps muscles). You can do this by keeping your leg straight and trying to raise your heel off the floor. This helps strengthen the largest muscle supporting your knee.  °Lying on your back, tighten up the muscles of your buttocks both with the legs straight and with the knee bent at a comfortable angle while keeping your heel on the floor.  ° °SKILLED REHAB INSTRUCTIONS: °If the patient is transferred to a skilled rehab facility following release from the hospital, a list of the current medications will be sent to the facility for the patient to continue.  When discharged from the skilled rehab facility, please have the facility set up the patient's Home Health Physical Therapy prior to being released. Also, the skilled facility will be responsible for providing the patient with their medications at time of release from the facility to include their pain medication and their blood thinner medication. If the patient is still at the rehab facility at time of the two week follow up appointment, the skilled rehab facility will also need to assist the patient in arranging follow up appointment in our office and any transportation needs. ° °MAKE SURE YOU:  °Understand these instructions.  °Will watch your condition.  °Will get help right away if you are not doing well or get worse. ° °Pick up stool softner and  laxative for home use following surgery while on pain medications. °Do not remove your dressing. °The dressing is waterproof--it is OK to take showers. °Continue to use ice for pain and swelling after surgery. °Do not use any lotions or creams on the incision until instructed by your surgeon. °Total Hip Protocol. ° ° °

## 2020-03-19 NOTE — Progress Notes (Signed)
PROGRESS NOTE    Tracy Rogers  SNK:[id number] DOB: Aug 23, 1930 DOA: 03/17/2020 PCP: Vernie Shanks, MD    Chief Complaint  Patient presents with  . Fall  . Hip Injury    Brief Narrative:  Tracy Rogers is a 86 y.o. female with medical history significant of coronary artery disease, hypertension, hyperlipidemia, osteopenia with osteoporosis, multiple fractures in the past, spinal stenosis and osteoarthritis who presents after sustaining mechanical fall at home. She was seen today by her cardiologist for regular visit. She has been on Brilinta and was told she can discontinue that. Patient walking to her car when she suddenly lost her step and fell. She believes her right knee gave out on her. She fell landing on her right side. Immediately felt pain rated as 9 out of 10. She denied any other symptoms. Did not feel dizzy prior to the fall did not pass out. Did not hit her head. Patient came to the ER where she was seen and evaluated. She is being admitted with right femur fracture. She was previously seen by Dr. Onnie Graham for her humeral fracture in September of this year..  Subjective:  She returned from the OR, currently no pain at rest She is very weak, no appetite  Assessment & Plan:   Principal Problem:   Femur fracture, right ([place]) Active Problems:   Essential hypertension   Mixed hyperlipidemia   Osteoporosis  Repeatedly refuses Rx meds or injectables.  See [facility] reports   CAD (coronary artery disease)   AKI (acute kidney injury) ([place])   Displaced right femoral neck fracture with history of osteoporosis -Os/p  total right hip arthroplasty today on 12/24 , will follow Ortho recommendation  Leukocytosis Stress versus dehydration,  UA uremarkable Repeat cbc in am  AKI/azotemia BUN 33 creatinine 1.3 on presentation, after hydration BUN 24 creatinine 0.96 this morning UA no infection Hold Lasix Received  hydration Monitor volume status  CAD -Stable, just  saw her cardiologist yesterday, she is cleared to go off her Brilinta Brilinta discontinued  Hyperlipidemia continue statin  Hypertension, on Norvasc and metoprolol at home, hold Lasix for now   DVT prophylaxis: enoxaparin (LOVENOX) injection 30 mg Start: 03/20/20 0800 SCDs Start: 03/19/20 1115 SCDs Start: 03/17/20 2046   Code Status: Full Family Communication: Patient Disposition:   Status is: Inpatient  Dispo: The patient is from: Home              Anticipated d/c is to: To be determined, pending therapy eval              Anticipated d/c date is: Pending therapy eval                Consultants:   Orthopedic  Procedures:   Right hip arthroplasty  Antimicrobials:   Perioperative Ancef     Objective: Vitals:   03/19/20 1211 03/19/20 1303 03/19/20 1344 03/19/20 1728  BP: 138/77 135/78 135/83 131/71  Pulse: 71 69 67 78  Resp: 16 14 17 18   Temp: 97.8 F (36.6 C) 97.7 F (36.5 C) 97.6 F (36.4 C) 98.1 F (36.7 C)  TempSrc: Oral  Oral Oral  SpO2: 96% 98% 100% 95%  Weight:      Height:        Intake/Output Summary (Last 24 hours) at 03/19/2020 1754 Last data filed at 03/19/2020 1537 Gross per 24 hour  Intake 3392.92 ml  Output 650 ml  Net 2742.92 ml   Filed Weights   03/17/20 1647  Weight: 55.3 kg  Examination:  General exam: Frail, weak, NAD Respiratory system: Clear to auscultation. Respiratory effort normal. Cardiovascular system: S1 & S2 heard, RRR. No JVD, no murmur, No pedal edema. Gastrointestinal system: Abdomen is nondistended, soft and nontender. Normal bowel sounds heard. Central nervous system: Alert and oriented. No focal neurological deficits. Extremities: Right hip postop changes Skin: No rashes, lesions or ulcers Psychiatry: Anxious    Data Reviewed: I have personally reviewed following labs and imaging studies  CBC: Recent Labs  Lab 03/17/20 1654 03/18/20 0320 03/19/20 0317  WBC 13.4* 13.2* 17.6*  NEUTROABS 9.2*  --   15.4*  HGB 13.3 11.9* 13.3  HCT 41.0 36.1 40.4  MCV 95.6 95.0 93.5  PLT 311 254 300    Basic Metabolic Panel: Recent Labs  Lab 03/17/20 1654 03/18/20 0320 03/19/20 0317  NA 140 138 134*  K 3.5 3.6 4.5  CL 103 104 100  CO2 23 22 21*  GLUCOSE 121* 133* 122*  BUN 33* 24* 20  CREATININE 1.30* 0.96 1.00  CALCIUM 9.5 8.9 8.9  MG  --   --  2.4    GFR: Estimated Creatinine Clearance: 29.9 mL/min (by C-G formula based on SCr of 1 mg/dL).  Liver Function Tests: Recent Labs  Lab 03/18/20 0320 03/19/20 0317  AST 33 35  ALT 23 16  ALKPHOS 111 110  BILITOT 1.6* 2.5*  PROT 7.2 7.4  ALBUMIN 4.2 3.9    CBG: No results for input(s): GLUCAP in the last 168 hours.   Recent Results (from the past 240 hour(s))  Resp Panel by RT-PCR (Flu A&B, Covid) Nasopharyngeal Swab     Status: None   Collection Time: 03/17/20  5:10 PM   Specimen: Nasopharyngeal Swab; Nasopharyngeal(NP) swabs in vial transport medium  Result Value Ref Range Status   SARS Coronavirus 2 by RT PCR NEGATIVE NEGATIVE Final    Comment: (NOTE) SARS-CoV-2 target nucleic acids are NOT DETECTED.  The SARS-CoV-2 RNA is generally detectable in upper respiratory specimens during the acute phase of infection. The lowest concentration of SARS-CoV-2 viral copies this assay can detect is 138 copies/mL. A negative result does not preclude SARS-Cov-2 infection and should not be used as the sole basis for treatment or other patient management decisions. A negative result may occur with  improper specimen collection/handling, submission of specimen other than nasopharyngeal swab, presence of viral mutation(s) within the areas targeted by this assay, and inadequate number of viral copies(<138 copies/mL). A negative result must be combined with clinical observations, patient history, and epidemiological information. The expected result is Negative.  Fact Sheet for Patients:  BloggerCourse.com  Fact  Sheet for Healthcare Providers:  SeriousBroker.it  This test is no t yet approved or cleared by the [place] FDA and  has been authorized for detection and/or diagnosis of SARS-CoV-2 by FDA under an Emergency Use Authorization (EUA). This EUA will remain  in effect (meaning this test can be used) for the duration of the COVID-19 declaration under Section 564(b)(1) of the Act, 21 U.S.C.section 360bbb-3(b)(1), unless the authorization is terminated  or revoked sooner.       Influenza A by PCR NEGATIVE NEGATIVE Final   Influenza B by PCR NEGATIVE NEGATIVE Final    Comment: (NOTE) The Xpert Xpress SARS-CoV-2/FLU/RSV plus assay is intended as an aid in the diagnosis of influenza from Nasopharyngeal swab specimens and should not be used as a sole basis for treatment. Nasal washings and aspirates are unacceptable for Xpert Xpress SARS-CoV-2/FLU/RSV testing.  Fact Sheet for Patients:  BloggerCourse.com  Fact Sheet for Healthcare Providers: SeriousBroker.it  This test is not yet approved or cleared by the [place] FDA and has been authorized for detection and/or diagnosis of SARS-CoV-2 by FDA under an Emergency Use Authorization (EUA). This EUA will remain in effect (meaning this test can be used) for the duration of the COVID-19 declaration under Section 564(b)(1) of the Act, 21 U.S.C. section 360bbb-3(b)(1), unless the authorization is terminated or revoked.  Performed at [facility], [address] [place], [place] [address]   Surgical PCR screen     Status: Abnormal   Collection Time: 03/17/20  9:01 PM   Specimen: Nasal Mucosa; Nasal Swab  Result Value Ref Range Status   MRSA, PCR NEGATIVE NEGATIVE Final   Staphylococcus aureus POSITIVE (A) NEGATIVE Final    Comment: (NOTE) The Xpert SA Assay (FDA approved for NASAL specimens in patients 45 years of age and older), is one  component of a comprehensive surveillance program. It is not intended to diagnose infection nor to guide or monitor treatment. Performed at [facility], [address], [place], [place] [address]          Radiology Studies: Pelvis Portable  Result Date: 03/19/2020 CLINICAL DATA:  Status post right hip replacement EXAM: PORTABLE PELVIS 1-2 VIEWS COMPARISON:  Intraoperative films from earlier in the same day. FINDINGS: Right hip replacement is noted in satisfactory position. No bony or soft tissue abnormality is noted. IMPRESSION: Status post right hip replacement. Electronically Signed   By: Alcide Clever M.D.   On: 03/19/2020 11:51   DG C-Arm 1-60 Min-No Report  Result Date: 03/19/2020 Fluoroscopy was utilized by the requesting physician.  No radiographic interpretation.   DG HIP OPERATIVE UNILAT W OR W/O PELVIS RIGHT  Result Date: 03/19/2020 CLINICAL DATA:  Right hip replacement. EXAM: OPERATIVE RIGHT HIP (WITH PELVIS IF PERFORMED) 2 VIEWS TECHNIQUE: Fluoroscopic spot image(s) were submitted for interpretation post-operatively. COMPARISON:  03/17/2020 FINDINGS: 4 intraoperative spot fluoro images were obtained. Initial 2 images show femoral neck fracture is visualized on the film from 03/17/2020. Subsequent 2 images obtained after total hip replacement. No complicating features. IMPRESSION: Intraoperative assessment during right total hip replacement. No complicating features. Electronically Signed   By: Kennith Center M.D.   On: 03/19/2020 09:39        Scheduled Meds: . amLODipine  5 mg Oral Daily   And  . amLODipine  2.5 mg Oral QHS  . chlorhexidine  60 mL Topical Once  . Chlorhexidine Gluconate Cloth  6 each Topical Q0600  . cholecalciferol  2,000 Units Oral Daily  . docusate sodium  100 mg Oral BID  . [START ON 03/20/2020] enoxaparin (LOVENOX) injection  30 mg Subcutaneous Q24H  . magnesium oxide  400 mg Oral Daily  . metoprolol tartrate  25 mg Oral  BID  . multivitamin with minerals  1 tablet Oral Daily  . mupirocin ointment  1 application Nasal BID  . ondansetron      . pantoprazole  40 mg Oral Daily  . povidone-iodine  2 application Topical Once  . povidone-iodine  2 application Topical Once  . rosuvastatin  10 mg Oral Daily  . senna-docusate  1 tablet Oral BID  . zinc sulfate  220 mg Oral Daily   Continuous Infusions: . sodium chloride 1,000 mL (03/19/20 1254)  . tranexamic acid    . vancomycin       LOS: 2 days   Time spent: Greater than 50%  of this time was spent in counseling, explanation of diagnosis, planning of further management, and coordination of care.  I have personally reviewed and interpreted on  03/19/2020 daily labs,I reviewed all nursing notes, pharmacy notes, consultant notes,  vitals, pertinent old records  I have discussed plan of care as described above with RN , patient  on 03/19/2020  Voice Recognition /Dragon dictation system was used to create this note, attempts have been made to correct errors. Please contact the author with questions and/or clarifications.   Florencia Reasons, MD PhD FACP [facility]  Available via Epic secure chat 7am-7pm for nonurgent issues Please page for urgent issues To page the attending provider between 7A-7P or the covering provider during after hours 7P-7A, please log into the web site [email or web address] and access using universal [place] password for that web site. If you do not have the password, please call the hospital operator.    03/19/2020, 5:54 PM

## 2020-03-19 NOTE — TOC Progression Note (Signed)
Transition of Care [facility] - Progression Note    Patient Details  Name: Chinyere Galiano MRN: [id number] Date of Birth: 08-19-1930  Transition of Care [facility]) CM/SW Contact  Lennart Pall, LCSW Phone Number: 03/19/2020, 11:17 AM  Clinical Narrative:     Spoke with pt's niece, Denny Peon, this morning about probable dc needs for pt.  Niece is very concerned about pt returning directly home.  Explained that I did briefly discuss with pt yesterday that we will await recommendations from therapies but likely that SNF will be the plan.  Niece agrees that this is the best option but unsure if patient will agree.  TOC will follow up after therapies have evaluated.  Niece asks that she be called/ speaker phone available when dc discussions are had with patient.  Expected Discharge Plan: [place] (vs SNF) Barriers to Discharge: Continued Medical Work up  Expected Discharge Plan and Services Expected Discharge Plan: [place] (vs SNF)       Living arrangements for the past 2 months: Single Family Home                                       Social Determinants of Health (SDOH) Interventions    Readmission Risk Interventions Readmission Risk Prevention Plan 03/18/2020  Transportation Screening Complete  PCP or Specialist Appt within 5-7 Days Complete  Home Care Screening Complete  Some recent data might be hidden

## 2020-03-19 NOTE — Op Note (Signed)
OPERATIVE REPORT  SURGEON: Rod Can, MD   ASSISTANT: Cherlynn June, PA-C  PREOPERATIVE DIAGNOSIS: Displaced Right femoral neck fracture.   POSTOPERATIVE DIAGNOSIS: Displaced Right femoral neck fracture.   PROCEDURE: Right total hip arthroplasty, anterior approach.   IMPLANTS: DePuy Tri Lock stem, size 3, hi offset. DePuy Pinnacle Cup, size 48 mm. DePuy Altrx liner, size 32 by 48 mm, neutral. DePuy metal head ball, size 32 + 5 mm.  ANESTHESIA:  General  ANTIBIOTICS: 2g ancef. 1g vancomycin (history of MRSA).  ESTIMATED BLOOD LOSS:-200 mL    DRAINS: None.  COMPLICATIONS: None   CONDITION: PACU - hemodynamically stable.   BRIEF CLINICAL NOTE: Tracy Rogers is a 86 y.o. female with a displaced Right femoral neck fracture. The patient was admitted to the hospitalist service and underwent perioperative risk stratification and medical optimization. The risks, benefits, and alternatives to total hip arthroplasty were explained, and the patient elected to proceed.  PROCEDURE IN DETAIL: The patient was taken to the operating room and general anesthesia was induced on the hospital bed.  The patient was then positioned on the Hana table.  All bony prominences were well padded.  The hip was prepped and draped in the normal sterile surgical fashion.  A time-out was called verifying side and site of surgery. Antibiotics were given within 60 minutes of beginning the procedure.  The direct anterior approach to the hip was performed through the Hueter interval.  Lateral femoral circumflex vessels were treated with the Auqumantys. The anterior capsule was exposed and an inverted T capsulotomy was made.  Fracture hematoma was encountered and evacuated. The patient was found to have a comminuted Right subcapital femoral neck fracture.  I freshened the femoral neck cut with a saw.  I removed the femoral neck fragment.  A corkscrew was placed into the head and the head was removed.   This was passed to the back table and was measured.   Acetabular exposure was achieved, and the pulvinar and labrum were excised. Sequential reaming of the acetabulum was then performed up to a size 47 mm reamer. A 48 mm cup was then opened and impacted into place at approximately 40 degrees of abduction and 20 degrees of anteversion. The final polyethylene liner was impacted into place.    I then gained femoral exposure taking care to protect the abductors and greater trochanter.  This was performed using standard external rotation, extension, and adduction.  The capsule was peeled off the inner aspect of the greater trochanter, taking care to preserve the short external rotators. A cookie cutter was used to enter the femoral canal, and then the femoral canal finder was used to confirm location.  I then sequentially broached up to a size 3.  Calcar planer was used on the femoral neck remnant.  I paced a hi neck and a trial head ball. The hip was reduced.  Leg lengths were checked fluoroscopically.  The hip was dislocated and trial components were removed.  I placed the real stem followed by the real head ball.  The hip was reduced.  Fluoroscopy was used to confirm component position and leg lengths.  At 90 degrees of external rotation and extension, the hip was stable to an anterior directed force.   The wound was copiously irrigated with Irrisept solution and normal saline using pule lavage.  Marcaine solution was injected into the periarticular soft tissue.  The wound was closed in layers using #1 Vicryl and V-Loc for the fascia, 2-0 Vicryl for the subcutaneous  fat, 2-0 Monocryl for the deep dermal layer, 3-0 running Monocryl subcuticular stitch and glue for the skin.  Once the glue was fully dried, an Aquacell Ag dressing was applied.  The patient was then awakened from anesthesia and transported to the recovery room in stable condition.  Sponge, needle, and instrument counts were correct at the end of the  case x2.  The patient tolerated the procedure well and there were no known complications.  Please note that a surgical assistant was a medical necessity for this procedure to perform it in a safe and expeditious manner. Assistant was necessary to provide appropriate retraction of vital neurovascular structures, to prevent femoral fracture, and to allow for anatomic placement of the prosthesis.

## 2020-03-19 NOTE — Anesthesia Procedure Notes (Signed)
Procedure Name: Intubation Date/Time: 03/19/2020 8:05 AM Performed by: Lollie Sails, CRNA Pre-anesthesia Checklist: Patient identified, Emergency Drugs available, Suction available, Patient being monitored and Timeout performed Patient Re-evaluated:Patient Re-evaluated prior to induction Oxygen Delivery Method: Circle system utilized Preoxygenation: Pre-oxygenation with 100% oxygen Induction Type: IV induction Ventilation: Mask ventilation without difficulty Laryngoscope Size: Miller and 3 Grade View: Grade II Tube type: Oral Tube size: 7.0 mm Number of attempts: 1 Airway Equipment and Method: Stylet Placement Confirmation: ETT inserted through vocal cords under direct vision,  positive ETCO2 and breath sounds checked- equal and bilateral Secured at: 22 cm Tube secured with: Tape Dental Injury: Teeth and Oropharynx as per pre-operative assessment

## 2020-03-20 DIAGNOSIS — S72001A Fracture of unspecified part of neck of right femur, initial encounter for closed fracture: Secondary | ICD-10-CM | POA: Diagnosis not present

## 2020-03-20 DIAGNOSIS — N179 Acute kidney failure, unspecified: Secondary | ICD-10-CM | POA: Diagnosis not present

## 2020-03-20 LAB — BASIC METABOLIC PANEL
Anion gap: 12 (ref 5–15)
BUN: 29 mg/dL — ABNORMAL HIGH (ref 8–23)
CO2: 21 mmol/L — ABNORMAL LOW (ref 22–32)
Calcium: 8.9 mg/dL (ref 8.9–10.3)
Chloride: 100 mmol/L (ref 98–111)
Creatinine, Ser: 1.24 mg/dL — ABNORMAL HIGH (ref 0.44–1.00)
GFR, Estimated: 42 mL/min — ABNORMAL LOW (ref >60)
Glucose, Bld: 128 mg/dL — ABNORMAL HIGH (ref 70–99)
Potassium: 4.7 mmol/L (ref 3.5–5.1)
Sodium: 133 mmol/L — ABNORMAL LOW (ref 135–145)

## 2020-03-20 LAB — CBC
HCT: 31.8 % — ABNORMAL LOW (ref 36.0–46.0)
Hemoglobin: 10.5 g/dL — ABNORMAL LOW (ref 12.0–15.0)
MCH: 31.4 pg (ref 26.0–34.0)
MCHC: 33 g/dL (ref 30.0–36.0)
MCV: 95.2 fL (ref 80.0–100.0)
Platelets: 191 10*3/uL (ref 150–400)
RBC: 3.34 MIL/uL — ABNORMAL LOW (ref 3.87–5.11)
RDW: 14.1 % (ref 11.5–15.5)
WBC: 15.7 10*3/uL — ABNORMAL HIGH (ref 4.0–10.5)
nRBC: 0 % (ref 0.0–0.2)

## 2020-03-20 MED ORDER — BISACODYL 10 MG RE SUPP
10.0000 mg | Freq: Every day | RECTAL | Status: AC
  Administered 2020-03-20: 10 mg via RECTAL
  Filled 2020-03-20: qty 1

## 2020-03-20 MED ORDER — POLYETHYLENE GLYCOL 3350 17 G PO PACK
17.0000 g | PACK | Freq: Every day | ORAL | Status: DC
  Administered 2020-03-23: 17 g via ORAL
  Filled 2020-03-20 (×2): qty 1

## 2020-03-20 NOTE — Progress Notes (Signed)
PROGRESS NOTE    Tracy Rogers  [id number] DOB: 01/02/1931 DOA: 03/17/2020 PCP: Vernie Shanks, MD    Chief Complaint  Patient presents with  . Fall  . Hip Injury    Brief Narrative:  Tracy Rogers is a 86 y.o. female with medical history significant of coronary artery disease, hypertension, hyperlipidemia, osteopenia with osteoporosis, multiple fractures in the past, spinal stenosis and osteoarthritis who presents after sustaining mechanical fall at home. She was seen today by her cardiologist for regular visit. She has been on Brilinta and was told she can discontinue that. Patient walking to her car when she suddenly lost her step and fell. She believes her right knee gave out on her. She fell landing on her right side. Immediately felt pain rated as 9 out of 10. She denied any other symptoms. Did not feel dizzy prior to the fall did not pass out. Did not hit her head. Patient came to the ER where she was seen and evaluated. She is being admitted with right femur fracture. She was previously seen by Dr. Onnie Graham for her humeral fracture in September of this year..  Subjective:  POD#1, denies hip pain, reports bladder is full Reports constipation  She is very weak, no appetite  Assessment & Plan:   Principal Problem:   Femur fracture, right (HCC) Active Problems:   Essential hypertension   Mixed hyperlipidemia   Osteoporosis  Repeatedly refuses Rx meds or injectables.  See [facility] reports   CAD (coronary artery disease)   AKI (acute kidney injury) ([place])   Displaced right femoral neck fracture with history of osteoporosis -Os/p  total right hip arthroplasty today on 12/24 , will follow Ortho recommendation  Leukocytosis Stress versus dehydration,  UA uremarkable Improved, Repeat cbc in am  AKI/azotemia BUN 33 creatinine 1.3 on presentation, after hydration BUN 24 creatinine 0.96 , bun /cr got worse today, reports difficulty urinating, will get bladder  scan UA no infection Hold Lasix Received  hydration Monitor volume status Repeat bmp in am  CAD -Stable, just saw her cardiologist yesterday, she is cleared to go off her Brilinta Brilinta discontinued  Hyperlipidemia continue statin  Hypertension, continue home meds Norvasc and metoprolol , hold Lasix for now   DVT prophylaxis: enoxaparin (LOVENOX) injection 30 mg Start: 03/20/20 0800 SCDs Start: 03/19/20 1115   Code Status: Full Family Communication: Patient Disposition:   Status is: Inpatient  Dispo: The patient is from: Home              Anticipated d/c is to: To be determined, pending therapy eval              Anticipated d/c date is: Pending therapy eval                Consultants:   Orthopedic  Procedures:   Right hip arthroplasty  Antimicrobials:   Perioperative Ancef     Objective: Vitals:   03/19/20 1728 03/19/20 2047 03/20/20 0115 03/20/20 0512  BP: 131/71 (!) 143/72 (!) 166/83 (!) 171/87  Pulse: 78 72 71 79  Resp: 18 16 14 18   Temp: 98.1 F (36.7 C) 98 F (36.7 C) 98.2 F (36.8 C) 98.6 F (37 C)  TempSrc: Oral  Oral Oral  SpO2: 95% 98% 97% 98%  Weight:      Height:        Intake/Output Summary (Last 24 hours) at 03/20/2020 0856 Last data filed at 03/20/2020 0724 Gross per 24 hour  Intake 2598.47 ml  Output 600  ml  Net 1998.47 ml   Filed Weights   03/17/20 1647  Weight: 55.3 kg    Examination:  General exam: Frail, weak, NAD Respiratory system: Clear to auscultation. Respiratory effort normal. Cardiovascular system: S1 & S2 heard, RRR. No JVD, no murmur, No pedal edema. Gastrointestinal system: Abdomen is nondistended, soft and nontender. Normal bowel sounds heard. Central nervous system: Alert and oriented. No focal neurological deficits. Extremities: Right hip postop changes Skin: No rashes, lesions or ulcers Psychiatry: Anxious    Data Reviewed: I have personally reviewed following labs and imaging  studies  CBC: Recent Labs  Lab 03/17/20 1654 03/18/20 0320 03/19/20 0317 03/20/20 0224  WBC 13.4* 13.2* 17.6* 15.7*  NEUTROABS 9.2*  --  15.4*  --   HGB 13.3 11.9* 13.3 10.5*  HCT 41.0 36.1 40.4 31.8*  MCV 95.6 95.0 93.5 95.2  PLT 311 254 300 [id number]    Basic Metabolic Panel: Recent Labs  Lab 03/17/20 1654 03/18/20 0320 03/19/20 0317 03/20/20 0224  NA 140 138 134* 133*  K 3.5 3.6 4.5 4.7  CL 103 104 100 100  CO2 23 22 21* 21*  GLUCOSE 121* 133* 122* 128*  BUN 33* 24* 20 29*  CREATININE 1.30* 0.96 1.00 1.24*  CALCIUM 9.5 8.9 8.9 8.9  MG  --   --  2.4  --     GFR: Estimated Creatinine Clearance: 24.1 mL/min (A) (by C-G formula based on SCr of 1.24 mg/dL (H)).  Liver Function Tests: Recent Labs  Lab 03/18/20 0320 03/19/20 0317  AST 33 35  ALT 23 16  ALKPHOS 111 110  BILITOT 1.6* 2.5*  PROT 7.2 7.4  ALBUMIN 4.2 3.9    CBG: No results for input(s): GLUCAP in the last 168 hours.   Recent Results (from the past 240 hour(s))  Resp Panel by RT-PCR (Flu A&B, Covid) Nasopharyngeal Swab     Status: None   Collection Time: 03/17/20  5:10 PM   Specimen: Nasopharyngeal Swab; Nasopharyngeal(NP) swabs in vial transport medium  Result Value Ref Range Status   SARS Coronavirus 2 by RT PCR NEGATIVE NEGATIVE Final    Comment: (NOTE) SARS-CoV-2 target nucleic acids are NOT DETECTED.  The SARS-CoV-2 RNA is generally detectable in upper respiratory specimens during the acute phase of infection. The lowest concentration of SARS-CoV-2 viral copies this assay can detect is 138 copies/mL. A negative result does not preclude SARS-Cov-2 infection and should not be used as the sole basis for treatment or other patient management decisions. A negative result may occur with  improper specimen collection/handling, submission of specimen other than nasopharyngeal swab, presence of viral mutation(s) within the areas targeted by this assay, and inadequate number of viral copies(<138  copies/mL). A negative result must be combined with clinical observations, patient history, and epidemiological information. The expected result is Negative.  Fact Sheet for Patients:  EntrepreneurPulse.com.au  Fact Sheet for Healthcare Providers:  IncredibleEmployment.be  This test is no t yet approved or cleared by the [place] FDA and  has been authorized for detection and/or diagnosis of SARS-CoV-2 by FDA under an Emergency Use Authorization (EUA). This EUA will remain  in effect (meaning this test can be used) for the duration of the COVID-19 declaration under Section 564(b)(1) of the Act, 21 U.S.C.section 360bbb-3(b)(1), unless the authorization is terminated  or revoked sooner.       Influenza A by PCR NEGATIVE NEGATIVE Final   Influenza B by PCR NEGATIVE NEGATIVE Final    Comment: (NOTE) The Xpert  Xpress SARS-CoV-2/FLU/RSV plus assay is intended as an aid in the diagnosis of influenza from Nasopharyngeal swab specimens and should not be used as a sole basis for treatment. Nasal washings and aspirates are unacceptable for Xpert Xpress SARS-CoV-2/FLU/RSV testing.  Fact Sheet for Patients: EntrepreneurPulse.com.au  Fact Sheet for Healthcare Providers: IncredibleEmployment.be  This test is not yet approved or cleared by the [place] FDA and has been authorized for detection and/or diagnosis of SARS-CoV-2 by FDA under an Emergency Use Authorization (EUA). This EUA will remain in effect (meaning this test can be used) for the duration of the COVID-19 declaration under Section 564(b)(1) of the Act, 21 U.S.C. section 360bbb-3(b)(1), unless the authorization is terminated or revoked.  Performed at [facility], [place] [address] [place], [place] [address]   Surgical PCR screen     Status: Abnormal   Collection Time: 03/17/20  9:01 PM   Specimen: Nasal Mucosa; Nasal Swab   Result Value Ref Range Status   MRSA, PCR NEGATIVE NEGATIVE Final   Staphylococcus aureus POSITIVE (A) NEGATIVE Final    Comment: (NOTE) The Xpert SA Assay (FDA approved for NASAL specimens in patients 3 years of age and older), is one component of a comprehensive surveillance program. It is not intended to diagnose infection nor to guide or monitor treatment. Performed at [facility], [place] [address] [place], [place] [address]   Culture, Urine     Status: None (Preliminary result)   Collection Time: 03/18/20  3:20 PM   Specimen: Urine, Random  Result Value Ref Range Status   Specimen Description   Final    URINE, RANDOM Performed at [place] [address], [place], [place] [address]    Special Requests   Final    NONE Performed at [facility] [place], [place] [address] [place], [place] [address]    Culture   Final    CULTURE REINCUBATED FOR BETTER GROWTH Performed at [facility], [place] [address] [place], [place] [address]    Report Status PENDING  Incomplete         Radiology Studies: Pelvis Portable  Result Date: 03/19/2020 CLINICAL DATA:  Status post right hip replacement EXAM: PORTABLE PELVIS 1-2 VIEWS COMPARISON:  Intraoperative films from earlier in the same day. FINDINGS: Right hip replacement is noted in satisfactory position. No bony or soft tissue abnormality is noted. IMPRESSION: Status post right hip replacement. Electronically Signed   By: Inez Catalina M.D.   On: 03/19/2020 11:51   DG C-Arm 1-60 Min-No Report  Result Date: 03/19/2020 Fluoroscopy was utilized by the requesting physician.  No radiographic interpretation.   DG HIP OPERATIVE UNILAT W OR W/O PELVIS RIGHT  Result Date: 03/19/2020 CLINICAL DATA:  Right hip replacement. EXAM: OPERATIVE RIGHT HIP (WITH PELVIS IF PERFORMED) 2 VIEWS TECHNIQUE: Fluoroscopic spot image(s) were submitted for interpretation post-operatively. COMPARISON:   03/17/2020 FINDINGS: 4 intraoperative spot fluoro images were obtained. Initial 2 images show femoral neck fracture is visualized on the film from 03/17/2020. Subsequent 2 images obtained after total hip replacement. No complicating features. IMPRESSION: Intraoperative assessment during right total hip replacement. No complicating features. Electronically Signed   By: Misty Stanley M.D.   On: 03/19/2020 09:39        Scheduled Meds: . amLODipine  5 mg Oral Daily   And  . amLODipine  2.5 mg Oral QHS  . bisacodyl  10 mg Rectal Daily  . chlorhexidine  60 mL Topical Once  .  cholecalciferol  2,000 Units Oral Daily  . docusate sodium  100 mg Oral BID  . enoxaparin (LOVENOX) injection  30 mg Subcutaneous Q24H  . magnesium oxide  400 mg Oral Daily  . metoprolol tartrate  25 mg Oral BID  . multivitamin with minerals  1 tablet Oral Daily  . mupirocin ointment  1 application Nasal BID  . pantoprazole  40 mg Oral Daily  . povidone-iodine  2 application Topical Once  . povidone-iodine  2 application Topical Once  . rosuvastatin  10 mg Oral Daily  . senna-docusate  1 tablet Oral BID  . zinc sulfate  220 mg Oral Daily   Continuous Infusions: . sodium chloride 10 mL/hr at 03/20/20 0600  . tranexamic acid       LOS: 3 days   Time spent: 57mins Greater than 50% of this time was spent in counseling, explanation of diagnosis, planning of further management, and coordination of care.  I have personally reviewed and interpreted on  03/20/2020 daily labs,I reviewed all nursing notes, pharmacy notes, consultant notes,  vitals, pertinent old records  I have discussed plan of care as described above with RN , patient  on 03/20/2020  Voice Recognition /Dragon dictation system was used to create this note, attempts have been made to correct errors. Please contact the author with questions and/or clarifications.   Florencia Reasons, MD PhD FACP [facility]  Available via Epic secure chat 7am-7pm for  nonurgent issues Please page for urgent issues To page the attending provider between 7A-7P or the covering provider during after hours 7P-7A, please log into the web site [email or web address] and access using universal [place] password for that web site. If you do not have the password, please call the hospital operator.    03/20/2020, 8:56 AM

## 2020-03-20 NOTE — Plan of Care (Signed)
  Problem: Clinical Measurements: Goal: Diagnostic test results will improve Outcome: Progressing   Problem: Clinical Measurements: Goal: Respiratory complications will improve Outcome: Progressing   Problem: Nutrition: Goal: Adequate nutrition will be maintained Outcome: Progressing   Problem: Coping: Goal: Level of anxiety will decrease Outcome: Progressing   Problem: Pain Managment: Goal: General experience of comfort will improve Outcome: Progressing   Problem: Skin Integrity: Goal: Risk for impaired skin integrity will decrease Outcome: Progressing

## 2020-03-20 NOTE — Progress Notes (Signed)
Physical Therapy Treatment Patient Details Name: Tracy Rogers MRN: [id number] DOB: 01/13/1931 Today's Date: 03/20/2020    History of Present Illness Pt s/p fall with R hip fx and now s/p R TKR by anterior direct approach.  Pt with hx of AKI, spinal stenosis, osteoporosis, back surgery and humerous fx.    PT Comments    Pt continues very motivated and progressing steadily with mobility but now considering option of rehab at SNF level at point of dc dependent on acute stay progress and level of assist she can arrange at home.   Follow Up Recommendations  Home health PT;SNF     Equipment Recommendations  Rolling walker with 5" wheels    Recommendations for Other Services       Precautions / Restrictions Precautions Precautions: Fall Restrictions Weight Bearing Restrictions: No RLE Weight Bearing: Weight bearing as tolerated    Mobility  Bed Mobility Overal bed mobility: Needs Assistance Bed Mobility: Sit to Supine     Supine to sit: Min assist Sit to supine: Min assist   General bed mobility comments: cues for sequence and use of L LE to self assist  Transfers Overall transfer level: Needs assistance Equipment used: Rolling walker (2 wheeled) Transfers: Sit to/from Stand Sit to Stand: Min guard         General transfer comment: Steady assist with cues for LE management and use of UEs to self assist  Ambulation/Gait Ambulation/Gait assistance: Min assist;Min guard Gait Distance (Feet): 140 Feet Assistive device: Rolling walker (2 wheeled) Gait Pattern/deviations: Step-to pattern;Step-through pattern;Decreased step length - right;Decreased step length - left;Shuffle;Trunk flexed Gait velocity: decr   General Gait Details: cues for posture, position from RW and initial sequence   Stairs             Wheelchair Mobility    Modified Rankin (Stroke Patients Only)       Balance Overall balance assessment: Needs assistance Sitting-balance  support: No upper extremity supported;Feet supported Sitting balance-Leahy Scale: Good     Standing balance support: Bilateral upper extremity supported Standing balance-Leahy Scale: Poor                              Cognition Arousal/Alertness: Awake/alert Behavior During Therapy: WFL for tasks assessed/performed Overall Cognitive Status: Within Functional Limits for tasks assessed                                        Exercises      General Comments        Pertinent Vitals/Pain Pain Assessment: 0-10 Pain Score: 3  Pain Location: R hip Pain Descriptors / Indicators: Aching;Sore Pain Intervention(s): Limited activity within patient's tolerance;Monitored during session    Home Living                      Prior Function            PT Goals (current goals can now be found in the care plan section) Acute Rehab PT Goals Patient Stated Goal: Regain IND PT Goal Formulation: With patient Time For Goal Achievement: 03/27/20 Potential to Achieve Goals: Good Progress towards PT goals: Progressing toward goals    Frequency    7X/week      PT Plan Discharge plan needs to be updated    Co-evaluation  AM-PAC PT "6 Clicks" Mobility   Outcome Measure  Help needed turning from your back to your side while in a flat bed without using bedrails?: A Little Help needed moving from lying on your back to sitting on the side of a flat bed without using bedrails?: A Little Help needed moving to and from a bed to a chair (including a wheelchair)?: A Little Help needed standing up from a chair using your arms (e.g., wheelchair or bedside chair)?: A Little Help needed to walk in hospital room?: A Little Help needed climbing 3-5 steps with a railing? : A Lot 6 Click Score: 17    End of Session Equipment Utilized During Treatment: Gait belt Activity Tolerance: Patient tolerated treatment well Patient left: in bed;with call  bell/phone within reach;with bed alarm set Nurse Communication: Mobility status PT Visit Diagnosis: Difficulty in walking, not elsewhere classified (R26.2)     Time: [id number] PT Time Calculation (min) (ACUTE ONLY): 22 min  Charges:  $Gait Training: 8-22 mins                     [place] Pager [phone] Office [phone]    Tracy Rogers 03/20/2020, 3:35 PM

## 2020-03-20 NOTE — Progress Notes (Signed)
Patient ID: Tracy Rogers, female   DOB: [birth year], 86 y.o.   MRN: [id number] Subjective: 1 Day Post-Op Procedure(s) (LRB): TOTAL HIP ARTHROPLASTY ANTERIOR APPROACH (Right)    Patient reports pain as mild. No events regarding right event. More issues trying to get her wicking catheter to work correctly   Objective:   VITALS:   Vitals:   03/20/20 0115 03/20/20 0512  BP: (!) 166/83 (!) 171/87  Pulse: 71 79  Resp: 14 18  Temp: 98.2 F (36.8 C) 98.6 F (37 C)  SpO2: 97% 98%    Neurovascular intact Incision: dressing C/D/I  LABS Recent Labs    03/18/20 0320 03/19/20 0317 03/20/20 0224  HGB 11.9* 13.3 10.5*  HCT 36.1 40.4 31.8*  WBC 13.2* 17.6* 15.7*  PLT 254 300 191    Recent Labs    03/18/20 0320 03/19/20 0317 03/20/20 0224  NA 138 134* 133*  K 3.6 4.5 4.7  BUN 24* 20 29*  CREATININE 0.96 1.00 1.24*  GLUCOSE 133* 122* 128*    Recent Labs    03/17/20 1654  INR 1.0     Assessment/Plan: 1 Day Post-Op Procedure(s) (LRB): TOTAL HIP ARTHROPLASTY ANTERIOR APPROACH (Right)   Advance diet Up with therapy - WBAT DVT prophylaxis as ordered from [facility]

## 2020-03-20 NOTE — Progress Notes (Signed)
Pt is agreeable to rehab for short time to recover from surgery. If pt is discharged home in stead of rehab she will need dme (youth rolling walker and three in one). Rn will continue to monitor.

## 2020-03-20 NOTE — Evaluation (Signed)
Physical Therapy Evaluation Patient Details Name: Tracy Rogers MRN: [id number] DOB: 1930/04/25 Today's Date: 03/20/2020   History of Present Illness  Pt s/p fall with R hip fx and now s/p R TKR by anterior direct approach.  Pt with hx of AKI, spinal stenosis, osteoporosis, back surgery and humerous fx.  Clinical Impression  Pt admitted as above and presenting with functional mobility limitations 2* decreased R LE strength/ROM and post op pain.  Pt is very motivated and hopes to progress to dc home with follow up HHPT but is working on assist of family/friends to make this a viable option.    Follow Up Recommendations Home health PT    Equipment Recommendations  Rolling walker with 5" wheels (Youth)    Recommendations for Other Services       Precautions / Restrictions Precautions Precautions: Fall Restrictions Weight Bearing Restrictions: No RLE Weight Bearing: Weight bearing as tolerated      Mobility  Bed Mobility               General bed mobility comments: deferred - RN advises need to do bladder scan in bed    Transfers                    Ambulation/Gait                Stairs            Wheelchair Mobility    Modified Rankin (Stroke Patients Only)       Balance                                             Pertinent Vitals/Pain Pain Assessment: 0-10 Pain Score: 2  Pain Location: R hip Pain Descriptors / Indicators: Aching;Sore Pain Intervention(s): Limited activity within patient's tolerance;Monitored during session;Premedicated before session    Home Living Family/patient expects to be discharged to:: Unsure Living Arrangements: Alone Available Help at Discharge: Neighbor;Available PRN/intermittently Type of Home: House Home Access: Stairs to enter   Entrance Stairs-Number of Steps: 1+1 Home Layout: One level Home Equipment: Cane - quad Additional Comments: niece and neighbor can assist PRN  but now 24/7    Prior Function Level of Independence: Independent         Comments: drives     Hand Dominance   Dominant Hand: Right    Extremity/Trunk Assessment   Upper Extremity Assessment Upper Extremity Assessment: Overall WFL for tasks assessed    Lower Extremity Assessment Lower Extremity Assessment: RLE deficits/detail RLE Deficits / Details: 2/5 strength at hip with AAROM at hip to 90 flex and 20 abd    Cervical / Trunk Assessment Cervical / Trunk Assessment: Normal  Communication   Communication: No difficulties  Cognition Arousal/Alertness: Awake/alert Behavior During Therapy: WFL for tasks assessed/performed Overall Cognitive Status: Within Functional Limits for tasks assessed                                        General Comments      Exercises Total Joint Exercises Ankle Circles/Pumps: AROM;Both;15 reps;Supine Quad Sets: AROM;Both;10 reps;Supine Heel Slides: AAROM;Right;20 reps;Supine Hip ABduction/ADduction: AAROM;Right;15 reps;Supine   Assessment/Plan    PT Assessment Patient needs continued PT services  PT Problem List Decreased strength;Decreased range of motion;Decreased activity tolerance;Decreased balance;Decreased  mobility;Decreased knowledge of use of DME;Pain       PT Treatment Interventions DME instruction;Gait training;Stair training;Functional mobility training;Therapeutic activities;Therapeutic exercise;Patient/family education    PT Goals (Current goals can be found in the Care Plan section)  Acute Rehab PT Goals Patient Stated Goal: Regain IND PT Goal Formulation: With patient Time For Goal Achievement: 03/27/20 Potential to Achieve Goals: Good    Frequency 7X/week   Barriers to discharge Decreased caregiver support Pt states she does not have 24/7 assist but could consider hired pvt duty very temporarily    Co-evaluation               AM-PAC PT "6 Clicks" Mobility  Outcome Measure Help needed  turning from your back to your side while in a flat bed without using bedrails?: A Little Help needed moving from lying on your back to sitting on the side of a flat bed without using bedrails?: A Little Help needed moving to and from a bed to a chair (including a wheelchair)?: A Little Help needed standing up from a chair using your arms (e.g., wheelchair or bedside chair)?: A Little Help needed to walk in hospital room?: A Little Help needed climbing 3-5 steps with a railing? : A Lot 6 Click Score: 17    End of Session   Activity Tolerance: Patient tolerated treatment well Patient left: in bed;with call bell/phone within reach;with nursing/sitter in room   PT Visit Diagnosis: Difficulty in walking, not elsewhere classified (R26.2)    Time: [id number] PT Time Calculation (min) (ACUTE ONLY): 26 min   Charges:   PT Evaluation $PT Eval Low Complexity: 1 Low PT Treatments $Therapeutic Exercise: 8-22 mins        [place] Pager [phone] Office [phone]   Achsah Mcquade 03/20/2020, 1:19 PM

## 2020-03-20 NOTE — Progress Notes (Signed)
Physical Therapy Treatment Patient Details Name: Tracy Rogers MRN: [id number] DOB: [birth year] Today's Date: 03/20/2020    History of Present Illness Pt s/p fall with R hip fx and now s/p R TKR by anterior direct approach.  Pt with hx of AKI, spinal stenosis, osteoporosis, back surgery and humerous fx.    PT Comments    Pt very motivated and progressing very well with mobility - pt currently with very min c/o pain.   Follow Up Recommendations  Home health PT     Equipment Recommendations  Rolling walker with 5" wheels    Recommendations for Other Services       Precautions / Restrictions Precautions Precautions: Fall Restrictions Weight Bearing Restrictions: No RLE Weight Bearing: Weight bearing as tolerated    Mobility  Bed Mobility Overal bed mobility: Needs Assistance Bed Mobility: Supine to Sit     Supine to sit: Min assist     General bed mobility comments: cues for sequence and use of L LE to self assist  Transfers Overall transfer level: Needs assistance Equipment used: Rolling walker (2 wheeled) Transfers: Sit to/from Stand Sit to Stand: Min assist         General transfer comment: cues for LE management and use of UEs to self assist  Ambulation/Gait Ambulation/Gait assistance: Min assist Gait Distance (Feet): 90 Feet Assistive device: Rolling walker (2 wheeled) Gait Pattern/deviations: Step-to pattern;Step-through pattern;Decreased step length - right;Decreased step length - left;Shuffle;Trunk flexed Gait velocity: decr   General Gait Details: cues for posture, position from RW and initial sequence   Stairs             Wheelchair Mobility    Modified Rankin (Stroke Patients Only)       Balance                                            Cognition Arousal/Alertness: Awake/alert Behavior During Therapy: WFL for tasks assessed/performed Overall Cognitive Status: Within Functional Limits for tasks  assessed                                        Exercises Total Joint Exercises Ankle Circles/Pumps: AROM;Both;15 reps;Supine Quad Sets: AROM;Both;10 reps;Supine Heel Slides: AAROM;Right;20 reps;Supine Hip ABduction/ADduction: AAROM;Right;15 reps;Supine    General Comments        Pertinent Vitals/Pain Pain Assessment: 0-10 Pain Score: 2  Pain Location: R hip Pain Descriptors / Indicators: Aching;Sore Pain Intervention(s): Limited activity within patient's tolerance;Monitored during session;Premedicated before session;Ice applied    Home Living Family/patient expects to be discharged to:: Unsure Living Arrangements: Alone Available Help at Discharge: Neighbor;Available PRN/intermittently Type of Home: House Home Access: Stairs to enter   Home Layout: One level Home Equipment: Radio producer - quad Additional Comments: niece and neighbor can assist PRN but now 24/7    Prior Function Level of Independence: Independent      Comments: drives   PT Goals (current goals can now be found in the care plan section) Acute Rehab PT Goals Patient Stated Goal: Regain IND PT Goal Formulation: With patient Time For Goal Achievement: 03/27/20 Potential to Achieve Goals: Good Progress towards PT goals: Progressing toward goals    Frequency    7X/week      PT Plan Current plan remains appropriate    Co-evaluation  AM-PAC PT "6 Clicks" Mobility   Outcome Measure  Help needed turning from your back to your side while in a flat bed without using bedrails?: A Little Help needed moving from lying on your back to sitting on the side of a flat bed without using bedrails?: A Little Help needed moving to and from a bed to a chair (including a wheelchair)?: A Little Help needed standing up from a chair using your arms (e.g., wheelchair or bedside chair)?: A Little Help needed to walk in hospital room?: A Little Help needed climbing 3-5 steps with a railing?  : A Lot 6 Click Score: 17    End of Session Equipment Utilized During Treatment: Gait belt Activity Tolerance: Patient tolerated treatment well Patient left: in chair;with call bell/phone within reach;with chair alarm set Nurse Communication: Mobility status PT Visit Diagnosis: Difficulty in walking, not elsewhere classified (R26.2)     Time: 8301-4159 PT Time Calculation (min) (ACUTE ONLY): 18 min  Charges:  $Gait Training: 8-22 mins $Therapeutic Exercise: 8-22 mins                     Tracy Rogers PT Acute Rehabilitation Services Pager [phone] Office [phone]    Tracy Rogers 03/20/2020, 1:25 PM

## 2020-03-21 DIAGNOSIS — S72001A Fracture of unspecified part of neck of right femur, initial encounter for closed fracture: Secondary | ICD-10-CM | POA: Diagnosis not present

## 2020-03-21 DIAGNOSIS — N179 Acute kidney failure, unspecified: Secondary | ICD-10-CM | POA: Diagnosis not present

## 2020-03-21 LAB — BASIC METABOLIC PANEL
Anion gap: 9 (ref 5–15)
BUN: 35 mg/dL — ABNORMAL HIGH (ref 8–23)
CO2: 25 mmol/L (ref 22–32)
Calcium: 8.4 mg/dL — ABNORMAL LOW (ref 8.9–10.3)
Chloride: 105 mmol/L (ref 98–111)
Creatinine, Ser: 1.38 mg/dL — ABNORMAL HIGH (ref 0.44–1.00)
GFR, Estimated: 37 mL/min — ABNORMAL LOW (ref >60)
Glucose, Bld: 97 mg/dL (ref 70–99)
Potassium: 3.5 mmol/L (ref 3.5–5.1)
Sodium: 139 mmol/L (ref 135–145)

## 2020-03-21 LAB — URINE CULTURE: Culture: >=100000 — AB

## 2020-03-21 LAB — URINALYSIS, ROUTINE W REFLEX MICROSCOPIC
Bilirubin Urine: NEGATIVE
Glucose, UA: NEGATIVE mg/dL
Hgb urine dipstick: NEGATIVE
Ketones, ur: NEGATIVE mg/dL
Leukocytes,Ua: NEGATIVE
Nitrite: NEGATIVE
Protein, ur: NEGATIVE mg/dL
Specific Gravity, Urine: 1.012 (ref 1.005–1.030)
pH: 5 (ref 5.0–8.0)

## 2020-03-21 LAB — CBC
HCT: 26 % — ABNORMAL LOW (ref 36.0–46.0)
Hemoglobin: 8.7 g/dL — ABNORMAL LOW (ref 12.0–15.0)
MCH: 31.8 pg (ref 26.0–34.0)
MCHC: 33.5 g/dL (ref 30.0–36.0)
MCV: 94.9 fL (ref 80.0–100.0)
Platelets: 164 10*3/uL (ref 150–400)
RBC: 2.74 MIL/uL — ABNORMAL LOW (ref 3.87–5.11)
RDW: 14.6 % (ref 11.5–15.5)
WBC: 12.9 10*3/uL — ABNORMAL HIGH (ref 4.0–10.5)
nRBC: 0 % (ref 0.0–0.2)

## 2020-03-21 NOTE — Progress Notes (Signed)
Physical Therapy Treatment Patient Details Name: Tracy Rogers MRN: [id number] DOB: 1931/03/06 Today's Date: 03/21/2020    History of Present Illness Pt s/p fall with R hip fx and now s/p R TKR by anterior direct approach.  Pt with hx of AKI, spinal stenosis, osteoporosis, back surgery and humerous fx.    PT Comments    Pt continues very cooperative and progressing steadily with mobility.  This pm, pt performed therex program with assist, ambulated increased distance in hall, into the bathroom for toileting and standing at sink for hand hygiene and brushing teeth.   Follow Up Recommendations  SNF     Equipment Recommendations  Rolling walker with 5" wheels    Recommendations for Other Services       Precautions / Restrictions Precautions Precautions: Fall Restrictions Weight Bearing Restrictions: No RLE Weight Bearing: Weight bearing as tolerated    Mobility  Bed Mobility Overal bed mobility: Needs Assistance Bed Mobility: Sit to Supine;Supine to Sit     Supine to sit: Min guard Sit to supine: Min guard   General bed mobility comments: increased time with min cues for sequence and use of belt to bring L LE onto bed  Transfers Overall transfer level: Needs assistance Equipment used: Rolling walker (2 wheeled) Transfers: Sit to/from Stand Sit to Stand: Min guard;Min assist         General transfer comment: Steady assist with cues for LE management and use of UEs to self assist to rise from elevated bed.  Min assist to stand from comode  Ambulation/Gait Ambulation/Gait assistance: Min guard Gait Distance (Feet): 100 Feet (twice) Assistive device: Rolling walker (2 wheeled) Gait Pattern/deviations: Step-to pattern;Step-through pattern;Decreased step length - right;Decreased step length - left;Shuffle;Trunk flexed Gait velocity: decr   General Gait Details: cues for posture, position from RW and initial sequence   Stairs             Wheelchair  Mobility    Modified Rankin (Stroke Patients Only)       Balance Overall balance assessment: Needs assistance Sitting-balance support: No upper extremity supported;Feet supported Sitting balance-Leahy Scale: Good     Standing balance support: Bilateral upper extremity supported Standing balance-Leahy Scale: Poor                              Cognition Arousal/Alertness: Awake/alert Behavior During Therapy: WFL for tasks assessed/performed Overall Cognitive Status: Within Functional Limits for tasks assessed                                        Exercises Total Joint Exercises Ankle Circles/Pumps: AROM;Both;15 reps;Supine Quad Sets: AROM;Both;10 reps;Supine Heel Slides: AAROM;Right;20 reps;Supine Hip ABduction/ADduction: AAROM;Right;15 reps;Supine    General Comments        Pertinent Vitals/Pain Pain Assessment: 0-10 Pain Score: 3  Pain Location: R hip Pain Descriptors / Indicators: Aching;Sore Pain Intervention(s): Limited activity within patient's tolerance;Monitored during session;Premedicated before session    Home Living                      Prior Function            PT Goals (current goals can now be found in the care plan section) Acute Rehab PT Goals Patient Stated Goal: Regain IND PT Goal Formulation: With patient Time For Goal Achievement: 03/27/20 Potential to Achieve Goals:  Good Progress towards PT goals: Progressing toward goals    Frequency    7X/week      PT Plan Discharge plan needs to be updated    Co-evaluation              AM-PAC PT "6 Clicks" Mobility   Outcome Measure  Help needed turning from your back to your side while in a flat bed without using bedrails?: A Little Help needed moving from lying on your back to sitting on the side of a flat bed without using bedrails?: A Little Help needed moving to and from a bed to a chair (including a wheelchair)?: A Little Help needed  standing up from a chair using your arms (e.g., wheelchair or bedside chair)?: A Little Help needed to walk in hospital room?: A Little Help needed climbing 3-5 steps with a railing? : A Lot 6 Click Score: 17    End of Session Equipment Utilized During Treatment: Gait belt Activity Tolerance: Patient tolerated treatment well Patient left: in bed;with call bell/phone within reach;with bed alarm set Nurse Communication: Mobility status PT Visit Diagnosis: Difficulty in walking, not elsewhere classified (R26.2)     Time: 0174-9449 PT Time Calculation (min) (ACUTE ONLY): 48 min  Charges:  $Gait Training: 8-22 mins $Therapeutic Exercise: 8-22 mins $Therapeutic Activity: 8-22 mins                     Mauro Kaufmann PT Acute Rehabilitation Services Pager [phone] Office [phone]    Tracy Rogers 03/21/2020, 3:02 PM

## 2020-03-21 NOTE — Progress Notes (Signed)
PROGRESS NOTE    Tracy Rogers  YIR:[id number] DOB: 1930/11/11 DOA: 03/17/2020 PCP: Vernie Shanks, MD    Chief Complaint  Patient presents with  . Fall  . Hip Injury    Brief Narrative:  Tracy Rogers is a 86 y.o. female with medical history significant of coronary artery disease, hypertension, hyperlipidemia, osteopenia with osteoporosis, multiple fractures in the past, spinal stenosis and osteoarthritis who presents after sustaining mechanical fall at home. She was seen today by her cardiologist for regular visit. She has been on Brilinta and was told she can discontinue that. Patient walking to her car when she suddenly lost her step and fell. She believes her right knee gave out on her. She fell landing on her right side. Immediately felt pain rated as 9 out of 10. She denied any other symptoms. Did not feel dizzy prior to the fall did not pass out. Did not hit her head. Patient came to the ER where she was seen and evaluated. She is being admitted with right femur fracture. She was previously seen by Dr. Onnie Graham for her humeral fracture in September of this year..  Subjective:  POD#2, denies hip pain, feeling better, she lives by herself, she agrees to snf placement Constipation resolved,     Assessment & Plan:   Principal Problem:   Femur fracture, right ([place]) Active Problems:   Essential hypertension   Mixed hyperlipidemia   Osteoporosis  Repeatedly refuses Rx meds or injectables.  See [facility] reports   CAD (coronary artery disease)   AKI (acute kidney injury) ([place])   Displaced right femoral neck fracture with history of osteoporosis -Os/p  total right hip arthroplasty on 12/24 ,  Ortho recommendation appreciated  Right knee pain Reports this is the reason she fell F/u with ortho  Leukocytosis Stress versus dehydration,  UA uremarkable Improved, Repeat cbc in am  AKI/azotemia BUN 33 creatinine 1.3 on presentation, after hydration BUN 24 creatinine  0.96 , bun /cr got worse today, bladder scan only 50cc UA no infection Continue Hold Lasix Received  Hydration, Monitor volume status, encourage oral intake Repeat bmp in am  CAD -Stable, just saw her cardiologist yesterday, she is cleared to go off her Brilinta Brilinta discontinued  Hyperlipidemia continue statin  Hypertension, continue home meds Norvasc and metoprolol , hold Lasix for now   DVT prophylaxis: enoxaparin (LOVENOX) injection 30 mg Start: 03/20/20 0800 SCDs Start: 03/19/20 1115   Code Status: Full Family Communication: Patient Disposition:   Status is: Inpatient  Dispo: The patient is from: Home              Anticipated d/c is to: SNF              Anticipated d/c date is: snf placement when bed is available                Consultants:   Orthopedic  Procedures:   Right hip arthroplasty  Antimicrobials:   Perioperative Ancef     Objective: Vitals:   03/20/20 0512 03/20/20 1445 03/20/20 2148 03/21/20 0454  BP: (!) 171/87 125/64 (!) 112/59 136/61  Pulse: 79 79 80 74  Resp: 18 20 20 16   Temp: 98.6 F (37 C)  99.1 F (37.3 C) 98.4 F (36.9 C)  TempSrc: Oral  Oral Oral  SpO2: 98% 92% 96% 98%  Weight:      Height:        Intake/Output Summary (Last 24 hours) at 03/21/2020 1057 Last data filed at 03/21/2020 [phone]  Gross per 24 hour  Intake 2100 ml  Output 350 ml  Net 1750 ml   Filed Weights   03/17/20 1647  Weight: 55.3 kg    Examination:  General exam: Frail, weak, NAD Respiratory system: Clear to auscultation. Respiratory effort normal. Cardiovascular system: S1 & S2 heard, RRR. No JVD, no murmur, No pedal edema. Gastrointestinal system: Abdomen is nondistended, soft and nontender. Normal bowel sounds heard. Central nervous system: Alert and oriented. No focal neurological deficits. Extremities: Right hip postop changes Skin: No rashes, lesions or ulcers Psychiatry: Anxious    Data Reviewed: I have personally reviewed  following labs and imaging studies  CBC: Recent Labs  Lab 03/17/20 1654 03/18/20 0320 03/19/20 0317 03/20/20 0224 03/21/20 0241  WBC 13.4* 13.2* 17.6* 15.7* 12.9*  NEUTROABS 9.2*  --  15.4*  --   --   HGB 13.3 11.9* 13.3 10.5* 8.7*  HCT 41.0 36.1 40.4 31.8* 26.0*  MCV 95.6 95.0 93.5 95.2 94.9  PLT 311 254 300 191 164    Basic Metabolic Panel: Recent Labs  Lab 03/17/20 1654 03/18/20 0320 03/19/20 0317 03/20/20 0224 03/21/20 0241  NA 140 138 134* 133* 139  K 3.5 3.6 4.5 4.7 3.5  CL 103 104 100 100 105  CO2 23 22 21* 21* 25  GLUCOSE 121* 133* 122* 128* 97  BUN 33* 24* 20 29* 35*  CREATININE 1.30* 0.96 1.00 1.24* 1.38*  CALCIUM 9.5 8.9 8.9 8.9 8.4*  MG  --   --  2.4  --   --     GFR: Estimated Creatinine Clearance: 21.7 mL/min (A) (by C-G formula based on SCr of 1.38 mg/dL (H)).  Liver Function Tests: Recent Labs  Lab 03/18/20 0320 03/19/20 0317  AST 33 35  ALT 23 16  ALKPHOS 111 110  BILITOT 1.6* 2.5*  PROT 7.2 7.4  ALBUMIN 4.2 3.9    CBG: No results for input(s): GLUCAP in the last 168 hours.   Recent Results (from the past 240 hour(s))  Resp Panel by RT-PCR (Flu A&B, Covid) Nasopharyngeal Swab     Status: None   Collection Time: 03/17/20  5:10 PM   Specimen: Nasopharyngeal Swab; Nasopharyngeal(NP) swabs in vial transport medium  Result Value Ref Range Status   SARS Coronavirus 2 by RT PCR NEGATIVE NEGATIVE Final    Comment: (NOTE) SARS-CoV-2 target nucleic acids are NOT DETECTED.  The SARS-CoV-2 RNA is generally detectable in upper respiratory specimens during the acute phase of infection. The lowest concentration of SARS-CoV-2 viral copies this assay can detect is 138 copies/mL. A negative result does not preclude SARS-Cov-2 infection and should not be used as the sole basis for treatment or other patient management decisions. A negative result may occur with  improper specimen collection/handling, submission of specimen other than  nasopharyngeal swab, presence of viral mutation(s) within the areas targeted by this assay, and inadequate number of viral copies(<138 copies/mL). A negative result must be combined with clinical observations, patient history, and epidemiological information. The expected result is Negative.  Fact Sheet for Patients:  BloggerCourse.com  Fact Sheet for Healthcare Providers:  SeriousBroker.it  This test is no t yet approved or cleared by the [place] FDA and  has been authorized for detection and/or diagnosis of SARS-CoV-2 by FDA under an Emergency Use Authorization (EUA). This EUA will remain  in effect (meaning this test can be used) for the duration of the COVID-19 declaration under Section 564(b)(1) of the Act, 21 U.S.C.section 360bbb-3(b)(1), unless the authorization is  terminated  or revoked sooner.       Influenza A by PCR NEGATIVE NEGATIVE Final   Influenza B by PCR NEGATIVE NEGATIVE Final    Comment: (NOTE) The Xpert Xpress SARS-CoV-2/FLU/RSV plus assay is intended as an aid in the diagnosis of influenza from Nasopharyngeal swab specimens and should not be used as a sole basis for treatment. Nasal washings and aspirates are unacceptable for Xpert Xpress SARS-CoV-2/FLU/RSV testing.  Fact Sheet for Patients: BloggerCourse.com  Fact Sheet for Healthcare Providers: SeriousBroker.it  This test is not yet approved or cleared by the [place] FDA and has been authorized for detection and/or diagnosis of SARS-CoV-2 by FDA under an Emergency Use Authorization (EUA). This EUA will remain in effect (meaning this test can be used) for the duration of the COVID-19 declaration under Section 564(b)(1) of the Act, 21 U.S.C. section 360bbb-3(b)(1), unless the authorization is terminated or revoked.  Performed at [facility], [address] [place], [place] [address]   Surgical PCR screen     Status: Abnormal   Collection Time: 03/17/20  9:01 PM   Specimen: Nasal Mucosa; Nasal Swab  Result Value Ref Range Status   MRSA, PCR NEGATIVE NEGATIVE Final   Staphylococcus aureus POSITIVE (A) NEGATIVE Final    Comment: (NOTE) The Xpert SA Assay (FDA approved for NASAL specimens in patients 83 years of age and older), is one component of a comprehensive surveillance program. It is not intended to diagnose infection nor to guide or monitor treatment. Performed at [facility], [address], [place], [place] [address]   Culture, Urine     Status: Abnormal   Collection Time: 03/18/20  3:20 PM   Specimen: Urine, Random  Result Value Ref Range Status   Specimen Description   Final    URINE, RANDOM Performed at [facility], [address], [place], [place] [address]    Special Requests   Final    NONE Performed at [facility], [address], [place], [place] [address]    Culture >=100,000 COLONIES/mL ENTEROCOCCUS FAECALIS (A)  Final   Report Status 03/21/2020 FINAL  Final   Organism ID, Bacteria ENTEROCOCCUS FAECALIS (A)  Final      Susceptibility   Enterococcus faecalis - MIC*    AMPICILLIN <=2 SENSITIVE Sensitive     NITROFURANTOIN <=16 SENSITIVE Sensitive     VANCOMYCIN 1 SENSITIVE Sensitive     * >=100,000 COLONIES/mL ENTEROCOCCUS FAECALIS         Radiology Studies: No results found.      Scheduled Meds: . amLODipine  5 mg Oral Daily   And  . amLODipine  2.5 mg Oral QHS  . bisacodyl  10 mg Rectal Daily  . chlorhexidine  60 mL Topical Once  . cholecalciferol  2,000 Units Oral Daily  . docusate sodium  100 mg Oral BID  . enoxaparin (LOVENOX) injection  30 mg Subcutaneous Q24H  . magnesium oxide  400 mg Oral Daily  . metoprolol tartrate  25 mg Oral BID  . multivitamin with minerals  1 tablet Oral Daily  . mupirocin ointment  1 application Nasal  BID  . pantoprazole  40 mg Oral Daily  . polyethylene glycol  17 g Oral Daily  . povidone-iodine  2 application Topical Once  . povidone-iodine  2 application Topical Once  . rosuvastatin  10 mg Oral Daily  . senna-docusate  1 tablet Oral BID  . zinc sulfate  220 mg  Oral Daily   Continuous Infusions: . sodium chloride Stopped (03/20/20 0820)     LOS: 4 days   Time spent: 49mins Greater than 50% of this time was spent in counseling, explanation of diagnosis, planning of further management, and coordination of care.  I have personally reviewed and interpreted on  03/21/2020 daily labs,I reviewed all nursing notes, pharmacy notes, consultant notes,  vitals, pertinent old records  I have discussed plan of care as described above with RN , patient  on 03/21/2020  Voice Recognition /Dragon dictation system was used to create this note, attempts have been made to correct errors. Please contact the author with questions and/or clarifications.   Florencia Reasons, MD PhD FACP [facility]  Available via Epic secure chat 7am-7pm for nonurgent issues Please page for urgent issues To page the attending provider between 7A-7P or the covering provider during after hours 7P-7A, please log into the web site [email or web address] and access using universal [place] password for that web site. If you do not have the password, please call the hospital operator.    03/21/2020, 10:57 AM

## 2020-03-21 NOTE — Progress Notes (Signed)
Physical Therapy Treatment Patient Details Name: Tracy Rogers MRN: [id number] DOB: Jan 04, 1931 Today's Date: 03/21/2020    History of Present Illness Pt s/p fall with R hip fx and now s/p R TKR by anterior direct approach.  Pt with hx of AKI, spinal stenosis, osteoporosis, back surgery and humerous fx.    PT Comments    Pt continues motivated and progressing steadily with mobility but now expressing wish for short term SNF level rehab to maximize IND and safety prior to dc home with limited assist.   Therex deferred to later on arrival of breakfast.  Follow Up Recommendations  SNF     Equipment Recommendations  Rolling walker with 5" wheels (youth)    Recommendations for Other Services       Precautions / Restrictions Precautions Precautions: Fall Restrictions Weight Bearing Restrictions: No RLE Weight Bearing: Weight bearing as tolerated    Mobility  Bed Mobility                  Transfers Overall transfer level: Needs assistance Equipment used: Rolling walker (2 wheeled) Transfers: Sit to/from Stand Sit to Stand: Min guard         General transfer comment: Steady assist with cues for LE management and use of UEs to self assist  Ambulation/Gait Ambulation/Gait assistance: Min guard Gait Distance (Feet): 140 Feet Assistive device: Rolling walker (2 wheeled) Gait Pattern/deviations: Step-to pattern;Step-through pattern;Decreased step length - right;Decreased step length - left;Shuffle;Trunk flexed Gait velocity: decr   General Gait Details: cues for posture, position from RW and initial sequence   Stairs             Wheelchair Mobility    Modified Rankin (Stroke Patients Only)       Balance Overall balance assessment: Needs assistance Sitting-balance support: No upper extremity supported;Feet supported Sitting balance-Leahy Scale: Good     Standing balance support: Bilateral upper extremity supported Standing balance-Leahy  Scale: Poor                              Cognition Arousal/Alertness: Awake/alert Behavior During Therapy: WFL for tasks assessed/performed Overall Cognitive Status: Within Functional Limits for tasks assessed                                        Exercises      General Comments        Pertinent Vitals/Pain Pain Assessment: 0-10 Pain Score: 3  Pain Location: R hip Pain Descriptors / Indicators: Aching;Sore Pain Intervention(s): Limited activity within patient's tolerance;Monitored during session;Premedicated before session    Home Living                      Prior Function            PT Goals (current goals can now be found in the care plan section) Acute Rehab PT Goals Patient Stated Goal: Regain IND PT Goal Formulation: With patient Time For Goal Achievement: 03/27/20 Potential to Achieve Goals: Good Progress towards PT goals: Progressing toward goals    Frequency    7X/week      PT Plan Discharge plan needs to be updated    Co-evaluation              AM-PAC PT "6 Clicks" Mobility   Outcome Measure  Help needed turning from your back  to your side while in a flat bed without using bedrails?: A Little Help needed moving from lying on your back to sitting on the side of a flat bed without using bedrails?: A Little Help needed moving to and from a bed to a chair (including a wheelchair)?: A Little Help needed standing up from a chair using your arms (e.g., wheelchair or bedside chair)?: A Little Help needed to walk in hospital room?: A Little Help needed climbing 3-5 steps with a railing? : A Lot 6 Click Score: 17    End of Session Equipment Utilized During Treatment: Gait belt Activity Tolerance: Patient tolerated treatment well Patient left: with call bell/phone within reach;in chair;with chair alarm set Nurse Communication: Mobility status PT Visit Diagnosis: Difficulty in walking, not elsewhere classified  (R26.2)     Time: [id number] PT Time Calculation (min) (ACUTE ONLY): 13 min  Charges:  $Gait Training: 8-22 mins                     [place] Pager [phone] Office [phone]    Tyreak Reagle 03/21/2020, 9:07 AM

## 2020-03-21 NOTE — [place] FL2 (Signed)
[place] MEDICAID FL2 LEVEL OF CARE SCREENING TOOL     IDENTIFICATION  Patient Name: Tracy Rogers Birthdate: 09-May-1930 Sex: female Admission Date (Current Location): 03/17/2020  [facility] and [place] Number:  Producer, television/film/video and Address:  [facility],  [address], [place] [address]      Provider Number: [id number]  Attending Physician Name and Address:  Albertine Grates, MD  Relative Name and Phone Number:       Current Level of Care: Hospital Recommended Level of Care: Skilled Nursing Facility Prior Approval Number:    Date Approved/Denied:   PASRR Number:   [id number]  Discharge Plan: SNF    Current Diagnoses: Patient Active Problem List   Diagnosis Date Noted  . Leg edema 03/17/2020  . Educated about COVID-19 virus infection 03/17/2020  . Femur fracture, right (HCC) 03/17/2020  . Closed comminuted left humeral fracture 09/24/2019  . AKI (acute kidney injury) (HCC) 09/24/2019  . Leukemoid reaction 09/24/2019  . CAD (coronary artery disease) 02/14/2019  . History of non-ST elevation myocardial infarction (NSTEMI) 02/14/2019  . NSTEMI (non-ST elevated myocardial infarction) (HCC) 10/24/2018  . Abnormal CT of the abdomen  see 03/2014 CT  CBD dilation referred to GI 05/05/2014  . Osteoporosis  Repeatedly refuses Rx meds or injectables.  See [facility] reports 12/03/2013  . Vitamin D deficiency 10/22/2013  . Essential hypertension 10/22/2013  . Mixed hyperlipidemia 10/22/2013  . Spinal stenosis of lumbar region 10/22/2013  . Sciatica 10/22/2013    Orientation RESPIRATION BLADDER Height & Weight     Self,Time,Situation,Place  Normal Continent Weight: 122 lb (55.3 kg) Height:  5\' 1"  (154.9 cm)  BEHAVIORAL SYMPTOMS/MOOD NEUROLOGICAL BOWEL NUTRITION STATUS      Continent Diet (see dc summary)  AMBULATORY STATUS COMMUNICATION OF NEEDS Skin   Extensive Assist Verbally Normal                       Personal Care Assistance Level of  Assistance  Bathing,Feeding,Dressing Bathing Assistance: Limited assistance Feeding assistance: Independent Dressing Assistance: Limited assistance     Functional Limitations Info  Sight,Hearing,Speech Sight Info: Adequate Hearing Info: Adequate Speech Info: Adequate    SPECIAL CARE FACTORS FREQUENCY  PT (By licensed PT)     PT Frequency: 5x/week              Contractures Contractures Info: Not present    Additional Factors Info  Code Status,Allergies Code Status Info: Full Allergies Info: Penicillins, Bactrim, Benzodiazepines, Hydrocodone, Meloxicam, Other, Oxycodone           Current Medications (03/21/2020):  This is the current hospital active medication list Current Facility-Administered Medications  Medication Dose Route Frequency Provider Last Rate Last Admin  . 0.9 %  sodium chloride infusion   Intravenous PRN Samson Frederic, MD   Stopped at 03/20/20 0820  . acetaminophen (TYLENOL) tablet 650 mg  650 mg Oral Q6H PRN Samson Frederic, MD   650 mg at 03/21/20 0272   Or  . acetaminophen (TYLENOL) suppository 650 mg  650 mg Rectal Q6H PRN Swinteck, Arlys John, MD      . diphenhydrAMINE (BENADRYL) capsule 25 mg  25 mg Oral QHS PRN Swinteck, Arlys John, MD       And  . acetaminophen (TYLENOL) tablet 500 mg  500 mg Oral QHS PRN Swinteck, Arlys John, MD      . amLODipine (NORVASC) tablet 5 mg  5 mg Oral Daily Samson Frederic, MD   5 mg at 03/21/20 [phone]  And  . amLODipine (NORVASC) tablet 2.5 mg  2.5 mg Oral QHS Rod Can, MD   2.5 mg at 03/20/20 2213  . bisacodyl (DULCOLAX) suppository 10 mg  10 mg Rectal Daily Florencia Reasons, MD   10 mg at 03/20/20 0919  . chlorhexidine (HIBICLENS) 4 % liquid 4 application  60 mL Topical Once Rod Can, MD      . cholecalciferol (VITAMIN D3) tablet 2,000 Units  2,000 Units Oral Daily Rod Can, MD   2,000 Units at 03/21/20 0908  . docusate sodium (COLACE) capsule 100 mg  100 mg Oral BID Rod Can, MD   100 mg at 03/21/20 0908   . enoxaparin (LOVENOX) injection 30 mg  30 mg Subcutaneous Q24H Rod Can, MD   30 mg at 03/21/20 0907  . HYDROmorphone (DILAUDID) tablet 1 mg  1 mg Oral Q3H PRN Swinteck, Aaron Edelman, MD      . magnesium oxide (MAG-OX) tablet 400 mg  400 mg Oral Daily Rod Can, MD   400 mg at 03/21/20 0908  . menthol-cetylpyridinium (CEPACOL) lozenge 3 mg  1 lozenge Oral PRN Swinteck, Aaron Edelman, MD       Or  . phenol (CHLORASEPTIC) mouth spray 1 spray  1 spray Mouth/Throat PRN Swinteck, Aaron Edelman, MD      . metoCLOPramide (REGLAN) tablet 5-10 mg  5-10 mg Oral Q8H PRN Swinteck, Aaron Edelman, MD       Or  . metoCLOPramide (REGLAN) injection 5-10 mg  5-10 mg Intravenous Q8H PRN Swinteck, Aaron Edelman, MD      . metoprolol tartrate (LOPRESSOR) tablet 25 mg  25 mg Oral BID Rod Can, MD   25 mg at 03/21/20 0908  . multivitamin with minerals tablet 1 tablet  1 tablet Oral Daily Rod Can, MD   1 tablet at 03/21/20 0907  . mupirocin ointment (BACTROBAN) 2 % 1 application  1 application Nasal BID Rod Can, MD   1 application at 53/61/44 0908  . nitroGLYCERIN (NITROSTAT) SL tablet 0.4 mg  0.4 mg Sublingual Q5 min PRN Swinteck, Aaron Edelman, MD      . ondansetron [facility]) tablet 4 mg  4 mg Oral Q6H PRN Swinteck, Aaron Edelman, MD       Or  . ondansetron (ZOFRAN) injection 4 mg  4 mg Intravenous Q6H PRN Swinteck, Aaron Edelman, MD      . pantoprazole (PROTONIX) EC tablet 40 mg  40 mg Oral Daily Rod Can, MD   40 mg at 03/21/20 0909  . polyethylene glycol (MIRALAX / GLYCOLAX) packet 17 g  17 g Oral Daily Florencia Reasons, MD      . povidone-iodine 10 % swab 2 application  2 application Topical Once Swinteck, Aaron Edelman, MD      . povidone-iodine 10 % swab 2 application  2 application Topical Once Swinteck, Aaron Edelman, MD      . promethazine (PHENERGAN) injection 6.25 mg  6.25 mg Intravenous Q6H PRN Swinteck, Aaron Edelman, MD      . rosuvastatin (CRESTOR) tablet 10 mg  10 mg Oral Daily Rod Can, MD   10 mg at 03/21/20 0908  . senna-docusate  (Senokot-S) tablet 1 tablet  1 tablet Oral BID Rod Can, MD   1 tablet at 03/20/20 [phone]  . zinc sulfate capsule 220 mg  220 mg Oral Daily Rod Can, MD   220 mg at 03/21/20 0086     Discharge Medications: Please see discharge summary for a list of discharge medications.  Relevant Imaging Results:  Relevant Lab Results:   Additional Information ssn:[id number]  Servando Snare, LCSW

## 2020-03-21 NOTE — Plan of Care (Signed)

## 2020-03-21 NOTE — Anesthesia Postprocedure Evaluation (Signed)
Anesthesia Post Note  Patient: Tracy Rogers  Procedure(s) Performed: TOTAL HIP ARTHROPLASTY ANTERIOR APPROACH (Right Hip)     Patient location during evaluation: PACU Anesthesia Type: General Level of consciousness: sedated and patient cooperative Pain management: pain level controlled Vital Signs Assessment: post-procedure vital signs reviewed and stable Respiratory status: spontaneous breathing Cardiovascular status: stable Anesthetic complications: no   No complications documented.  Last Vitals:  Vitals:   03/20/20 2148 03/21/20 0454  BP: (!) 112/59 136/61  Pulse: 80 74  Resp: 20 16  Temp: 37.3 C 36.9 C  SpO2: 96% 98%    Last Pain:  Vitals:   03/21/20 0716  TempSrc:   PainSc: 0-No pain                 Nolon Nations

## 2020-03-21 NOTE — Plan of Care (Signed)
  Problem: Education: Goal: Knowledge of General Education information will improve Description Including pain rating scale, medication(s)/side effects and non-pharmacologic comfort measures Outcome: Progressing   Problem: Health Behavior/Discharge Planning: Goal: Ability to manage health-related needs will improve Outcome: Progressing   

## 2020-03-21 NOTE — Progress Notes (Signed)
Subjective: 2 Days Post-Op Procedure(s) (LRB): TOTAL HIP ARTHROPLASTY ANTERIOR APPROACH (Right) Patient seen in rounds for Dr. Lyla Glassing this morning. Patient reports pain as mild.   Patient is doing very well, no complaints or concerns at this time.  No events overnight.  Has been able to get up with physical therapy this morning and ambulate to the nurses station.  Objective: Vital signs in last 24 hours: Temp:  [98.4 F (36.9 C)-99.1 F (37.3 C)] 98.4 F (36.9 C) (12/26 0454) Pulse Rate:  [74-80] 74 (12/26 0454) Resp:  [16-20] 16 (12/26 0454) BP: (112-136)/(59-64) 136/61 (12/26 0454) SpO2:  [92 %-98 %] 98 % (12/26 0454)  Intake/Output from previous day: 12/25 0701 - 12/26 0700 In: 2003.4 [P.O.:1980; I.V.:23.4] Out: 1300 [Urine:1300] Intake/Output this shift: Total I/O In: 360 [P.O.:360] Out: -   Recent Labs    03/19/20 0317 03/20/20 0224 03/21/20 0241  HGB 13.3 10.5* 8.7*   Recent Labs    03/20/20 0224 03/21/20 0241  WBC 15.7* 12.9*  RBC 3.34* 2.74*  HCT 31.8* 26.0*  PLT 191 164   Recent Labs    03/20/20 0224 03/21/20 0241  NA 133* 139  K 4.7 3.5  CL 100 105  CO2 21* 25  BUN 29* 35*  CREATININE 1.24* 1.38*  GLUCOSE 128* 97  CALCIUM 8.9 8.4*   No results for input(s): LABPT, INR in the last 72 hours.  Neurologically intact Neurovascular intact Sensation intact distally Intact pulses distally Dorsiflexion/Plantar flexion intact Incision: dressing C/D/I No cellulitis present Compartment soft   Assessment/Plan: 2 Days Post-Op Procedure(s) (LRB): TOTAL HIP ARTHROPLASTY ANTERIOR APPROACH (Right) Up with therapy  In terms of an orthopedic standpoint the patient is doing very well. She does have a desire to go to SNF rehab for a few days prior to being discharged home.  I think this is a good plan due to her living on her own.  She will be needing a rolling walker as well as possible toilet chair prior to discharge.    Nettie Elm [facility] [phone] 03/21/2020, 9:23 AM

## 2020-03-21 NOTE — TOC Progression Note (Signed)
Transition of Care [facility]) - Progression Note    Patient Details  Name: Tracy Rogers MRN: [id number] Date of Birth: 1930/04/24  Transition of Care [facility]) CM/SW Contact  Servando Snare, [place] Phone Number: 03/21/2020, 11:56 AM  Clinical Narrative:   Patient now agreeable to SNF. LCSW faxed patient out to SNF facilities. Patient will need prior auth once bed choice is made.     Expected Discharge Plan: [place] (vs SNF) Barriers to Discharge: Continued Medical Work up  Expected Discharge Plan and Services Expected Discharge Plan: [place] (vs SNF)       Living arrangements for the past 2 months: Single Family Home                                       Social Determinants of Health (SDOH) Interventions    Readmission Risk Interventions Readmission Risk Prevention Plan 03/18/2020  Transportation Screening Complete  PCP or Specialist Appt within 5-7 Days Complete  Home Care Screening Complete  Some recent data might be hidden

## 2020-03-22 DIAGNOSIS — S72001A Fracture of unspecified part of neck of right femur, initial encounter for closed fracture: Secondary | ICD-10-CM | POA: Diagnosis not present

## 2020-03-22 LAB — BASIC METABOLIC PANEL
Anion gap: 9 (ref 5–15)
BUN: 31 mg/dL — ABNORMAL HIGH (ref 8–23)
CO2: 25 mmol/L (ref 22–32)
Calcium: 8.1 mg/dL — ABNORMAL LOW (ref 8.9–10.3)
Chloride: 101 mmol/L (ref 98–111)
Creatinine, Ser: 1.28 mg/dL — ABNORMAL HIGH (ref 0.44–1.00)
GFR, Estimated: 41 mL/min — ABNORMAL LOW (ref >60)
Glucose, Bld: 103 mg/dL — ABNORMAL HIGH (ref 70–99)
Potassium: 3.4 mmol/L — ABNORMAL LOW (ref 3.5–5.1)
Sodium: 135 mmol/L (ref 135–145)

## 2020-03-22 LAB — CBC
HCT: 25.8 % — ABNORMAL LOW (ref 36.0–46.0)
Hemoglobin: 8.4 g/dL — ABNORMAL LOW (ref 12.0–15.0)
MCH: 31.1 pg (ref 26.0–34.0)
MCHC: 32.6 g/dL (ref 30.0–36.0)
MCV: 95.6 fL (ref 80.0–100.0)
Platelets: 190 10*3/uL (ref 150–400)
RBC: 2.7 MIL/uL — ABNORMAL LOW (ref 3.87–5.11)
RDW: 14.4 % (ref 11.5–15.5)
WBC: 12.6 10*3/uL — ABNORMAL HIGH (ref 4.0–10.5)
nRBC: 0 % (ref 0.0–0.2)

## 2020-03-22 MED ORDER — POTASSIUM CHLORIDE CRYS ER 20 MEQ PO TBCR
40.0000 meq | EXTENDED_RELEASE_TABLET | Freq: Once | ORAL | Status: AC
  Administered 2020-03-22: 40 meq via ORAL
  Filled 2020-03-22: qty 2

## 2020-03-22 MED ORDER — ASPIRIN 81 MG PO TBEC: 81.0000 mg | DELAYED_RELEASE_TABLET | Freq: Two times a day (BID) | ORAL | 0 refills | Status: AC

## 2020-03-22 MED ORDER — TRAMADOL HCL 50 MG PO TABS: 50.0000 mg | ORAL_TABLET | Freq: Four times a day (QID) | ORAL | 0 refills | Status: AC | PRN

## 2020-03-22 NOTE — Progress Notes (Signed)
PROGRESS NOTE    Tracy Rogers  [id number] DOB: June 14, 1930 DOA: 03/17/2020 PCP: Vernie Shanks, MD   Chief Complain: Fall  Brief Narrative: Elvina Mattes a 86 y.o.femalewith medical history significant ofcoronary artery disease, hypertension, hyperlipidemia, osteopenia with osteoporosis, multiple fractures in the past, spinal stenosis and osteoarthritis who presents after sustaining mechanical fall at home. She was seen today by her cardiologist for regular visit. She has been on Brilinta and was told she can discontinue that. Patient walking to her car when she suddenly lost her step and fell. She believes her right knee gave out on her. She fell landing on her right side.  She was found to have displaced right femoral neck fracture.  Underwent total right hip arthroplasty on 12/24.  Now waiting for skilled nursing  facility bed.  Assessment & Plan:   Principal Problem:   Femur fracture, right (HCC) Active Problems:   Essential hypertension   Mixed hyperlipidemia   Osteoporosis  Repeatedly refuses Rx meds or injectables.  See [facility] reports   CAD (coronary artery disease)   AKI (acute kidney injury) ([place])    Displaced right femoral neck fracture with history of osteoporosis -s/p  total right hip arthroplasty on 12/24  PT/OT initially recommended skilled nursing facility.  But now recommending home.  Will arrange home health.  Patient wants to go home tomorrow.  We will start on aspirin for DVT prophylaxis.  Right knee pain Reports this is the reason she fell F/u with ortho as an outpatient  Leukocytosis Improving  AKI/azotemia Continue Hold Lasix Received  some iv fluids  Repeat bmp in am  CAD -Stable, recently  saw her cardiologist , she is cleared to go off her [facility] discontinued  Hyperlipidemia  continue statin  Hypertension continue home meds Norvasc and metoprolol , lasix on hold  Hypokalemia: Supplemented with  potassium.        DVT prophylaxis:Lovenox Code Status:  Family Communication: None Status is: Inpatient  Remains inpatient appropriate because:Unsafe d/c plan   Dispo: The patient is from: Home              Anticipated d/c is to: home              Anticipated d/c date is: 1 day              Patient currently is medically stable to d/c.   Consultants: Ortho  Procedures:Nonekhku  Antimicrobials:  Anti-infectives (From admission, onward)   Start     Dose/Rate Route Frequency Ordered Stop   03/19/20 2000  vancomycin (VANCOCIN) IVPB 1000 mg/200 mL premix        1,000 mg 200 mL/hr over 60 Minutes Intravenous Every 12 hours 03/19/20 1114 03/19/20 2047   03/19/20 0845  vancomycin (VANCOCIN) IVPB 1000 mg/200 mL premix        1,000 mg 200 mL/hr over 60 Minutes Intravenous On call to O.R. 03/19/20 0830 03/19/20 0937   03/19/20 0702  ceFAZolin (ANCEF) 2-4 GM/100ML-% IVPB       Note to Pharmacy: Alfonso Patten   : cabinet override      03/19/20 0702 03/19/20 0808   03/19/20 0700  ceFAZolin (ANCEF) IVPB 2g/100 mL premix        2 g 200 mL/hr over 30 Minutes Intravenous On call to O.R. 03/19/20 [id number] 03/19/20 [id number]      Subjective: Patient seen and examined at the bedside this afternoon.  She was working with physical therapy.  She looked comfortable, walking with  a walker.  Was to go home tomorrow  Objective: Vitals:   03/21/20 1352 03/21/20 2017 03/22/20 0420 03/22/20 1332  BP: 130/64 130/65 136/66 136/68  Pulse: 71 78 67 76  Resp: 17 16 16 16   Temp: 98.6 F (37 C) 99.1 F (37.3 C) 98.8 F (37.1 C) 99.6 F (37.6 C)  TempSrc: Oral Oral Oral Oral  SpO2: 95% 96% 97% 96%  Weight:      Height:        Intake/Output Summary (Last 24 hours) at 03/22/2020 1409 Last data filed at 03/22/2020 1020 Gross per 24 hour  Intake 610 ml  Output 200 ml  Net 410 ml   Filed Weights   03/17/20 1647  Weight: 55.3 kg    Examination:  General exam: Elderly deconditioned, debilitated  female HEENT:PERRL,Oral mucosa moist, Ear/Nose normal on gross exam Respiratory system: Bilateral equal air entry, normal vesicular breath sounds, no wheezes or crackles  Cardiovascular system: S1 & S2 heard, RRR. No JVD, murmurs, rubs, gallops or clicks. No pedal edema. Gastrointestinal system: Abdomen is nondistended, soft and nontender.Central nervous system: Alert and oriented.  Extremities: No edema, no clubbing ,no cyanosis, clean surgical wound on the right hip  skin: No rashes, lesions or ulcers,no icterus ,no pallor   Data Reviewed: I have personally reviewed following labs and imaging studies  CBC: Recent Labs  Lab 03/17/20 1654 03/18/20 0320 03/19/20 0317 03/20/20 0224 03/21/20 0241 03/22/20 0204  WBC 13.4* 13.2* 17.6* 15.7* 12.9* 12.6*  NEUTROABS 9.2*  --  15.4*  --   --   --   HGB 13.3 11.9* 13.3 10.5* 8.7* 8.4*  HCT 41.0 36.1 40.4 31.8* 26.0* 25.8*  MCV 95.6 95.0 93.5 95.2 94.9 95.6  PLT 311 254 300 191 164 190   Basic Metabolic Panel: Recent Labs  Lab 03/18/20 0320 03/19/20 0317 03/20/20 0224 03/21/20 0241 03/22/20 0204  NA 138 134* 133* 139 135  K 3.6 4.5 4.7 3.5 3.4*  CL 104 100 100 105 101  CO2 22 21* 21* 25 25  GLUCOSE 133* 122* 128* 97 103*  BUN 24* 20 29* 35* 31*  CREATININE 0.96 1.00 1.24* 1.38* 1.28*  CALCIUM 8.9 8.9 8.9 8.4* 8.1*  MG  --  2.4  --   --   --    GFR: Estimated Creatinine Clearance: 23.4 mL/min (A) (by C-G formula based on SCr of 1.28 mg/dL (H)). Liver Function Tests: Recent Labs  Lab 03/18/20 0320 03/19/20 0317  AST 33 35  ALT 23 16  ALKPHOS 111 110  BILITOT 1.6* 2.5*  PROT 7.2 7.4  ALBUMIN 4.2 3.9   No results for input(s): LIPASE, AMYLASE in the last 168 hours. No results for input(s): AMMONIA in the last 168 hours. Coagulation Profile: Recent Labs  Lab 03/17/20 1654  INR 1.0   Cardiac Enzymes: No results for input(s): CKTOTAL, CKMB, CKMBINDEX, TROPONINI in the last 168 hours. BNP (last 3 results) No results  for input(s): PROBNP in the last 8760 hours. HbA1C: No results for input(s): HGBA1C in the last 72 hours. CBG: No results for input(s): GLUCAP in the last 168 hours. Lipid Profile: No results for input(s): CHOL, HDL, LDLCALC, TRIG, CHOLHDL, LDLDIRECT in the last 72 hours. Thyroid Function Tests: No results for input(s): TSH, T4TOTAL, FREET4, T3FREE, THYROIDAB in the last 72 hours. Anemia Panel: No results for input(s): VITAMINB12, FOLATE, FERRITIN, TIBC, IRON, RETICCTPCT in the last 72 hours. Sepsis Labs: No results for input(s): PROCALCITON, LATICACIDVEN in the last 168 hours.  Recent Results (from the past 240 hour(s))  Resp Panel by RT-PCR (Flu A&B, Covid) Nasopharyngeal Swab     Status: None   Collection Time: 03/17/20  5:10 PM   Specimen: Nasopharyngeal Swab; Nasopharyngeal(NP) swabs in vial transport medium  Result Value Ref Range Status   SARS Coronavirus 2 by RT PCR NEGATIVE NEGATIVE Final    Comment: (NOTE) SARS-CoV-2 target nucleic acids are NOT DETECTED.  The SARS-CoV-2 RNA is generally detectable in upper respiratory specimens during the acute phase of infection. The lowest concentration of SARS-CoV-2 viral copies this assay can detect is 138 copies/mL. A negative result does not preclude SARS-Cov-2 infection and should not be used as the sole basis for treatment or other patient management decisions. A negative result may occur with  improper specimen collection/handling, submission of specimen other than nasopharyngeal swab, presence of viral mutation(s) within the areas targeted by this assay, and inadequate number of viral copies(<138 copies/mL). A negative result must be combined with clinical observations, patient history, and epidemiological information. The expected result is Negative.  Fact Sheet for Patients:  BloggerCourse.comhttps://www.fda.gov/media/152166/download  Fact Sheet for Healthcare Providers:  SeriousBroker.ithttps://www.fda.gov/media/152162/download  This test is no t yet  approved or cleared by the [place] FDA and  has been authorized for detection and/or diagnosis of SARS-CoV-2 by FDA under an Emergency Use Authorization (EUA). This EUA will remain  in effect (meaning this test can be used) for the duration of the COVID-19 declaration under Section 564(b)(1) of the Act, 21 U.S.C.section 360bbb-3(b)(1), unless the authorization is terminated  or revoked sooner.       Influenza A by PCR NEGATIVE NEGATIVE Final   Influenza B by PCR NEGATIVE NEGATIVE Final    Comment: (NOTE) The Xpert Xpress SARS-CoV-2/FLU/RSV plus assay is intended as an aid in the diagnosis of influenza from Nasopharyngeal swab specimens and should not be used as a sole basis for treatment. Nasal washings and aspirates are unacceptable for Xpert Xpress SARS-CoV-2/FLU/RSV testing.  Fact Sheet for Patients: BloggerCourse.comhttps://www.fda.gov/media/152166/download  Fact Sheet for Healthcare Providers: SeriousBroker.ithttps://www.fda.gov/media/152162/download  This test is not yet approved or cleared by the [place] FDA and has been authorized for detection and/or diagnosis of SARS-CoV-2 by FDA under an Emergency Use Authorization (EUA). This EUA will remain in effect (meaning this test can be used) for the duration of the COVID-19 declaration under Section 564(b)(1) of the Act, 21 U.S.C. section 360bbb-3(b)(1), unless the authorization is terminated or revoked.  Performed at [facility], [address] [place], [place] [address]   Surgical PCR screen     Status: Abnormal   Collection Time: 03/17/20  9:01 PM   Specimen: Nasal Mucosa; Nasal Swab  Result Value Ref Range Status   MRSA, PCR NEGATIVE NEGATIVE Final   Staphylococcus aureus POSITIVE (A) NEGATIVE Final    Comment: (NOTE) The Xpert SA Assay (FDA approved for NASAL specimens in patients 84 years of age and older), is one component of a comprehensive surveillance program. It is not intended to diagnose infection nor  to guide or monitor treatment. Performed at [facility], [address] [place], [place] [address]   Culture, Urine     Status: Abnormal   Collection Time: 03/18/20  3:20 PM   Specimen: Urine, Random  Result Value Ref Range Status   Specimen Description   Final    URINE, RANDOM Performed at [facility], [address] [place], [place] [address]    Special Requests   Final  NONE Performed at [facility], [place] [address] [place], [place] [address]    Culture >=100,000 COLONIES/mL ENTEROCOCCUS FAECALIS (A)  Final   Report Status 03/21/2020 FINAL  Final   Organism ID, Bacteria ENTEROCOCCUS FAECALIS (A)  Final      Susceptibility   Enterococcus faecalis - MIC*    AMPICILLIN <=2 SENSITIVE Sensitive     NITROFURANTOIN <=16 SENSITIVE Sensitive     VANCOMYCIN 1 SENSITIVE Sensitive     * >=100,000 COLONIES/mL ENTEROCOCCUS FAECALIS         Radiology Studies: No results found.      Scheduled Meds: . amLODipine  5 mg Oral Daily   And  . amLODipine  2.5 mg Oral QHS  . chlorhexidine  60 mL Topical Once  . cholecalciferol  2,000 Units Oral Daily  . docusate sodium  100 mg Oral BID  . enoxaparin (LOVENOX) injection  30 mg Subcutaneous Q24H  . magnesium oxide  400 mg Oral Daily  . metoprolol tartrate  25 mg Oral BID  . multivitamin with minerals  1 tablet Oral Daily  . mupirocin ointment  1 application Nasal BID  . pantoprazole  40 mg Oral Daily  . polyethylene glycol  17 g Oral Daily  . povidone-iodine  2 application Topical Once  . povidone-iodine  2 application Topical Once  . rosuvastatin  10 mg Oral Daily  . senna-docusate  1 tablet Oral BID  . zinc sulfate  220 mg Oral Daily   Continuous Infusions: . sodium chloride Stopped (03/20/20 0820)     LOS: 5 days    Time spent: More than 50% of that time was spent in counseling and/or coordination of care.      Shelly Coss, MD [facility] P12/27/2021, 2:09 PM

## 2020-03-22 NOTE — Progress Notes (Signed)
Subjective: 3 Days Post-Op Procedure(s) (LRB): TOTAL HIP ARTHROPLASTY ANTERIOR APPROACH (Right) Patient seen in rounds for Dr. Lyla Glassing this morning. Patient reports pain as mild.   Patient is doing very well, no complaints or concerns at this time.  No events overnight.  Objective: Vital signs in last 24 hours: Temp:  [98.8 F (37.1 C)-99.6 F (37.6 C)] 99.6 F (37.6 C) (12/27 1332) Pulse Rate:  [67-78] 76 (12/27 1332) Resp:  [16] 16 (12/27 1332) BP: (130-136)/(65-68) 136/68 (12/27 1332) SpO2:  [96 %-97 %] 96 % (12/27 1332)  Intake/Output from previous day: 12/26 0701 - 12/27 0700 In: 1080 [P.O.:1080] Out: 400 [Urine:400] Intake/Output this shift: Total I/O In: 550 [P.O.:550] Out: -   Recent Labs    03/20/20 0224 03/21/20 0241 03/22/20 0204  HGB 10.5* 8.7* 8.4*   Recent Labs    03/21/20 0241 03/22/20 0204  WBC 12.9* 12.6*  RBC 2.74* 2.70*  HCT 26.0* 25.8*  PLT 164 190   Recent Labs    03/21/20 0241 03/22/20 0204  NA 139 135  K 3.5 3.4*  CL 105 101  CO2 25 25  BUN 35* 31*  CREATININE 1.38* 1.28*  GLUCOSE 97 103*  CALCIUM 8.4* 8.1*   No results for input(s): LABPT, INR in the last 72 hours.  Neurologically intact Neurovascular intact Sensation intact distally Intact pulses distally Dorsiflexion/Plantar flexion intact Incision: dressing C/D/I No cellulitis present Compartment soft   Assessment/Plan: 3 Days Post-Op Procedure(s) (LRB): TOTAL HIP ARTHROPLASTY ANTERIOR APPROACH (Right) Up with therapy  In terms of an orthopedic standpoint the patient is doing very well. Patient now desires HHPT instead of rehab. I think this is reasonable. D/C once cleared by therapy.     Dorothyann Peng , PA-C [facility] [phone] 03/22/2020, 3:11 PM

## 2020-03-22 NOTE — TOC Progression Note (Signed)
Transition of Care [facility]) - Progression Note    Patient Details  Name: Tracy Rogers MRN: [id number] Date of Birth: 1931/03/20  Transition of Care [facility]) CM/SW Contact  Armanda Heritage, RN Phone Number: 03/22/2020, 3:10 PM  Clinical Narrative:    CM spoke with patient and niece, Tracy Rogers.  Patient states she doe snot want to go to SNF at this time and wishes to go home with [facility] services.  CM had a long discussion with patient regarding dc plans, patient shares she will hire private duty caregivers to be with her, she anticipates hiring someone to be in the home for 8 hours a day, mostly likely at night when patient feels she will need help with getting to the bathroom.  Patient is also interested in receiving home health PT/OT services.  Patient reports that she feels she can safely go home and maneuver around her home, reports she was able to ambulate today without needing assistance from nursing staff and was able to go to bathroom by herself.  CM did discuss that [facility] services are approximately 3x a week for therapy and that [facility] staff does not remain with patient for extended periods of time but rather comes in for visits, completes therapy and leaves.  Patient maintains that she wishes to go home, that she spoke with PA this morning and they discussed this at length and that she feels that due to the ongoing Covid-19 pandemic she feels she would have less exposure at home and be safer.    CM referred patient out to area [facility] agencies, most agencies declined due to staffing issues.  [facility] is able to provide HHPT/OT services.     Expected Discharge Plan: Home w Home Health Services (vs SNF) Barriers to Discharge: Continued Medical Work up  Expected Discharge Plan and Services Expected Discharge Plan: Home w Home Health Services (vs SNF)       Living arrangements for the past 2 months: Single Family Home                           HH Arranged: PT,OT HH Agency: [facility]  Home Care Date [facility] Agency Contacted: 03/22/20 Time HH Agency Contacted: [phone] Representative spoke with at [facility] Agency: Eber Jones   Social Determinants of Health (SDOH) Interventions    Readmission Risk Interventions Readmission Risk Prevention Plan 03/18/2020  Transportation Screening Complete  PCP or Specialist Appt within 5-7 Days Complete  Home Care Screening Complete  Some recent data might be hidden

## 2020-03-22 NOTE — Progress Notes (Signed)
Physical Therapy Treatment Patient Details Name: Tracy Rogers MRN: [id number] DOB: Aug 28, 1930 Today's Date: 03/22/2020    History of Present Illness Pt s/p fall with R hip fx and now s/p R THA by anterior direct approach.  Pt with hx of AKI, spinal stenosis, osteoporosis, back surgery and humerous fx.    PT Comments    Pt progressing well today. Reports she is planning for home. She is grossly at supervision level today for most mobility. Will need HHPT, HHOT, RW and 3in1. Will continue to follow in acute setting    Follow Up Recommendations  Home health PT;Supervision - Intermittent     Equipment Recommendations  Rolling walker with 5" wheels;3in1 (PT)    Recommendations for Other Services       Precautions / Restrictions Precautions Precautions: Fall Restrictions RLE Weight Bearing: Weight bearing as tolerated    Mobility  Bed Mobility               General bed mobility comments: pt in recliner on arrival. pt reports no difficulty getting in and out of bed  Transfers Overall transfer level: Needs assistance Equipment used: Rolling walker (2 wheeled) Transfers: Sit to/from Stand Sit to Stand: Min guard;Supervision         General transfer comment: for safety only, no physical assist  Ambulation/Gait Ambulation/Gait assistance: Min guard;Supervision Gait Distance (Feet): 160 Feet Assistive device: Rolling walker (2 wheeled) Gait Pattern/deviations: Step-to pattern;Step-through pattern;Decreased stance time - right Gait velocity: decr   General Gait Details: cues for progression, position from [employer]             Wheelchair Mobility    Modified Rankin (Stroke Patients Only)       Balance     Sitting balance-Leahy Scale: Good       Standing balance-Leahy Scale: Fair Standing balance comment: brielfy able to maintain static stand without assist, reliant on UEs for dynamic tasks                             Cognition Arousal/Alertness: Awake/alert Behavior During Therapy: WFL for tasks assessed/performed Overall Cognitive Status: Within Functional Limits for tasks assessed                                        Exercises      General Comments        Pertinent Vitals/Pain Pain Assessment: 0-10 Pain Score: 2  Pain Location: R hip Pain Descriptors / Indicators: Sore;Burning Pain Intervention(s): Limited activity within patient's tolerance;Monitored during session    Home Living                      Prior Function            PT Goals (current goals can now be found in the care plan section) Acute Rehab PT Goals Patient Stated Goal: Regain IND PT Goal Formulation: With patient Time For Goal Achievement: 03/27/20 Potential to Achieve Goals: Good Progress towards PT goals: Progressing toward goals    Frequency    7X/week      PT Plan Discharge plan needs to be updated    Co-evaluation              AM-PAC PT "6 Clicks" Mobility   Outcome Measure  Help needed turning from your back to your side  while in a flat bed without using bedrails?: None Help needed moving from lying on your back to sitting on the side of a flat bed without using bedrails?: None Help needed moving to and from a bed to a chair (including a wheelchair)?: None Help needed standing up from a chair using your arms (e.g., wheelchair or bedside chair)?: None Help needed to walk in hospital room?: A Little Help needed climbing 3-5 steps with a railing? : A Little 6 Click Score: 22    End of Session Equipment Utilized During Treatment: Gait belt Activity Tolerance: Patient tolerated treatment well Patient left: in chair;with call bell/phone within reach;with chair alarm set Nurse Communication: Mobility status PT Visit Diagnosis: Difficulty in walking, not elsewhere classified (R26.2)     Time: 0141-0301 PT Time Calculation (min) (ACUTE ONLY): 20 min  Charges:   $Gait Training: 8-22 mins                     Delice Bison, PT  Acute Rehab Dept ([facility]) [phone] Pager [phone]  03/22/2020    [facility] 03/22/2020, 11:10 AM

## 2020-03-22 NOTE — Progress Notes (Signed)
Physical Therapy Treatment Patient Details Name: Tracy Rogers MRN: [id number] DOB: 1931/01/20 Today's Date: 03/22/2020    History of Present Illness Pt s/p fall with R hip fx and now s/p R THA by anterior direct approach.  Pt with hx of AKI, spinal stenosis, osteoporosis, back surgery and humerous fx.    PT Comments    Pt progressing well. Plans to d/c home, hire comfort keepers for [place]. Will need HHPT/OT and DME below    Follow Up Recommendations  Home health PT;Supervision - Intermittent     Equipment Recommendations  Rolling walker with 5" wheels--youth ht;3in1 (PT)    Recommendations for Other Services       Precautions / Restrictions Precautions Precautions: Fall Restrictions RLE Weight Bearing: Weight bearing as tolerated    Mobility  Bed Mobility Overal bed mobility: Needs Assistance Bed Mobility: Sit to Supine       Sit to supine: Supervision   General bed mobility comments: incr time, supervision for safety  Transfers Overall transfer level: Needs assistance Equipment used: Rolling walker (2 wheeled) Transfers: Sit to/from Stand Sit to Stand: Min guard;Supervision         General transfer comment: for safety only, no physical assist  Ambulation/Gait Ambulation/Gait assistance: Min guard;Supervision Gait Distance (Feet): 15 Feet (x2) Assistive device: Rolling walker (2 wheeled) Gait Pattern/deviations: Step-to pattern;Step-through pattern;Decreased stance time - right Gait velocity: decr   General Gait Details: cues for progression, position from [employer]             Wheelchair Mobility    Modified Rankin (Stroke Patients Only)       Balance     Sitting balance-Leahy Scale: Good       Standing balance-Leahy Scale: Fair Standing balance comment: brielfy able to maintain static stand without assist, reliant on UEs for dynamic tasks                            Cognition Arousal/Alertness:  Awake/alert Behavior During Therapy: WFL for tasks assessed/performed Overall Cognitive Status: Within Functional Limits for tasks assessed                                        Exercises Total Joint Exercises Ankle Circles/Pumps: AROM;Both;15 reps;Supine Quad Sets: AROM;Both;10 reps;Supine Heel Slides: AROM;Right;10 reps Hip ABduction/ADduction: AROM;Right;10 reps    General Comments        Pertinent Vitals/Pain Pain Assessment: 0-10 Pain Score: 2  Pain Location: R hip Pain Descriptors / Indicators: Sore;Burning Pain Intervention(s): Limited activity within patient's tolerance;Monitored during session    Home Living                      Prior Function            PT Goals (current goals can now be found in the care plan section) Acute Rehab PT Goals Patient Stated Goal: Regain IND PT Goal Formulation: With patient Time For Goal Achievement: 03/27/20 Potential to Achieve Goals: Good Progress towards PT goals: Progressing toward goals    Frequency    7X/week      PT Plan Discharge plan needs to be updated    Co-evaluation              AM-PAC PT "6 Clicks" Mobility   Outcome Measure  Help needed turning from your back to  your side while in a flat bed without using bedrails?: None Help needed moving from lying on your back to sitting on the side of a flat bed without using bedrails?: None Help needed moving to and from a bed to a chair (including a wheelchair)?: None Help needed standing up from a chair using your arms (e.g., wheelchair or bedside chair)?: None Help needed to walk in hospital room?: A Little Help needed climbing 3-5 steps with a railing? : A Little 6 Click Score: 22    End of Session Equipment Utilized During Treatment: Gait belt Activity Tolerance: Patient tolerated treatment well Patient left: in chair;with call bell/phone within reach;with chair alarm set Nurse Communication: Mobility status PT Visit  Diagnosis: Difficulty in walking, not elsewhere classified (R26.2)     Time: 8315-1761 PT Time Calculation (min) (ACUTE ONLY): 23 min  Charges:  $Gait Training: 8-22 mins $Therapeutic Exercise: 8-22 mins                     Delice Bison, PT  Acute Rehab Dept ([facility]) [phone] Pager [phone]  03/22/2020    [facility] 03/22/2020, 2:39 PM

## 2020-03-23 ENCOUNTER — Other Ambulatory Visit: Payer: Medicare HMO

## 2020-03-23 ENCOUNTER — Encounter (HOSPITAL_COMMUNITY): Payer: Self-pay | Admitting: Orthopedic Surgery

## 2020-03-23 DIAGNOSIS — S72001A Fracture of unspecified part of neck of right femur, initial encounter for closed fracture: Secondary | ICD-10-CM | POA: Diagnosis not present

## 2020-03-23 LAB — BASIC METABOLIC PANEL
Anion gap: 10 (ref 5–15)
BUN: 28 mg/dL — ABNORMAL HIGH (ref 8–23)
CO2: 27 mmol/L (ref 22–32)
Calcium: 8.6 mg/dL — ABNORMAL LOW (ref 8.9–10.3)
Chloride: 103 mmol/L (ref 98–111)
Creatinine, Ser: 1 mg/dL (ref 0.44–1.00)
GFR, Estimated: 55 mL/min — ABNORMAL LOW (ref >60)
Glucose, Bld: 115 mg/dL — ABNORMAL HIGH (ref 70–99)
Potassium: 3.7 mmol/L (ref 3.5–5.1)
Sodium: 140 mmol/L (ref 135–145)

## 2020-03-23 LAB — CBC WITH DIFFERENTIAL/PLATELET
Abs Immature Granulocytes: 0.15 10*3/uL — ABNORMAL HIGH (ref 0.00–0.07)
Basophils Absolute: 0.1 10*3/uL (ref 0.0–0.1)
Basophils Relative: 1 %
Eosinophils Absolute: 0.5 10*3/uL (ref 0.0–0.5)
Eosinophils Relative: 4 %
HCT: 28.2 % — ABNORMAL LOW (ref 36.0–46.0)
Hemoglobin: 9.3 g/dL — ABNORMAL LOW (ref 12.0–15.0)
Immature Granulocytes: 1 %
Lymphocytes Relative: 20 %
Lymphs Abs: 2.4 10*3/uL (ref 0.7–4.0)
MCH: 31.6 pg (ref 26.0–34.0)
MCHC: 33 g/dL (ref 30.0–36.0)
MCV: 95.9 fL (ref 80.0–100.0)
Monocytes Absolute: 1.4 10*3/uL — ABNORMAL HIGH (ref 0.1–1.0)
Monocytes Relative: 12 %
Neutro Abs: 7.6 10*3/uL (ref 1.7–7.7)
Neutrophils Relative %: 62 %
Platelets: 243 10*3/uL (ref 150–400)
RBC: 2.94 MIL/uL — ABNORMAL LOW (ref 3.87–5.11)
RDW: 14.3 % (ref 11.5–15.5)
WBC: 12.1 10*3/uL — ABNORMAL HIGH (ref 4.0–10.5)
nRBC: 0 % (ref 0.0–0.2)

## 2020-03-23 NOTE — TOC Progression Note (Signed)
Transition of Care [facility]) - Progression Note    Patient Details  Name: Tracy Rogers MRN: [id number] Date of Birth: [birth year]  Transition of Care [facility]) CM/SW Contact  Armanda Heritage, RN Phone Number: 03/23/2020, 11:12 AM  Clinical Narrative:    Referral for dme rolling walker and 3in1 given to [facility] with [employer], equipment to be delivered to bedside.   Expected Discharge Plan: Home w Home Health Services (vs SNF) Barriers to Discharge: Continued Medical Work up  Expected Discharge Plan and Services Expected Discharge Plan: Home w Home Health Services (vs SNF)       Living arrangements for the past 2 months: Single Family Home Expected Discharge Date: 03/23/20               DME Arranged: Dan Humphreys rolling,3-N-1 DME Agency: Other - [employer]) Date DME Agency Contacted: 03/23/20 Time DME Agency Contacted: [phone] Representative spoke with at DME Agency: Vaughan Basta HH Arranged: PT,OT HH Agency: [facility] Care Date [facility] Agency Contacted: 03/22/20 Time HH Agency Contacted: [phone] Representative spoke with at [facility] Agency: Eber Jones   Social Determinants of Health (SDOH) Interventions    Readmission Risk Interventions Readmission Risk Prevention Plan 03/18/2020  Transportation Screening Complete  PCP or Specialist Appt within 5-7 Days Complete  Home Care Screening Complete  Some recent data might be hidden

## 2020-03-23 NOTE — Progress Notes (Signed)
Patient discharged to home w/ friends. Given all belongings, instructions, equipment, prescription. Verbalized understanding of all instructions. Escorted to pov via w/c.

## 2020-03-23 NOTE — Discharge Summary (Signed)
Physician Discharge Summary  Tracy Rogers [id number] DOB: [birth year] DOA: 03/17/2020  PCP: Vernie Shanks, MD  Admit date: 03/17/2020 Discharge date: 03/23/2020  Admitted From: Home Disposition:  Home  Discharge Condition:Stable CODE STATUS:FULL Diet recommendation: Heart Healthy  Brief/Interim Summary:  Doreatha Colby a 86 y.o.femalewith medical history significant ofcoronary artery disease, hypertension, hyperlipidemia, osteopenia with osteoporosis, multiple fractures in the past, spinal stenosis and osteoarthritis who presents after sustaining mechanical fall at home.  Patient was walking to her car when she suddenly lost her step and fell. She believes her right knee gave out on her. She fell landing on her right side.  She was found to have displaced right femoral neck fracture.  Underwent total right hip arthroplasty on 12/24.    PT/OT recommended home health.  She is medically stable for discharge home today.   Following Problems were addressed during her hospitalization:  Displaced right femoral neck fracture with history of osteoporosis -s/p total right hip arthroplasty on 12/24  PT/OT initially recommended skilled nursing facility.  But now recommending home.  Will arrange home health.  Patient wants to go home today.  We will continue  on aspirin for DVT prophylaxis.  Leukocytosis Improved  AKI/azotemia resolved  CAD -Stable, recently  saw her cardiologist , brilinta stopped.Now on aspirin  Hyperlipidemia  continue statin  Hypertension continue home meds Norvasc and metoprolol   Hypokalemia: Supplemented with potassium.   Discharge Diagnoses:  Principal Problem:   Femur fracture, right ([place]) Active Problems:   Essential hypertension   Mixed hyperlipidemia   Osteoporosis  Repeatedly refuses Rx meds or injectables.  See [facility] reports   CAD (coronary artery disease)   AKI (acute kidney injury) [facility])    Discharge  Instructions  Discharge Instructions    Diet - low sodium heart healthy   Complete by: As directed    Discharge instructions   Complete by: As directed    1)Please follow up with orthopedics as an outpatient in 2 weeks.  Name and number of the provider has been attached 2) take prescribed medications as instructed 3)Follow up with home health   Increase activity slowly   Complete by: As directed    No wound care   Complete by: As directed      Allergies as of 03/23/2020      Reactions   Penicillins Other (See Comments)   PT SPIKED FEVER Tolerated 2 grams Ancef.   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   Oxycodone Nausea And Vomiting      Medication List    STOP taking these medications   furosemide 20 MG tablet Commonly known as: LASIX   ticagrelor 90 MG Tabs tablet Commonly known as: BRILINTA     TAKE these medications   amLODipine 5 MG tablet Commonly known as: NORVASC Take 2.5-5 mg by mouth See admin instructions. Takes 5mg  by mouth in the morning and 2.5 mg in the evening   aspirin 81 MG EC tablet Take 1 tablet (81 mg total) by mouth 2 (two) times daily with a meal. What changed: when to take this   Bilberry 1000 MG Caps Take 1,000 mg by mouth in the morning and at bedtime.   CoQ10 200 MG Caps Take 200 mg by mouth in the morning and at bedtime.   diphenhydramine-acetaminophen 25-500 MG Tabs tablet Commonly known as: TYLENOL PM  Take 1 tablet by mouth at bedtime as needed (sleep/pain).   ELDERBERRY PO Take 1 tablet by mouth in the morning and at bedtime.   GARLIC [id number] PO Take [id number] mg by mouth in the morning and at bedtime.   LECITHIN PO Take 1 tablet by mouth daily.   Lutein 40 MG Caps Take 40 mg by mouth daily.   magnesium oxide 400 MG tablet Commonly known as: MAG-OX Take 400 mg by mouth daily.   metoprolol  tartrate 25 MG tablet Commonly known as: LOPRESSOR TAKE 1 TABLET BY MOUTH TWICE A DAY   multivitamin capsule Take 1 capsule by mouth daily.   nitroGLYCERIN 0.4 MG SL tablet Commonly known as: NITROSTAT Place 0.4 mg under the tongue every 5 (five) minutes as needed for chest pain.   ondansetron 4 MG tablet Commonly known as: ZOFRAN Take 1 tablet (4 mg total) by mouth every 6 (six) hours as needed for nausea.   pantoprazole 40 MG tablet Commonly known as: PROTONIX TAKE 1 TABLET BY MOUTH EVERY DAY   rosuvastatin 10 MG tablet Commonly known as: CRESTOR TAKE 1 TABLET BY MOUTH EVERY DAY   traMADol 50 MG tablet Commonly known as: Ultram Take 1 tablet (50 mg total) by mouth every 6 (six) hours as needed.   VITA-C PO Take 1,000 mg by mouth daily.   Vitamin D3 50 MCG (2000 UT) capsule Take 2,000 Units by mouth daily.   Zinc 25 MG Tabs Take 25 mg by mouth daily.            Durable Medical Equipment  (From admission, onward)         Start     Ordered   03/23/20 1049  For home use only DME Walker rolling  Once       Question Answer Comment  Walker: With [place] Wheels   Patient needs a walker to treat with the following condition Balance disorder      03/23/20 1048   03/23/20 1049  For home use only DME 3 n 1  Once        03/23/20 1048          Follow-up Information    Swinteck, Aaron Edelman, MD. Schedule an appointment as soon as possible for a visit in 2 weeks.   Specialty: Orthopedic Surgery Why: For wound re-check Contact information: [address] [place] [address] [phone]        Home, Medi Follow up.   Why: agency will provide home health physical and occupational therapy. Contact information: [address] [place] [address] [phone]              Allergies  Allergen Reactions  . Penicillins Other (See Comments)    PT SPIKED FEVER Tolerated 2 grams Ancef.  . Bactrim [Sulfamethoxazole-Trimethoprim]   .  Benzodiazepines   . Hydrocodone Nausea And Vomiting  . Meloxicam   . 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  . Oxycodone Nausea And Vomiting    Consultations:  Orthopedics   Procedures/Studies: Pelvis Portable  Result Date: 03/19/2020 CLINICAL DATA:  Status post right hip replacement EXAM: PORTABLE PELVIS 1-2 VIEWS COMPARISON:  Intraoperative films from earlier in the same day. FINDINGS: Right hip replacement is noted in satisfactory position. No bony or soft tissue abnormality is noted. IMPRESSION: Status post right hip replacement. Electronically Signed   By: Inez Catalina M.D.   On: 03/19/2020 11:51   DG C-Arm 1-60  Min-No Report  Result Date: 03/19/2020 Fluoroscopy was utilized by the requesting physician.  No radiographic interpretation.   DG HIP OPERATIVE UNILAT W OR W/O PELVIS RIGHT  Result Date: 03/19/2020 CLINICAL DATA:  Right hip replacement. EXAM: OPERATIVE RIGHT HIP (WITH PELVIS IF PERFORMED) 2 VIEWS TECHNIQUE: Fluoroscopic spot image(s) were submitted for interpretation post-operatively. COMPARISON:  03/17/2020 FINDINGS: 4 intraoperative spot fluoro images were obtained. Initial 2 images show femoral neck fracture is visualized on the film from 03/17/2020. Subsequent 2 images obtained after total hip replacement. No complicating features. IMPRESSION: Intraoperative assessment during right total hip replacement. No complicating features. Electronically Signed   By: Kennith Center M.D.   On: 03/19/2020 09:39   DG Hip Unilat  With Pelvis 2-3 Views Right  Result Date: 03/17/2020 CLINICAL DATA:  86 year old female with fall and right pain. EXAM: DG HIP (WITH OR WITHOUT PELVIS) 2-3V RIGHT COMPARISON:  CT abdomen pelvis dated 04/25/2014. FINDINGS: There is a displaced fracture of the right femoral neck. No dislocation. The bones are osteopenic. Mild bilateral hip arthritic changes. Degenerative changes of the lower lumbar spine. The soft tissues are unremarkable.  IMPRESSION: Displaced fracture of the right femoral neck. No dislocation. Electronically Signed   By: Elgie Collard M.D.   On: 03/17/2020 18:08      Subjective: Patient seen and examined at the bedside this morning.  Hemodynamically stable.  Medically stable for discharge today.  Discharge Exam: Vitals:   03/22/20 2052 03/23/20 0542  BP: 140/66 (!) 160/74  Pulse: 78 75  Resp: 16 14  Temp: 98.7 F (37.1 C) 98.1 F (36.7 C)  SpO2: 96% 100%   Vitals:   03/22/20 0420 03/22/20 1332 03/22/20 2052 03/23/20 0542  BP: 136/66 136/68 140/66 (!) 160/74  Pulse: 67 76 78 75  Resp: 16 16 16 14   Temp: 98.8 F (37.1 C) 99.6 F (37.6 C) 98.7 F (37.1 C) 98.1 F (36.7 C)  TempSrc: Oral Oral Oral Oral  SpO2: 97% 96% 96% 100%  Weight:      Height:        General: Pt is alert, awake, not in acute distress Cardiovascular: RRR, S1/S2 +, no rubs, no gallops Respiratory: CTA bilaterally, no wheezing, no rhonchi Abdominal: Soft, NT, ND, bowel sounds + Extremities: no edema, no cyanosis    The results of significant diagnostics from this hospitalization (including imaging, microbiology, ancillary and laboratory) are listed below for reference.     Microbiology: Recent Results (from the past 240 hour(s))  Resp Panel by RT-PCR (Flu A&B, Covid) Nasopharyngeal Swab     Status: None   Collection Time: 03/17/20  5:10 PM   Specimen: Nasopharyngeal Swab; Nasopharyngeal(NP) swabs in vial transport medium  Result Value Ref Range Status   SARS Coronavirus 2 by RT PCR NEGATIVE NEGATIVE Final    Comment: (NOTE) SARS-CoV-2 target nucleic acids are NOT DETECTED.  The SARS-CoV-2 RNA is generally detectable in upper respiratory specimens during the acute phase of infection. The lowest concentration of SARS-CoV-2 viral copies this assay can detect is 138 copies/mL. A negative result does not preclude SARS-Cov-2 infection and should not be used as the sole basis for treatment or other patient  management decisions. A negative result may occur with  improper specimen collection/handling, submission of specimen other than nasopharyngeal swab, presence of viral mutation(s) within the areas targeted by this assay, and inadequate number of viral copies(<138 copies/mL). A negative result must be combined with clinical observations, patient history, and epidemiological information. The expected result is Negative.  Fact Sheet for Patients:  EntrepreneurPulse.com.au  Fact Sheet for Healthcare Providers:  IncredibleEmployment.be  This test is no t yet approved or cleared by the [place] FDA and  has been authorized for detection and/or diagnosis of SARS-CoV-2 by FDA under an Emergency Use Authorization (EUA). This EUA will remain  in effect (meaning this test can be used) for the duration of the COVID-19 declaration under Section 564(b)(1) of the Act, 21 U.S.C.section 360bbb-3(b)(1), unless the authorization is terminated  or revoked sooner.       Influenza A by PCR NEGATIVE NEGATIVE Final   Influenza B by PCR NEGATIVE NEGATIVE Final    Comment: (NOTE) The Xpert Xpress SARS-CoV-2/FLU/RSV plus assay is intended as an aid in the diagnosis of influenza from Nasopharyngeal swab specimens and should not be used as a sole basis for treatment. Nasal washings and aspirates are unacceptable for Xpert Xpress SARS-CoV-2/FLU/RSV testing.  Fact Sheet for Patients: EntrepreneurPulse.com.au  Fact Sheet for Healthcare Providers: IncredibleEmployment.be  This test is not yet approved or cleared by the [place] FDA and has been authorized for detection and/or diagnosis of SARS-CoV-2 by FDA under an Emergency Use Authorization (EUA). This EUA will remain in effect (meaning this test can be used) for the duration of the COVID-19 declaration under Section 564(b)(1) of the Act, 21 U.S.C. section 360bbb-3(b)(1),  unless the authorization is terminated or revoked.  Performed at [facility], [place] [address], [place], [place] [address]   Surgical PCR screen     Status: Abnormal   Collection Time: 03/17/20  9:01 PM   Specimen: Nasal Mucosa; Nasal Swab  Result Value Ref Range Status   MRSA, PCR NEGATIVE NEGATIVE Final   Staphylococcus aureus POSITIVE (A) NEGATIVE Final    Comment: (NOTE) The Xpert SA Assay (FDA approved for NASAL specimens in patients 80 years of age and older), is one component of a comprehensive surveillance program. It is not intended to diagnose infection nor to guide or monitor treatment. Performed at [facility], [place] [address] [place], [place] [address]   Culture, Urine     Status: Abnormal   Collection Time: 03/18/20  3:20 PM   Specimen: Urine, Random  Result Value Ref Range Status   Specimen Description   Final    URINE, RANDOM Performed at [place] [address], [place], [place] [address]    Special Requests   Final    NONE Performed at [facility], [place] [address], [place], [place] [address]    Culture >=100,000 COLONIES/mL ENTEROCOCCUS FAECALIS (A)  Final   Report Status 03/21/2020 FINAL  Final   Organism ID, Bacteria ENTEROCOCCUS FAECALIS (A)  Final      Susceptibility   Enterococcus faecalis - MIC*    AMPICILLIN <=2 SENSITIVE Sensitive     NITROFURANTOIN <=16 SENSITIVE Sensitive     VANCOMYCIN 1 SENSITIVE Sensitive     * >=100,000 COLONIES/mL ENTEROCOCCUS FAECALIS     Labs: BNP (last 3 results) No results for input(s): BNP in the last 8760 hours. Basic Metabolic Panel: Recent Labs  Lab 03/19/20 0317 03/20/20 0224 03/21/20 0241 03/22/20 0204 03/23/20 0256  NA 134* 133* 139 135 140  K 4.5 4.7 3.5 3.4* 3.7  CL 100 100 105 101 103  CO2 21* 21* 25 25 27   GLUCOSE 122* 128* 97 103* 115*  BUN 20 29* 35* 31* 28*  CREATININE 1.00 1.24* 1.38* 1.28* 1.00  CALCIUM 8.9  8.9 8.4* 8.1* 8.6*  MG 2.4  --   --   --   --    Liver Function Tests: Recent Labs  Lab 03/18/20 0320 03/19/20 0317  AST 33 35  ALT 23 16  ALKPHOS 111 110  BILITOT 1.6* 2.5*  PROT 7.2 7.4  ALBUMIN 4.2 3.9   No results for input(s): LIPASE, AMYLASE in the last 168 hours. No results for input(s): AMMONIA in the last 168 hours. CBC: Recent Labs  Lab 03/17/20 1654 03/18/20 0320 03/19/20 0317 03/20/20 0224 03/21/20 0241 03/22/20 0204 03/23/20 0256  WBC 13.4*   < > 17.6* 15.7* 12.9* 12.6* 12.1*  NEUTROABS 9.2*  --  15.4*  --   --   --  7.6  HGB 13.3   < > 13.3 10.5* 8.7* 8.4* 9.3*  HCT 41.0   < > 40.4 31.8* 26.0* 25.8* 28.2*  MCV 95.6   < > 93.5 95.2 94.9 95.6 95.9  PLT 311   < > 300 191 164 190 243   < > = values in this interval not displayed.   Cardiac Enzymes: No results for input(s): CKTOTAL, CKMB, CKMBINDEX, TROPONINI in the last 168 hours. BNP: Invalid input(s): POCBNP CBG: No results for input(s): GLUCAP in the last 168 hours. D-Dimer No results for input(s): DDIMER in the last 72 hours. Hgb A1c No results for input(s): HGBA1C in the last 72 hours. Lipid Profile No results for input(s): CHOL, HDL, LDLCALC, TRIG, CHOLHDL, LDLDIRECT in the last 72 hours. Thyroid function studies No results for input(s): TSH, T4TOTAL, T3FREE, THYROIDAB in the last 72 hours.  Invalid input(s): FREET3 Anemia work up No results for input(s): VITAMINB12, FOLATE, FERRITIN, TIBC, IRON, RETICCTPCT in the last 72 hours. Urinalysis    Component Value Date/Time   COLORURINE YELLOW 03/21/2020 0848   APPEARANCEUR CLEAR 03/21/2020 0848   LABSPEC 1.012 03/21/2020 0848   PHURINE 5.0 03/21/2020 0848   GLUCOSEU NEGATIVE 03/21/2020 0848   HGBUR NEGATIVE 03/21/2020 0848   BILIRUBINUR NEGATIVE 03/21/2020 0848   BILIRUBINUR neg 06/30/2014 0850   KETONESUR NEGATIVE 03/21/2020 0848   PROTEINUR NEGATIVE 03/21/2020 0848   UROBILINOGEN negative 06/30/2014 0850   UROBILINOGEN 0.2 04/16/2011  2237   NITRITE NEGATIVE 03/21/2020 0848   LEUKOCYTESUR NEGATIVE 03/21/2020 0848   Sepsis Labs Invalid input(s): PROCALCITONIN,  WBC,  LACTICIDVEN Microbiology Recent Results (from the past 240 hour(s))  Resp Panel by RT-PCR (Flu A&B, Covid) Nasopharyngeal Swab     Status: None   Collection Time: 03/17/20  5:10 PM   Specimen: Nasopharyngeal Swab; Nasopharyngeal(NP) swabs in vial transport medium  Result Value Ref Range Status   SARS Coronavirus 2 by RT PCR NEGATIVE NEGATIVE Final    Comment: (NOTE) SARS-CoV-2 target nucleic acids are NOT DETECTED.  The SARS-CoV-2 RNA is generally detectable in upper respiratory specimens during the acute phase of infection. The lowest concentration of SARS-CoV-2 viral copies this assay can detect is 138 copies/mL. A negative result does not preclude SARS-Cov-2 infection and should not be used as the sole basis for treatment or other patient management decisions. A negative result may occur with  improper specimen collection/handling, submission of specimen other than nasopharyngeal swab, presence of viral mutation(s) within the areas targeted by this assay, and inadequate number of viral copies(<138 copies/mL). A negative result must be combined with clinical observations, patient history, and epidemiological information. The expected result is Negative.  Fact Sheet for Patients:  EntrepreneurPulse.com.au  Fact Sheet for Healthcare Providers:  IncredibleEmployment.be  This test is no t yet approved or cleared  by the [place] and  has been authorized for detection and/or diagnosis of SARS-CoV-2 by FDA under an Emergency Use Authorization (EUA). This EUA will remain  in effect (meaning this test can be used) for the duration of the COVID-19 declaration under Section 564(b)(1) of the Act, 21 U.S.C.section 360bbb-3(b)(1), unless the authorization is terminated  or revoked sooner.       Influenza A by  PCR NEGATIVE NEGATIVE Final   Influenza B by PCR NEGATIVE NEGATIVE Final    Comment: (NOTE) The Xpert Xpress SARS-CoV-2/FLU/RSV plus assay is intended as an aid in the diagnosis of influenza from Nasopharyngeal swab specimens and should not be used as a sole basis for treatment. Nasal washings and aspirates are unacceptable for Xpert Xpress SARS-CoV-2/FLU/RSV testing.  Fact Sheet for Patients: EntrepreneurPulse.com.au  Fact Sheet for Healthcare Providers: IncredibleEmployment.be  This test is not yet approved or cleared by the [place] FDA and has been authorized for detection and/or diagnosis of SARS-CoV-2 by FDA under an Emergency Use Authorization (EUA). This EUA will remain in effect (meaning this test can be used) for the duration of the COVID-19 declaration under Section 564(b)(1) of the Act, 21 U.S.C. section 360bbb-3(b)(1), unless the authorization is terminated or revoked.  Performed at [facility], [place] [address], [place], [place] [address]   Surgical PCR screen     Status: Abnormal   Collection Time: 03/17/20  9:01 PM   Specimen: Nasal Mucosa; Nasal Swab  Result Value Ref Range Status   MRSA, PCR NEGATIVE NEGATIVE Final   Staphylococcus aureus POSITIVE (A) NEGATIVE Final    Comment: (NOTE) The Xpert SA Assay (FDA approved for NASAL specimens in patients 46 years of age and older), is one component of a comprehensive surveillance program. It is not intended to diagnose infection nor to guide or monitor treatment. Performed at [facility], [place] [address], [place], [place] [address]   Culture, Urine     Status: Abnormal   Collection Time: 03/18/20  3:20 PM   Specimen: Urine, Random  Result Value Ref Range Status   Specimen Description   Final    URINE, RANDOM Performed at [place] [address] [place], [place] [address]    Special Requests   Final     NONE Performed at [facility], [place] [address], [place],  [address]    Culture >=100,000 COLONIES/mL ENTEROCOCCUS FAECALIS (A)  Final   Report Status 03/21/2020 FINAL  Final   Organism ID, Bacteria ENTEROCOCCUS FAECALIS (A)  Final      Susceptibility   Enterococcus faecalis - MIC*    AMPICILLIN <=2 SENSITIVE Sensitive     NITROFURANTOIN <=16 SENSITIVE Sensitive     VANCOMYCIN 1 SENSITIVE Sensitive     * >=100,000 COLONIES/mL ENTEROCOCCUS FAECALIS    Please note: You were cared for by a hospitalist during your hospital stay. Once you are discharged, your primary care physician will handle any further medical issues. Please note that NO REFILLS for any discharge medications will be authorized once you are discharged, as it is imperative that you return to your primary care physician (or establish a relationship with a primary care physician if you do not have one) for your post hospital discharge needs so that they can reassess your need for medications and monitor your lab values.    Time coordinating discharge: 40 minutes  SIGNED:   Shelly Coss, MD  [facility] 03/23/2020, 10:54 AM Pager [phone]  If 7PM-7AM, please contact night-coverage [email or web address] Password [email or web address]

## 2020-03-23 NOTE — Progress Notes (Signed)
Physical Therapy Treatment Patient Details Name: Tracy Rogers MRN: [id number] DOB: 1930-04-05 Today's Date: 03/23/2020    History of Present Illness Pt s/p fall with R hip fx and now s/p R THA by anterior direct approach.  Pt with hx of AKI, spinal stenosis, osteoporosis, back surgery and humerous fx.    PT Comments    Pt progressing well today. incr tol to activity/incr gait distance. Improving gait stability. Pt is planning to hire incr assist at home, will benefit from HHPT   Follow Up Recommendations  Home health PT;Supervision - Intermittent     Equipment Recommendations  Rolling walker with 5" wheels;3in1 (PT)    Recommendations for Other Services       Precautions / Restrictions Precautions Precautions: Fall Restrictions Weight Bearing Restrictions: No RLE Weight Bearing: Weight bearing as tolerated    Mobility  Bed Mobility Overal bed mobility: Modified Independent Bed Mobility: Sit to Supine     Supine to sit: Modified independent (Device/Increase time)     General bed mobility comments: incr time, self assists RLE with UEs  Transfers Overall transfer level: Needs assistance Equipment used: Rolling walker (2 wheeled) Transfers: Sit to/from Stand Sit to Stand: Supervision;Modified independent (Device/Increase time)         General transfer comment: for safety only, no physical assist, no LOB. from chair, toilet and bed  Ambulation/Gait Ambulation/Gait assistance: Supervision;Modified independent (Device/Increase time) Gait Distance (Feet): 300 Feet Assistive device: Rolling walker (2 wheeled) Gait Pattern/deviations: Step-through pattern;Decreased stance time - right Gait velocity: decr   General Gait Details: no LOB, steady gait with RW over smooth level surface.   Stairs Stairs: Yes Stairs assistance: Min guard;Min assist Stair Management: No rails;Step to pattern;Forwards;With walker Number of Stairs: 1 General stair comments: cues  for safety and sequence   Wheelchair Mobility    Modified Rankin (Stroke Patients Only)       Balance             Standing balance-Leahy Scale: Fair Standing balance comment: brielfy able to maintain static stand without assist, completes peri-care in standign without LOB. reliant on UEs for wt shfiting outside BOS                            Cognition Arousal/Alertness: Awake/alert Behavior During Therapy: WFL for tasks assessed/performed Overall Cognitive Status: Within Functional Limits for tasks assessed                                        Exercises      General Comments        Pertinent Vitals/Pain Pain Assessment: 0-10 Pain Score: 2  Pain Location: R hip Pain Descriptors / Indicators: Sore;Burning Pain Intervention(s): Limited activity within patient's tolerance;Monitored during session;Repositioned    Home Living                      Prior Function            PT Goals (current goals can now be found in the care plan section) Acute Rehab PT Goals Patient Stated Goal: Regain IND PT Goal Formulation: With patient Time For Goal Achievement: 03/27/20 Potential to Achieve Goals: Good Progress towards PT goals: Progressing toward goals    Frequency    7X/week      PT Plan Discharge plan needs to be updated  Co-evaluation              AM-PAC PT "6 Clicks" Mobility   Outcome Measure  Help needed turning from your back to your side while in a flat bed without using bedrails?: None Help needed moving from lying on your back to sitting on the side of a flat bed without using bedrails?: None Help needed moving to and from a bed to a chair (including a wheelchair)?: None Help needed standing up from a chair using your arms (e.g., wheelchair or bedside chair)?: None Help needed to walk in hospital room?: None Help needed climbing 3-5 steps with a railing? : A Little 6 Click Score: 23    End of  Session Equipment Utilized During Treatment: Gait belt Activity Tolerance: Patient tolerated treatment well Patient left: in bed;with call bell/phone within reach;with bed alarm set Nurse Communication: Mobility status PT Visit Diagnosis: Difficulty in walking, not elsewhere classified (R26.2)     Time: [id number] PT Time Calculation (min) (ACUTE ONLY): 19 min  Charges:  $Gait Training: 8-22 mins                     Baxter Flattery, PT  Acute Rehab Dept ([place]) [phone] Pager [phone]  03/23/2020    [facility] 03/23/2020, 10:51 AM

## 2020-03-23 NOTE — Plan of Care (Signed)
Plan of care reviewed and discussed with the patient. 

## 2020-03-24 ENCOUNTER — Other Ambulatory Visit: Payer: Self-pay | Admitting: Cardiology

## 2020-03-27 DIAGNOSIS — S72001D Fracture of unspecified part of neck of right femur, subsequent encounter for closed fracture with routine healing: Secondary | ICD-10-CM | POA: Diagnosis not present

## 2020-03-27 DIAGNOSIS — E782 Mixed hyperlipidemia: Secondary | ICD-10-CM | POA: Diagnosis not present

## 2020-03-27 DIAGNOSIS — M159 Polyosteoarthritis, unspecified: Secondary | ICD-10-CM | POA: Diagnosis not present

## 2020-03-27 DIAGNOSIS — M5126 Other intervertebral disc displacement, lumbar region: Secondary | ICD-10-CM | POA: Diagnosis not present

## 2020-03-27 DIAGNOSIS — I10 Essential (primary) hypertension: Secondary | ICD-10-CM | POA: Diagnosis not present

## 2020-03-27 DIAGNOSIS — M810 Age-related osteoporosis without current pathological fracture: Secondary | ICD-10-CM | POA: Diagnosis not present

## 2020-03-27 DIAGNOSIS — I2510 Atherosclerotic heart disease of native coronary artery without angina pectoris: Secondary | ICD-10-CM | POA: Diagnosis not present

## 2020-03-27 DIAGNOSIS — M5430 Sciatica, unspecified side: Secondary | ICD-10-CM | POA: Diagnosis not present

## 2020-03-27 DIAGNOSIS — M4800 Spinal stenosis, site unspecified: Secondary | ICD-10-CM | POA: Diagnosis not present

## 2020-03-27 DIAGNOSIS — Z96641 Presence of right artificial hip joint: Secondary | ICD-10-CM | POA: Diagnosis not present

## 2020-03-30 DIAGNOSIS — E782 Mixed hyperlipidemia: Secondary | ICD-10-CM | POA: Diagnosis not present

## 2020-03-30 DIAGNOSIS — M4800 Spinal stenosis, site unspecified: Secondary | ICD-10-CM | POA: Diagnosis not present

## 2020-03-30 DIAGNOSIS — I10 Essential (primary) hypertension: Secondary | ICD-10-CM | POA: Diagnosis not present

## 2020-03-30 DIAGNOSIS — I2510 Atherosclerotic heart disease of native coronary artery without angina pectoris: Secondary | ICD-10-CM | POA: Diagnosis not present

## 2020-03-30 DIAGNOSIS — M5126 Other intervertebral disc displacement, lumbar region: Secondary | ICD-10-CM | POA: Diagnosis not present

## 2020-03-30 DIAGNOSIS — S72001D Fracture of unspecified part of neck of right femur, subsequent encounter for closed fracture with routine healing: Secondary | ICD-10-CM | POA: Diagnosis not present

## 2020-03-30 DIAGNOSIS — M159 Polyosteoarthritis, unspecified: Secondary | ICD-10-CM | POA: Diagnosis not present

## 2020-03-30 DIAGNOSIS — M810 Age-related osteoporosis without current pathological fracture: Secondary | ICD-10-CM | POA: Diagnosis not present

## 2020-03-30 DIAGNOSIS — Z96641 Presence of right artificial hip joint: Secondary | ICD-10-CM | POA: Diagnosis not present

## 2020-03-30 DIAGNOSIS — M5430 Sciatica, unspecified side: Secondary | ICD-10-CM | POA: Diagnosis not present

## 2020-03-31 DIAGNOSIS — Z96641 Presence of right artificial hip joint: Secondary | ICD-10-CM | POA: Diagnosis not present

## 2020-03-31 DIAGNOSIS — S72001D Fracture of unspecified part of neck of right femur, subsequent encounter for closed fracture with routine healing: Secondary | ICD-10-CM | POA: Diagnosis not present

## 2020-03-31 DIAGNOSIS — I10 Essential (primary) hypertension: Secondary | ICD-10-CM | POA: Diagnosis not present

## 2020-03-31 DIAGNOSIS — I2510 Atherosclerotic heart disease of native coronary artery without angina pectoris: Secondary | ICD-10-CM | POA: Diagnosis not present

## 2020-03-31 DIAGNOSIS — M5126 Other intervertebral disc displacement, lumbar region: Secondary | ICD-10-CM | POA: Diagnosis not present

## 2020-03-31 DIAGNOSIS — M4800 Spinal stenosis, site unspecified: Secondary | ICD-10-CM | POA: Diagnosis not present

## 2020-03-31 DIAGNOSIS — M159 Polyosteoarthritis, unspecified: Secondary | ICD-10-CM | POA: Diagnosis not present

## 2020-03-31 DIAGNOSIS — M5430 Sciatica, unspecified side: Secondary | ICD-10-CM | POA: Diagnosis not present

## 2020-03-31 DIAGNOSIS — M810 Age-related osteoporosis without current pathological fracture: Secondary | ICD-10-CM | POA: Diagnosis not present

## 2020-03-31 DIAGNOSIS — E782 Mixed hyperlipidemia: Secondary | ICD-10-CM | POA: Diagnosis not present

## 2020-04-02 DIAGNOSIS — M4800 Spinal stenosis, site unspecified: Secondary | ICD-10-CM | POA: Diagnosis not present

## 2020-04-02 DIAGNOSIS — M5126 Other intervertebral disc displacement, lumbar region: Secondary | ICD-10-CM | POA: Diagnosis not present

## 2020-04-02 DIAGNOSIS — I10 Essential (primary) hypertension: Secondary | ICD-10-CM | POA: Diagnosis not present

## 2020-04-02 DIAGNOSIS — I2510 Atherosclerotic heart disease of native coronary artery without angina pectoris: Secondary | ICD-10-CM | POA: Diagnosis not present

## 2020-04-02 DIAGNOSIS — M159 Polyosteoarthritis, unspecified: Secondary | ICD-10-CM | POA: Diagnosis not present

## 2020-04-02 DIAGNOSIS — E782 Mixed hyperlipidemia: Secondary | ICD-10-CM | POA: Diagnosis not present

## 2020-04-02 DIAGNOSIS — M810 Age-related osteoporosis without current pathological fracture: Secondary | ICD-10-CM | POA: Diagnosis not present

## 2020-04-02 DIAGNOSIS — M5430 Sciatica, unspecified side: Secondary | ICD-10-CM | POA: Diagnosis not present

## 2020-04-02 DIAGNOSIS — S72001D Fracture of unspecified part of neck of right femur, subsequent encounter for closed fracture with routine healing: Secondary | ICD-10-CM | POA: Diagnosis not present

## 2020-04-02 DIAGNOSIS — Z96641 Presence of right artificial hip joint: Secondary | ICD-10-CM | POA: Diagnosis not present

## 2020-04-05 DIAGNOSIS — S72031D Displaced midcervical fracture of right femur, subsequent encounter for closed fracture with routine healing: Secondary | ICD-10-CM | POA: Diagnosis not present

## 2020-04-06 DIAGNOSIS — M159 Polyosteoarthritis, unspecified: Secondary | ICD-10-CM | POA: Diagnosis not present

## 2020-04-06 DIAGNOSIS — Z96641 Presence of right artificial hip joint: Secondary | ICD-10-CM | POA: Diagnosis not present

## 2020-04-06 DIAGNOSIS — M5126 Other intervertebral disc displacement, lumbar region: Secondary | ICD-10-CM | POA: Diagnosis not present

## 2020-04-06 DIAGNOSIS — M5430 Sciatica, unspecified side: Secondary | ICD-10-CM | POA: Diagnosis not present

## 2020-04-06 DIAGNOSIS — S72001D Fracture of unspecified part of neck of right femur, subsequent encounter for closed fracture with routine healing: Secondary | ICD-10-CM | POA: Diagnosis not present

## 2020-04-06 DIAGNOSIS — I10 Essential (primary) hypertension: Secondary | ICD-10-CM | POA: Diagnosis not present

## 2020-04-06 DIAGNOSIS — E782 Mixed hyperlipidemia: Secondary | ICD-10-CM | POA: Diagnosis not present

## 2020-04-06 DIAGNOSIS — M4800 Spinal stenosis, site unspecified: Secondary | ICD-10-CM | POA: Diagnosis not present

## 2020-04-06 DIAGNOSIS — M810 Age-related osteoporosis without current pathological fracture: Secondary | ICD-10-CM | POA: Diagnosis not present

## 2020-04-06 DIAGNOSIS — I2510 Atherosclerotic heart disease of native coronary artery without angina pectoris: Secondary | ICD-10-CM | POA: Diagnosis not present

## 2020-04-08 DIAGNOSIS — M810 Age-related osteoporosis without current pathological fracture: Secondary | ICD-10-CM | POA: Diagnosis not present

## 2020-04-08 DIAGNOSIS — I10 Essential (primary) hypertension: Secondary | ICD-10-CM | POA: Diagnosis not present

## 2020-04-08 DIAGNOSIS — M5430 Sciatica, unspecified side: Secondary | ICD-10-CM | POA: Diagnosis not present

## 2020-04-08 DIAGNOSIS — M5126 Other intervertebral disc displacement, lumbar region: Secondary | ICD-10-CM | POA: Diagnosis not present

## 2020-04-08 DIAGNOSIS — Z96641 Presence of right artificial hip joint: Secondary | ICD-10-CM | POA: Diagnosis not present

## 2020-04-08 DIAGNOSIS — M4800 Spinal stenosis, site unspecified: Secondary | ICD-10-CM | POA: Diagnosis not present

## 2020-04-08 DIAGNOSIS — E782 Mixed hyperlipidemia: Secondary | ICD-10-CM | POA: Diagnosis not present

## 2020-04-08 DIAGNOSIS — I2510 Atherosclerotic heart disease of native coronary artery without angina pectoris: Secondary | ICD-10-CM | POA: Diagnosis not present

## 2020-04-08 DIAGNOSIS — M159 Polyosteoarthritis, unspecified: Secondary | ICD-10-CM | POA: Diagnosis not present

## 2020-04-08 DIAGNOSIS — S72001D Fracture of unspecified part of neck of right femur, subsequent encounter for closed fracture with routine healing: Secondary | ICD-10-CM | POA: Diagnosis not present

## 2020-04-09 DIAGNOSIS — S72001D Fracture of unspecified part of neck of right femur, subsequent encounter for closed fracture with routine healing: Secondary | ICD-10-CM | POA: Diagnosis not present

## 2020-04-09 DIAGNOSIS — M810 Age-related osteoporosis without current pathological fracture: Secondary | ICD-10-CM | POA: Diagnosis not present

## 2020-04-09 DIAGNOSIS — M159 Polyosteoarthritis, unspecified: Secondary | ICD-10-CM | POA: Diagnosis not present

## 2020-04-09 DIAGNOSIS — I10 Essential (primary) hypertension: Secondary | ICD-10-CM | POA: Diagnosis not present

## 2020-04-09 DIAGNOSIS — M4800 Spinal stenosis, site unspecified: Secondary | ICD-10-CM | POA: Diagnosis not present

## 2020-04-09 DIAGNOSIS — M5126 Other intervertebral disc displacement, lumbar region: Secondary | ICD-10-CM | POA: Diagnosis not present

## 2020-04-09 DIAGNOSIS — I2510 Atherosclerotic heart disease of native coronary artery without angina pectoris: Secondary | ICD-10-CM | POA: Diagnosis not present

## 2020-04-09 DIAGNOSIS — Z96641 Presence of right artificial hip joint: Secondary | ICD-10-CM | POA: Diagnosis not present

## 2020-04-09 DIAGNOSIS — E782 Mixed hyperlipidemia: Secondary | ICD-10-CM | POA: Diagnosis not present

## 2020-04-09 DIAGNOSIS — M5430 Sciatica, unspecified side: Secondary | ICD-10-CM | POA: Diagnosis not present

## 2020-04-13 DIAGNOSIS — Z0000 Encounter for general adult medical examination without abnormal findings: Secondary | ICD-10-CM | POA: Diagnosis not present

## 2020-04-13 DIAGNOSIS — Z1389 Encounter for screening for other disorder: Secondary | ICD-10-CM | POA: Diagnosis not present

## 2020-04-14 DIAGNOSIS — M159 Polyosteoarthritis, unspecified: Secondary | ICD-10-CM | POA: Diagnosis not present

## 2020-04-14 DIAGNOSIS — M4800 Spinal stenosis, site unspecified: Secondary | ICD-10-CM | POA: Diagnosis not present

## 2020-04-14 DIAGNOSIS — E782 Mixed hyperlipidemia: Secondary | ICD-10-CM | POA: Diagnosis not present

## 2020-04-14 DIAGNOSIS — M5430 Sciatica, unspecified side: Secondary | ICD-10-CM | POA: Diagnosis not present

## 2020-04-14 DIAGNOSIS — I10 Essential (primary) hypertension: Secondary | ICD-10-CM | POA: Diagnosis not present

## 2020-04-14 DIAGNOSIS — M810 Age-related osteoporosis without current pathological fracture: Secondary | ICD-10-CM | POA: Diagnosis not present

## 2020-04-14 DIAGNOSIS — I2510 Atherosclerotic heart disease of native coronary artery without angina pectoris: Secondary | ICD-10-CM | POA: Diagnosis not present

## 2020-04-14 DIAGNOSIS — S72001D Fracture of unspecified part of neck of right femur, subsequent encounter for closed fracture with routine healing: Secondary | ICD-10-CM | POA: Diagnosis not present

## 2020-04-14 DIAGNOSIS — Z96641 Presence of right artificial hip joint: Secondary | ICD-10-CM | POA: Diagnosis not present

## 2020-04-14 DIAGNOSIS — M5126 Other intervertebral disc displacement, lumbar region: Secondary | ICD-10-CM | POA: Diagnosis not present

## 2020-04-15 ENCOUNTER — Ambulatory Visit: Payer: Medicare HMO | Admitting: Cardiology

## 2020-04-16 DIAGNOSIS — S72001D Fracture of unspecified part of neck of right femur, subsequent encounter for closed fracture with routine healing: Secondary | ICD-10-CM | POA: Diagnosis not present

## 2020-04-16 DIAGNOSIS — Z96641 Presence of right artificial hip joint: Secondary | ICD-10-CM | POA: Diagnosis not present

## 2020-04-16 DIAGNOSIS — M4800 Spinal stenosis, site unspecified: Secondary | ICD-10-CM | POA: Diagnosis not present

## 2020-04-16 DIAGNOSIS — I10 Essential (primary) hypertension: Secondary | ICD-10-CM | POA: Diagnosis not present

## 2020-04-16 DIAGNOSIS — I2510 Atherosclerotic heart disease of native coronary artery without angina pectoris: Secondary | ICD-10-CM | POA: Diagnosis not present

## 2020-04-16 DIAGNOSIS — M5430 Sciatica, unspecified side: Secondary | ICD-10-CM | POA: Diagnosis not present

## 2020-04-16 DIAGNOSIS — M810 Age-related osteoporosis without current pathological fracture: Secondary | ICD-10-CM | POA: Diagnosis not present

## 2020-04-16 DIAGNOSIS — M5126 Other intervertebral disc displacement, lumbar region: Secondary | ICD-10-CM | POA: Diagnosis not present

## 2020-04-16 DIAGNOSIS — M159 Polyosteoarthritis, unspecified: Secondary | ICD-10-CM | POA: Diagnosis not present

## 2020-04-16 DIAGNOSIS — E782 Mixed hyperlipidemia: Secondary | ICD-10-CM | POA: Diagnosis not present

## 2020-04-19 DIAGNOSIS — Z96641 Presence of right artificial hip joint: Secondary | ICD-10-CM | POA: Diagnosis not present

## 2020-04-19 DIAGNOSIS — M5126 Other intervertebral disc displacement, lumbar region: Secondary | ICD-10-CM | POA: Diagnosis not present

## 2020-04-19 DIAGNOSIS — M5430 Sciatica, unspecified side: Secondary | ICD-10-CM | POA: Diagnosis not present

## 2020-04-19 DIAGNOSIS — M810 Age-related osteoporosis without current pathological fracture: Secondary | ICD-10-CM | POA: Diagnosis not present

## 2020-04-19 DIAGNOSIS — M4800 Spinal stenosis, site unspecified: Secondary | ICD-10-CM | POA: Diagnosis not present

## 2020-04-19 DIAGNOSIS — M159 Polyosteoarthritis, unspecified: Secondary | ICD-10-CM | POA: Diagnosis not present

## 2020-04-19 DIAGNOSIS — E782 Mixed hyperlipidemia: Secondary | ICD-10-CM | POA: Diagnosis not present

## 2020-04-19 DIAGNOSIS — I2510 Atherosclerotic heart disease of native coronary artery without angina pectoris: Secondary | ICD-10-CM | POA: Diagnosis not present

## 2020-04-19 DIAGNOSIS — I10 Essential (primary) hypertension: Secondary | ICD-10-CM | POA: Diagnosis not present

## 2020-04-19 DIAGNOSIS — S72001D Fracture of unspecified part of neck of right femur, subsequent encounter for closed fracture with routine healing: Secondary | ICD-10-CM | POA: Diagnosis not present

## 2020-04-23 DIAGNOSIS — M5126 Other intervertebral disc displacement, lumbar region: Secondary | ICD-10-CM | POA: Diagnosis not present

## 2020-04-23 DIAGNOSIS — S72001D Fracture of unspecified part of neck of right femur, subsequent encounter for closed fracture with routine healing: Secondary | ICD-10-CM | POA: Diagnosis not present

## 2020-04-23 DIAGNOSIS — Z96641 Presence of right artificial hip joint: Secondary | ICD-10-CM | POA: Diagnosis not present

## 2020-04-23 DIAGNOSIS — M4800 Spinal stenosis, site unspecified: Secondary | ICD-10-CM | POA: Diagnosis not present

## 2020-04-23 DIAGNOSIS — I2510 Atherosclerotic heart disease of native coronary artery without angina pectoris: Secondary | ICD-10-CM | POA: Diagnosis not present

## 2020-04-23 DIAGNOSIS — M159 Polyosteoarthritis, unspecified: Secondary | ICD-10-CM | POA: Diagnosis not present

## 2020-04-23 DIAGNOSIS — M810 Age-related osteoporosis without current pathological fracture: Secondary | ICD-10-CM | POA: Diagnosis not present

## 2020-04-23 DIAGNOSIS — M5430 Sciatica, unspecified side: Secondary | ICD-10-CM | POA: Diagnosis not present

## 2020-04-23 DIAGNOSIS — E782 Mixed hyperlipidemia: Secondary | ICD-10-CM | POA: Diagnosis not present

## 2020-04-23 DIAGNOSIS — I10 Essential (primary) hypertension: Secondary | ICD-10-CM | POA: Diagnosis not present

## 2020-04-27 DIAGNOSIS — S72001D Fracture of unspecified part of neck of right femur, subsequent encounter for closed fracture with routine healing: Secondary | ICD-10-CM | POA: Diagnosis not present

## 2020-04-27 DIAGNOSIS — M5430 Sciatica, unspecified side: Secondary | ICD-10-CM | POA: Diagnosis not present

## 2020-04-27 DIAGNOSIS — Z96641 Presence of right artificial hip joint: Secondary | ICD-10-CM | POA: Diagnosis not present

## 2020-04-27 DIAGNOSIS — M810 Age-related osteoporosis without current pathological fracture: Secondary | ICD-10-CM | POA: Diagnosis not present

## 2020-04-27 DIAGNOSIS — I10 Essential (primary) hypertension: Secondary | ICD-10-CM | POA: Diagnosis not present

## 2020-04-27 DIAGNOSIS — I2510 Atherosclerotic heart disease of native coronary artery without angina pectoris: Secondary | ICD-10-CM | POA: Diagnosis not present

## 2020-04-27 DIAGNOSIS — M4800 Spinal stenosis, site unspecified: Secondary | ICD-10-CM | POA: Diagnosis not present

## 2020-04-27 DIAGNOSIS — M5126 Other intervertebral disc displacement, lumbar region: Secondary | ICD-10-CM | POA: Diagnosis not present

## 2020-04-27 DIAGNOSIS — E782 Mixed hyperlipidemia: Secondary | ICD-10-CM | POA: Diagnosis not present

## 2020-04-27 DIAGNOSIS — M159 Polyosteoarthritis, unspecified: Secondary | ICD-10-CM | POA: Diagnosis not present

## 2020-05-03 DIAGNOSIS — S72031D Displaced midcervical fracture of right femur, subsequent encounter for closed fracture with routine healing: Secondary | ICD-10-CM | POA: Diagnosis not present

## 2020-05-04 DIAGNOSIS — M5430 Sciatica, unspecified side: Secondary | ICD-10-CM | POA: Diagnosis not present

## 2020-05-04 DIAGNOSIS — Z96641 Presence of right artificial hip joint: Secondary | ICD-10-CM | POA: Diagnosis not present

## 2020-05-04 DIAGNOSIS — M159 Polyosteoarthritis, unspecified: Secondary | ICD-10-CM | POA: Diagnosis not present

## 2020-05-04 DIAGNOSIS — M5126 Other intervertebral disc displacement, lumbar region: Secondary | ICD-10-CM | POA: Diagnosis not present

## 2020-05-04 DIAGNOSIS — S72001D Fracture of unspecified part of neck of right femur, subsequent encounter for closed fracture with routine healing: Secondary | ICD-10-CM | POA: Diagnosis not present

## 2020-05-04 DIAGNOSIS — I2510 Atherosclerotic heart disease of native coronary artery without angina pectoris: Secondary | ICD-10-CM | POA: Diagnosis not present

## 2020-05-04 DIAGNOSIS — M4800 Spinal stenosis, site unspecified: Secondary | ICD-10-CM | POA: Diagnosis not present

## 2020-05-04 DIAGNOSIS — M810 Age-related osteoporosis without current pathological fracture: Secondary | ICD-10-CM | POA: Diagnosis not present

## 2020-05-04 DIAGNOSIS — E782 Mixed hyperlipidemia: Secondary | ICD-10-CM | POA: Diagnosis not present

## 2020-05-04 DIAGNOSIS — I10 Essential (primary) hypertension: Secondary | ICD-10-CM | POA: Diagnosis not present

## 2020-05-28 DIAGNOSIS — M810 Age-related osteoporosis without current pathological fracture: Secondary | ICD-10-CM | POA: Diagnosis not present

## 2020-05-28 DIAGNOSIS — I214 Non-ST elevation (NSTEMI) myocardial infarction: Secondary | ICD-10-CM | POA: Diagnosis not present

## 2020-05-28 DIAGNOSIS — N183 Chronic kidney disease, stage 3 unspecified: Secondary | ICD-10-CM | POA: Diagnosis not present

## 2020-05-28 DIAGNOSIS — E782 Mixed hyperlipidemia: Secondary | ICD-10-CM | POA: Diagnosis not present

## 2020-05-28 DIAGNOSIS — N1832 Chronic kidney disease, stage 3b: Secondary | ICD-10-CM | POA: Diagnosis not present

## 2020-05-28 DIAGNOSIS — M1711 Unilateral primary osteoarthritis, right knee: Secondary | ICD-10-CM | POA: Diagnosis not present

## 2020-05-28 DIAGNOSIS — I10 Essential (primary) hypertension: Secondary | ICD-10-CM | POA: Diagnosis not present

## 2020-05-31 NOTE — Telephone Encounter (Signed)
From patient.

## 2020-05-31 NOTE — Telephone Encounter (Signed)
Either that or virtual visit. Either fine with me.  Thanks MJP

## 2020-06-03 ENCOUNTER — Ambulatory Visit: Payer: Medicare HMO

## 2020-06-03 ENCOUNTER — Other Ambulatory Visit: Payer: Self-pay

## 2020-06-03 DIAGNOSIS — R600 Localized edema: Secondary | ICD-10-CM | POA: Diagnosis not present

## 2020-06-03 DIAGNOSIS — I2510 Atherosclerotic heart disease of native coronary artery without angina pectoris: Secondary | ICD-10-CM

## 2020-06-08 NOTE — Progress Notes (Signed)
Can do virtual visit 

## 2020-06-16 ENCOUNTER — Other Ambulatory Visit: Payer: Self-pay | Admitting: Cardiology

## 2020-06-16 DIAGNOSIS — R600 Localized edema: Secondary | ICD-10-CM

## 2020-06-16 DIAGNOSIS — I2510 Atherosclerotic heart disease of native coronary artery without angina pectoris: Secondary | ICD-10-CM

## 2020-06-17 NOTE — Telephone Encounter (Signed)
Okay to hold for now. I will see her virtually, if not in person, at her convenience. If I feel she should be on it, I will then prescribe it again.  Thanks MJP

## 2020-06-22 ENCOUNTER — Other Ambulatory Visit: Payer: Self-pay | Admitting: Cardiology

## 2020-06-22 ENCOUNTER — Telehealth: Payer: Self-pay

## 2020-06-22 DIAGNOSIS — Z880 Allergy status to penicillin: Secondary | ICD-10-CM | POA: Diagnosis not present

## 2020-06-22 DIAGNOSIS — I10 Essential (primary) hypertension: Secondary | ICD-10-CM | POA: Diagnosis not present

## 2020-06-22 DIAGNOSIS — I252 Old myocardial infarction: Secondary | ICD-10-CM | POA: Diagnosis not present

## 2020-06-22 DIAGNOSIS — M810 Age-related osteoporosis without current pathological fracture: Secondary | ICD-10-CM | POA: Diagnosis not present

## 2020-06-22 DIAGNOSIS — E785 Hyperlipidemia, unspecified: Secondary | ICD-10-CM | POA: Diagnosis not present

## 2020-06-22 DIAGNOSIS — Z7982 Long term (current) use of aspirin: Secondary | ICD-10-CM | POA: Diagnosis not present

## 2020-06-22 DIAGNOSIS — Z882 Allergy status to sulfonamides status: Secondary | ICD-10-CM | POA: Diagnosis not present

## 2020-06-22 DIAGNOSIS — Z87892 Personal history of anaphylaxis: Secondary | ICD-10-CM | POA: Diagnosis not present

## 2020-06-22 DIAGNOSIS — I272 Pulmonary hypertension, unspecified: Secondary | ICD-10-CM

## 2020-06-22 DIAGNOSIS — R609 Edema, unspecified: Secondary | ICD-10-CM | POA: Diagnosis not present

## 2020-06-22 DIAGNOSIS — I2510 Atherosclerotic heart disease of native coronary artery without angina pectoris: Secondary | ICD-10-CM | POA: Diagnosis not present

## 2020-06-22 NOTE — Telephone Encounter (Signed)
Attempted to call pt, no answer. Left vm requesting call back.

## 2020-06-22 NOTE — Telephone Encounter (Signed)
Mild increase in right sided heart pressures. This could be due to any long standing lung issues, sleep apnea, or could be primarily due to increased resistance in lung vessels. If no new shortness of breath, could monitor this for now. Recommend repeat echocardiogram in 6 months.  Staff, please arrange repeat echo in 6 months. Order placed.  Thanks MJP

## 2020-06-22 NOTE — Telephone Encounter (Signed)
Called and informed pt.  

## 2020-06-22 NOTE — Telephone Encounter (Signed)
Pt called requesting echo results. Pt has just been scheduled for a virtual office visit as stated in her echo notes.

## 2020-06-28 DIAGNOSIS — M1711 Unilateral primary osteoarthritis, right knee: Secondary | ICD-10-CM | POA: Diagnosis not present

## 2020-07-16 ENCOUNTER — Other Ambulatory Visit: Payer: Self-pay

## 2020-07-16 ENCOUNTER — Ambulatory Visit: Payer: Medicare HMO | Admitting: Cardiology

## 2020-07-16 ENCOUNTER — Encounter: Payer: Self-pay | Admitting: Cardiology

## 2020-07-16 VITALS — BP 150/75 | HR 64 | Temp 98.3°F | Resp 17 | Ht 61.0 in | Wt 120.0 lb

## 2020-07-16 DIAGNOSIS — I10 Essential (primary) hypertension: Secondary | ICD-10-CM

## 2020-07-16 DIAGNOSIS — I2510 Atherosclerotic heart disease of native coronary artery without angina pectoris: Secondary | ICD-10-CM | POA: Diagnosis not present

## 2020-07-16 DIAGNOSIS — E782 Mixed hyperlipidemia: Secondary | ICD-10-CM | POA: Diagnosis not present

## 2020-07-16 DIAGNOSIS — I272 Pulmonary hypertension, unspecified: Secondary | ICD-10-CM

## 2020-07-16 NOTE — Progress Notes (Signed)
Follow up visit  Subjective:   Tracy Rogers, female    DOB: 04-Aug-1930, 87 y.o.   MRN: [id number]   Chief Complaint  Patient presents with  . Coronary Artery Disease  . Hypertension  . Follow-up  . Results    echo    HPI  87 y.o. Caucasian female with hypertension, hyperlipidemia, CKD3, arthritis, CAD, NSTEMI 09/2018, here on 6 month follow up for CAD.   Since her last visit, she has had elbow and hip fractures, followed by hip and knee bursitis. She has had mild stable anemia. She denies chest pain, shortness of breath, palpitations, leg edema, orthopnea, PND, TIA/syncope. She is going to have blood work checked soon with Dr. Jacelyn Grip, where she wants Lp(a) also checked. BP is elevated today, but usually much better controlled at home.   Current Outpatient Medications on File Prior to Visit  Medication Sig Dispense Refill  . amLODipine (NORVASC) 5 MG tablet Take 2.5-5 mg by mouth See admin instructions. Takes 68m by mouth in the morning and 2.5 mg in the evening    . Ascorbic Acid (VITA-C PO) Take 1,000 mg by mouth daily.    .Marland Kitchenaspirin EC 81 MG tablet Take 81 mg by mouth daily. Swallow whole.    . Bilberry 1000 MG CAPS Take 1,000 mg by mouth in the morning and at bedtime.     . Cholecalciferol (VITAMIN D3) 50 MCG (2000 UT) capsule Take 2,000 Units by mouth daily.    . Coenzyme Q10 (COQ10) 200 MG CAPS Take 200 mg by mouth in the morning and at bedtime.    . diphenhydramine-acetaminophen (TYLENOL PM) 25-500 MG TABS tablet Take 1 tablet by mouth at bedtime as needed (sleep/pain).    .Marland KitchenELDERBERRY PO Take 1 tablet by mouth in the morning and at bedtime.     . furosemide (LASIX) 20 MG tablet Take 1 tablet by mouth daily at 6 (six) AM.    . GARLIC 16073PO Take 17,106mg by mouth in the morning and at bedtime.    .Marland KitchenLECITHIN PO Take 1 tablet by mouth daily.     . Lutein 40 MG CAPS Take 40 mg by mouth daily.    . magnesium oxide (MAG-OX) 400 MG tablet Take 400 mg by mouth daily.    .  metoprolol tartrate (LOPRESSOR) 25 MG tablet TAKE 1 TABLET BY MOUTH TWICE A DAY (Patient taking differently: Take 25 mg by mouth 2 (two) times daily.) 180 tablet 0  . Multiple Vitamin (MULTIVITAMIN) capsule Take 1 capsule by mouth daily.    . nitroGLYCERIN (NITROSTAT) 0.4 MG SL tablet Place 0.4 mg under the tongue every 5 (five) minutes as needed for chest pain.    . pantoprazole (PROTONIX) 40 MG tablet TAKE 1 TABLET BY MOUTH EVERY DAY 90 tablet 0  . rosuvastatin (CRESTOR) 10 MG tablet TAKE 1 TABLET BY MOUTH EVERY DAY 90 tablet 1  . traMADol (ULTRAM) 50 MG tablet Take 1 tablet (50 mg total) by mouth every 6 (six) hours as needed. 40 tablet 0  . Turmeric (QC TUMERIC COMPLEX PO) Take 1,000 tablets by mouth daily at 6 (six) AM.    . Zinc 25 MG TABS Take 25 mg by mouth daily.     No current facility-administered medications on file prior to visit.    Cardiovascular studies:  EKG 03/17/2020: Sinus rhythm 75 bpm Left atrial enlargement Left anterior fascicular block Possible old posterior infarct  Echocardiogram 06/03/2020:  Normal LV systolic function with visual  EF 60-65%. Left ventricle cavity  is normal in size. Normal global wall motion. Indeterminate diastolic  filling pattern, normal LAP.  Mild tricuspid regurgitation. Mild pulmonary hypertension. RVSP measures  43 mmHg.  IVC is normal with a respiratory response of <50%.  Compared to prior study dated 10/25/2018: Mild TR and PHTN are new  findings. Otherwise no significant change.   Coronary angiography and intervention 10/24/2018: LM: Normal LAD: Minimal luminal irregularities Diag1 LCx: 100% prox LCx occlusion. Mid 80% stenosis. RCA: Minimal luminal irregularities RPL  Successful percutaneous coronary intervention Prox-mid LCx PTCA and overlapping stents Synergy DES 3.0 X 28 mm &Synergy DES 2.5 X 8 mm   Echocardiogram 10/25/2018: LVEF 60-65%. Mild hypokinesis of the left ventricular, mid-apical inferolateral wall.  Grade 1 DD Mild LA dilatation. Moderate MAC. Trace AI RVSP 27 mmHg  Recent labs: 03/23/2020: Glucose 115, BUN/Cr 28/1.0. EGFR 55. Na/K 140/3.7.  H/H 9.3/28.2. MCV 95.9. Platelets 243  09/25/2019: Glucose 123, BUN/Cr 41/1.31. EGFR 37. Na/K 135/3.6.  H/H 10/34. MCV 95. Platelets 207  05/2019: Glucose 103, BUN/Cr 28/1.28. EGFR 43. Na/K 141/4.4. Rest of the CMP normal Chol 161, TG 169, HDL 59, LDL 74  Lipid panel 08/20/2018: Chol 229,TG121, HDL 75, LDL 129.   10/25/2018:  H/H 13.7/41. MCV 94. Platelets 306  09/2013: TSH 2.552 normal   Review of Systems  Cardiovascular: Negative for chest pain, dyspnea on exertion, leg swelling, palpitations and syncope.        Vitals:   07/16/20 1032 07/16/20 1038  BP: (!) 162/76 (!) 150/75  Pulse: 63 64  Resp: 17   Temp: 98.3 F (36.8 C)   SpO2: 98% 99%     Body mass index is 22.67 kg/m. Filed Weights   07/16/20 1032  Weight: 120 lb (54.4 kg)     Objective:   Physical Exam Vitals and nursing note reviewed.  Constitutional:      General: She is not in acute distress. Neck:     Vascular: No JVD.  Cardiovascular:     Rate and Rhythm: Normal rate and regular rhythm.     Pulses: Intact distal pulses.     Heart sounds: Normal heart sounds. No murmur heard.   Pulmonary:     Effort: Pulmonary effort is normal.     Breath sounds: Normal breath sounds. No wheezing or rales.        Assessment & Recommendations:    87 y.o. Caucasian female with hypertension, hyperlipidemia, CKD3, arthritis, CAD, NSTEMI 09/2018  CAD: NSTEMI 09/2018 fromSubacute LCx occlusion treated with complex PCI (overlapping stents Synergy DES 3.0 X 28 mm &Synergy DES 2.5 X 8 mm). No recurrent anginal symptoms.  Continue Aspirin 81 mg, metoprolol 25 mg twice daily. Continue crestor 10 mg, with which she has had remarkable improvement in her LDL. Upcoming lipid panel and Lp(a) with PCP next month  PH: Mild, clinically asymptomatic. PASP 43  mmHg. Repeat echocardiogram in 1 year  F/u in 1 year  Nigel Mormon, MD [facility] Pager: [phone] Office: [phone]

## 2020-07-18 ENCOUNTER — Other Ambulatory Visit: Payer: Self-pay | Admitting: Cardiology

## 2020-08-03 DIAGNOSIS — I10 Essential (primary) hypertension: Secondary | ICD-10-CM | POA: Diagnosis not present

## 2020-08-03 DIAGNOSIS — N183 Chronic kidney disease, stage 3 unspecified: Secondary | ICD-10-CM | POA: Diagnosis not present

## 2020-08-03 DIAGNOSIS — M810 Age-related osteoporosis without current pathological fracture: Secondary | ICD-10-CM | POA: Diagnosis not present

## 2020-08-03 DIAGNOSIS — E782 Mixed hyperlipidemia: Secondary | ICD-10-CM | POA: Diagnosis not present

## 2020-08-03 DIAGNOSIS — N1832 Chronic kidney disease, stage 3b: Secondary | ICD-10-CM | POA: Diagnosis not present

## 2020-08-03 DIAGNOSIS — I214 Non-ST elevation (NSTEMI) myocardial infarction: Secondary | ICD-10-CM | POA: Diagnosis not present

## 2020-08-03 DIAGNOSIS — M1711 Unilateral primary osteoarthritis, right knee: Secondary | ICD-10-CM | POA: Diagnosis not present

## 2020-08-13 NOTE — Telephone Encounter (Signed)
Pt from

## 2020-08-13 NOTE — Telephone Encounter (Signed)
Yes please. Please send 40 mg tablets 90 pillsX2 refills. F?u in 2 months if swelling does not decrease.  Thanks MJP

## 2020-08-16 ENCOUNTER — Other Ambulatory Visit: Payer: Self-pay

## 2020-08-16 ENCOUNTER — Other Ambulatory Visit: Payer: Self-pay | Admitting: Cardiology

## 2020-08-16 DIAGNOSIS — R600 Localized edema: Secondary | ICD-10-CM

## 2020-08-16 MED ORDER — FUROSEMIDE 40 MG PO TABS: 20.0000 mg | ORAL_TABLET | Freq: Every day | ORAL | 3 refills | Status: DC

## 2020-08-16 NOTE — Telephone Encounter (Signed)
From pt

## 2020-08-16 NOTE — Telephone Encounter (Signed)
Morning better time to take lasix.  Let us do venous duplex ultrasound to rule put DVT.  Staff, please arrange.   Thanks MJP

## 2020-08-18 ENCOUNTER — Other Ambulatory Visit: Payer: Self-pay | Admitting: Cardiology

## 2020-08-18 DIAGNOSIS — R600 Localized edema: Secondary | ICD-10-CM

## 2020-08-19 ENCOUNTER — Other Ambulatory Visit: Payer: Self-pay

## 2020-08-19 ENCOUNTER — Ambulatory Visit: Payer: Medicare HMO

## 2020-08-19 DIAGNOSIS — R600 Localized edema: Secondary | ICD-10-CM | POA: Diagnosis not present

## 2020-08-24 NOTE — Progress Notes (Signed)
Called and spoke with patient regarding her LEVD results. She stated that she is feeling better and the swelling has gone down. She will call to make an appoinment if her symptoms persist.

## 2020-08-26 DIAGNOSIS — H40013 Open angle with borderline findings, low risk, bilateral: Secondary | ICD-10-CM | POA: Diagnosis not present

## 2020-09-09 DIAGNOSIS — E559 Vitamin D deficiency, unspecified: Secondary | ICD-10-CM | POA: Diagnosis not present

## 2020-09-09 DIAGNOSIS — Z955 Presence of coronary angioplasty implant and graft: Secondary | ICD-10-CM | POA: Diagnosis not present

## 2020-09-09 DIAGNOSIS — N1832 Chronic kidney disease, stage 3b: Secondary | ICD-10-CM | POA: Diagnosis not present

## 2020-09-09 DIAGNOSIS — M48061 Spinal stenosis, lumbar region without neurogenic claudication: Secondary | ICD-10-CM | POA: Diagnosis not present

## 2020-09-09 DIAGNOSIS — I10 Essential (primary) hypertension: Secondary | ICD-10-CM | POA: Diagnosis not present

## 2020-09-09 DIAGNOSIS — R600 Localized edema: Secondary | ICD-10-CM | POA: Diagnosis not present

## 2020-09-09 DIAGNOSIS — M810 Age-related osteoporosis without current pathological fracture: Secondary | ICD-10-CM | POA: Diagnosis not present

## 2020-09-09 DIAGNOSIS — R6889 Other general symptoms and signs: Secondary | ICD-10-CM | POA: Diagnosis not present

## 2020-09-09 DIAGNOSIS — E782 Mixed hyperlipidemia: Secondary | ICD-10-CM | POA: Diagnosis not present

## 2020-09-13 DIAGNOSIS — H40053 Ocular hypertension, bilateral: Secondary | ICD-10-CM | POA: Diagnosis not present

## 2020-09-13 DIAGNOSIS — H40013 Open angle with borderline findings, low risk, bilateral: Secondary | ICD-10-CM | POA: Diagnosis not present

## 2020-10-07 ENCOUNTER — Other Ambulatory Visit: Payer: Self-pay

## 2020-10-07 MED ORDER — ASPIRIN EC 81 MG PO TBEC: 81.0000 mg | DELAYED_RELEASE_TABLET | Freq: Every day | ORAL | 3 refills | Status: DC

## 2020-10-11 DIAGNOSIS — E559 Vitamin D deficiency, unspecified: Secondary | ICD-10-CM | POA: Diagnosis not present

## 2020-10-11 DIAGNOSIS — I10 Essential (primary) hypertension: Secondary | ICD-10-CM | POA: Diagnosis not present

## 2020-10-11 DIAGNOSIS — M810 Age-related osteoporosis without current pathological fracture: Secondary | ICD-10-CM | POA: Diagnosis not present

## 2020-10-11 DIAGNOSIS — R899 Unspecified abnormal finding in specimens from other organs, systems and tissues: Secondary | ICD-10-CM | POA: Diagnosis not present

## 2020-10-11 DIAGNOSIS — E782 Mixed hyperlipidemia: Secondary | ICD-10-CM | POA: Diagnosis not present

## 2020-10-14 DIAGNOSIS — N1832 Chronic kidney disease, stage 3b: Secondary | ICD-10-CM | POA: Diagnosis not present

## 2020-10-14 DIAGNOSIS — E782 Mixed hyperlipidemia: Secondary | ICD-10-CM | POA: Diagnosis not present

## 2020-10-14 DIAGNOSIS — I10 Essential (primary) hypertension: Secondary | ICD-10-CM | POA: Diagnosis not present

## 2020-10-14 DIAGNOSIS — M810 Age-related osteoporosis without current pathological fracture: Secondary | ICD-10-CM | POA: Diagnosis not present

## 2020-10-14 DIAGNOSIS — J3489 Other specified disorders of nose and nasal sinuses: Secondary | ICD-10-CM | POA: Diagnosis not present

## 2020-10-14 DIAGNOSIS — D692 Other nonthrombocytopenic purpura: Secondary | ICD-10-CM | POA: Diagnosis not present

## 2020-10-14 DIAGNOSIS — E559 Vitamin D deficiency, unspecified: Secondary | ICD-10-CM | POA: Diagnosis not present

## 2020-10-17 ENCOUNTER — Other Ambulatory Visit: Payer: Self-pay | Admitting: Cardiology

## 2020-11-02 DIAGNOSIS — C44311 Basal cell carcinoma of skin of nose: Secondary | ICD-10-CM | POA: Diagnosis not present

## 2020-12-27 ENCOUNTER — Other Ambulatory Visit: Payer: Medicare HMO

## 2021-01-04 DIAGNOSIS — I10 Essential (primary) hypertension: Secondary | ICD-10-CM | POA: Diagnosis not present

## 2021-01-04 DIAGNOSIS — I214 Non-ST elevation (NSTEMI) myocardial infarction: Secondary | ICD-10-CM | POA: Diagnosis not present

## 2021-01-04 DIAGNOSIS — N1832 Chronic kidney disease, stage 3b: Secondary | ICD-10-CM | POA: Diagnosis not present

## 2021-01-04 DIAGNOSIS — M1711 Unilateral primary osteoarthritis, right knee: Secondary | ICD-10-CM | POA: Diagnosis not present

## 2021-01-04 DIAGNOSIS — E782 Mixed hyperlipidemia: Secondary | ICD-10-CM | POA: Diagnosis not present

## 2021-01-04 DIAGNOSIS — M810 Age-related osteoporosis without current pathological fracture: Secondary | ICD-10-CM | POA: Diagnosis not present

## 2021-02-14 DIAGNOSIS — C44311 Basal cell carcinoma of skin of nose: Secondary | ICD-10-CM | POA: Diagnosis not present

## 2021-02-14 DIAGNOSIS — Z85828 Personal history of other malignant neoplasm of skin: Secondary | ICD-10-CM | POA: Diagnosis not present

## 2021-02-15 DIAGNOSIS — N1832 Chronic kidney disease, stage 3b: Secondary | ICD-10-CM | POA: Diagnosis not present

## 2021-02-15 DIAGNOSIS — M810 Age-related osteoporosis without current pathological fracture: Secondary | ICD-10-CM | POA: Diagnosis not present

## 2021-02-15 DIAGNOSIS — E559 Vitamin D deficiency, unspecified: Secondary | ICD-10-CM | POA: Diagnosis not present

## 2021-02-15 DIAGNOSIS — D692 Other nonthrombocytopenic purpura: Secondary | ICD-10-CM | POA: Diagnosis not present

## 2021-02-15 DIAGNOSIS — J3489 Other specified disorders of nose and nasal sinuses: Secondary | ICD-10-CM | POA: Diagnosis not present

## 2021-02-15 DIAGNOSIS — E782 Mixed hyperlipidemia: Secondary | ICD-10-CM | POA: Diagnosis not present

## 2021-02-15 DIAGNOSIS — I10 Essential (primary) hypertension: Secondary | ICD-10-CM | POA: Diagnosis not present

## 2021-02-16 DIAGNOSIS — E559 Vitamin D deficiency, unspecified: Secondary | ICD-10-CM | POA: Diagnosis not present

## 2021-02-16 DIAGNOSIS — E782 Mixed hyperlipidemia: Secondary | ICD-10-CM | POA: Diagnosis not present

## 2021-02-16 DIAGNOSIS — N1832 Chronic kidney disease, stage 3b: Secondary | ICD-10-CM | POA: Diagnosis not present

## 2021-04-03 ENCOUNTER — Other Ambulatory Visit: Payer: Self-pay | Admitting: Cardiology

## 2021-04-15 DIAGNOSIS — Z1389 Encounter for screening for other disorder: Secondary | ICD-10-CM | POA: Diagnosis not present

## 2021-04-15 DIAGNOSIS — Z0000 Encounter for general adult medical examination without abnormal findings: Secondary | ICD-10-CM | POA: Diagnosis not present

## 2021-06-07 DIAGNOSIS — H52223 Regular astigmatism, bilateral: Secondary | ICD-10-CM | POA: Diagnosis not present

## 2021-06-07 DIAGNOSIS — H25813 Combined forms of age-related cataract, bilateral: Secondary | ICD-10-CM | POA: Diagnosis not present

## 2021-06-07 DIAGNOSIS — H524 Presbyopia: Secondary | ICD-10-CM | POA: Diagnosis not present

## 2021-06-16 DIAGNOSIS — R030 Elevated blood-pressure reading, without diagnosis of hypertension: Secondary | ICD-10-CM | POA: Diagnosis not present

## 2021-06-16 DIAGNOSIS — M25472 Effusion, left ankle: Secondary | ICD-10-CM | POA: Diagnosis not present

## 2021-06-16 DIAGNOSIS — I10 Essential (primary) hypertension: Secondary | ICD-10-CM | POA: Diagnosis not present

## 2021-06-16 DIAGNOSIS — E782 Mixed hyperlipidemia: Secondary | ICD-10-CM | POA: Diagnosis not present

## 2021-06-16 DIAGNOSIS — E559 Vitamin D deficiency, unspecified: Secondary | ICD-10-CM | POA: Diagnosis not present

## 2021-06-16 DIAGNOSIS — D692 Other nonthrombocytopenic purpura: Secondary | ICD-10-CM | POA: Diagnosis not present

## 2021-06-16 DIAGNOSIS — N1832 Chronic kidney disease, stage 3b: Secondary | ICD-10-CM | POA: Diagnosis not present

## 2021-06-16 DIAGNOSIS — M810 Age-related osteoporosis without current pathological fracture: Secondary | ICD-10-CM | POA: Diagnosis not present

## 2021-06-16 DIAGNOSIS — Z5181 Encounter for therapeutic drug level monitoring: Secondary | ICD-10-CM | POA: Diagnosis not present

## 2021-06-22 DIAGNOSIS — I25119 Atherosclerotic heart disease of native coronary artery with unspecified angina pectoris: Secondary | ICD-10-CM | POA: Diagnosis not present

## 2021-06-22 DIAGNOSIS — G8929 Other chronic pain: Secondary | ICD-10-CM | POA: Diagnosis not present

## 2021-06-22 DIAGNOSIS — Z008 Encounter for other general examination: Secondary | ICD-10-CM | POA: Diagnosis not present

## 2021-06-22 DIAGNOSIS — M810 Age-related osteoporosis without current pathological fracture: Secondary | ICD-10-CM | POA: Diagnosis not present

## 2021-06-22 DIAGNOSIS — E785 Hyperlipidemia, unspecified: Secondary | ICD-10-CM | POA: Diagnosis not present

## 2021-06-22 DIAGNOSIS — M1990 Unspecified osteoarthritis, unspecified site: Secondary | ICD-10-CM | POA: Diagnosis not present

## 2021-06-22 DIAGNOSIS — I252 Old myocardial infarction: Secondary | ICD-10-CM | POA: Diagnosis not present

## 2021-06-22 DIAGNOSIS — M5430 Sciatica, unspecified side: Secondary | ICD-10-CM | POA: Diagnosis not present

## 2021-06-22 DIAGNOSIS — Z96649 Presence of unspecified artificial hip joint: Secondary | ICD-10-CM | POA: Diagnosis not present

## 2021-06-22 DIAGNOSIS — I10 Essential (primary) hypertension: Secondary | ICD-10-CM | POA: Diagnosis not present

## 2021-07-15 ENCOUNTER — Other Ambulatory Visit: Payer: Medicare HMO

## 2021-07-15 ENCOUNTER — Ambulatory Visit: Payer: Medicare HMO

## 2021-07-15 DIAGNOSIS — R600 Localized edema: Secondary | ICD-10-CM

## 2021-07-20 DIAGNOSIS — I10 Essential (primary) hypertension: Secondary | ICD-10-CM | POA: Diagnosis not present

## 2021-07-20 DIAGNOSIS — M810 Age-related osteoporosis without current pathological fracture: Secondary | ICD-10-CM | POA: Diagnosis not present

## 2021-07-20 DIAGNOSIS — N1832 Chronic kidney disease, stage 3b: Secondary | ICD-10-CM | POA: Diagnosis not present

## 2021-07-20 DIAGNOSIS — E782 Mixed hyperlipidemia: Secondary | ICD-10-CM | POA: Diagnosis not present

## 2021-07-25 ENCOUNTER — Ambulatory Visit: Payer: Medicare HMO | Admitting: Cardiology

## 2021-07-25 ENCOUNTER — Encounter: Payer: Self-pay | Admitting: Cardiology

## 2021-07-25 VITALS — BP 157/74 | HR 67 | Temp 98.0°F | Resp 16 | Ht 61.0 in | Wt 121.0 lb

## 2021-07-25 DIAGNOSIS — I10 Essential (primary) hypertension: Secondary | ICD-10-CM | POA: Diagnosis not present

## 2021-07-25 DIAGNOSIS — E782 Mixed hyperlipidemia: Secondary | ICD-10-CM

## 2021-07-25 DIAGNOSIS — I2510 Atherosclerotic heart disease of native coronary artery without angina pectoris: Secondary | ICD-10-CM | POA: Diagnosis not present

## 2021-07-25 NOTE — Progress Notes (Signed)
? ?Follow up visit ? ?Subjective:  ? ?Tracy Rogers, female    DOB: [birth year], 88 y.o.   MRN: [id number] ? ? ?Chief Complaint  ?Patient presents with  ? Coronary Artery Disease  ? Follow-up  ? Results  ? ? ?HPI ? ?88 y.o. Caucasian female  with hypertension, hyperlipidemia, CKD3, arthritis, CAD, NSTEMI 09/2018. ? ?Patient is doing well from cardiac standpoint, denies chest pain, shortness of breath, palpitations, leg edema, orthopnea, PND, TIA/syncope.  Primary problem currently is sciatica.  She still able to get around with a walker or cane.  Blood pressure slightly elevated today, but usually well controlled at home.  Reviewed recent test results with the patient, details below.  ? ? ? ?Current Outpatient Medications:  ?  amLODipine (NORVASC) 5 MG tablet, Take 2.5-5 mg by mouth See admin instructions. Takes 44m by mouth in the morning and 2.5 mg in the evening, Disp: , Rfl:  ?  Ascorbic Acid (VITA-C PO), Take 1,000 mg by mouth daily., Disp: , Rfl:  ?  aspirin EC 81 MG tablet, Take 1 tablet (81 mg total) by mouth daily. Swallow whole., Disp: 90 tablet, Rfl: 3 ?  Bilberry 1000 MG CAPS, Take 1,000 mg by mouth in the morning and at bedtime. , Disp: , Rfl:  ?  Biotin 10000 MCG TABS, 1 tablet, Disp: , Rfl:  ?  Cholecalciferol (VITAMIN D3) 50 MCG (2000 UT) capsule, Take 2,000 Units by mouth daily., Disp: , Rfl:  ?  Coenzyme Q10 (COQ10) 200 MG CAPS, Take 200 mg by mouth in the morning and at bedtime., Disp: , Rfl:  ?  diphenhydramine-acetaminophen (TYLENOL PM) 25-500 MG TABS tablet, Take 1 tablet by mouth at bedtime as needed (sleep/pain)., Disp: , Rfl:  ?  furosemide (LASIX) 40 MG tablet, Take 0.5 tablets (20 mg total) by mouth daily., Disp: 90 tablet, Rfl: 3 ?  GARLIC 14944PO, Take 19,675mg by mouth in the morning and at bedtime., Disp: , Rfl:  ?  LECITHIN PO, Take 1 tablet by mouth daily. , Disp: , Rfl:  ?  Lutein 40 MG CAPS, Take 40 mg by mouth daily., Disp: , Rfl:  ?  magnesium oxide (MAG-OX) 400 MG tablet,  Take 400 mg by mouth daily., Disp: , Rfl:  ?  metoprolol tartrate (LOPRESSOR) 25 MG tablet, TAKE 1 TABLET BY MOUTH TWICE A DAY (Patient taking differently: Take 25 mg by mouth 2 (two) times daily.), Disp: 180 tablet, Rfl: 0 ?  Multiple Vitamin (MULTIVITAMIN) capsule, Take 1 capsule by mouth daily., Disp: , Rfl:  ?  nitroGLYCERIN (NITROSTAT) 0.4 MG SL tablet, Place 0.4 mg under the tongue every 5 (five) minutes as needed for chest pain., Disp: , Rfl:  ?  pantoprazole (PROTONIX) 40 MG tablet, TAKE 1 TABLET BY MOUTH EVERY DAY, Disp: 90 tablet, Rfl: 0 ?  rosuvastatin (CRESTOR) 10 MG tablet, TAKE 1 TABLET BY MOUTH EVERY DAY, Disp: 90 tablet, Rfl: 1 ?  Turmeric (QC TUMERIC COMPLEX PO), Take 1,000 tablets by mouth daily at 6 (six) AM., Disp: , Rfl:  ? ?Cardiovascular studies: ? ?Echocardiogram 07/15/2021:  ?Normal LV systolic function with visual EF 60-65%. Left ventricle cavity  ?is normal in size. Normal left ventricular wall thickness. Normal global  ?wall motion. Normal diastolic filling pattern, normal LAP. Calculated EF  ?63%.  ?Mild tricuspid regurgitation. Mild pulmonary hypertension. RVSP measures  ?37 mmHg.  ?Compared to 06/03/2020 Indeterminate diastolic filling is now normal,  ?otherwise no significant change.  ? ?EKG 03/17/2020: ?Sinus rhythm  75 bpm ?Left atrial enlargement ?Left anterior fascicular block ?Possible old posterior infarct ? ?Coronary angiography and intervention 10/24/2018: ?LM: Normal ?LAD: Minimal luminal irregularities Diag1 ?LCx: 100% prox LCx occlusion. Mid 80% stenosis. ?RCA: Minimal luminal irregularities RPL ?  ?Successful percutaneous coronary intervention Prox-mid LCx ?PTCA and overlapping stents ?Synergy DES 3.0 X 28 mm & Synergy DES 2.5 X 8 mm  ?  ?Echocardiogram 10/25/2018: ?LVEF 60-65%. Mild hypokinesis of the left ventricular, mid-apical inferolateral wall. Grade 1 DD ?Mild LA dilatation. ?Moderate MAC. ?Trace AI ?RVSP 27 mmHg ? ?Recent labs: ?07/15/2021: ?Glucose 90, BUN/Cr  34/1.27. EGFR 41. Na/K 141/4.5. Rest of the CMP normal ?H/H 14/42. MCV 92. Platelets 288 ?HbA1C NA ?Chol 18-, TG 185, HDL 65, LDL 84 ? ?03/23/2020: ?Glucose 115, BUN/Cr 28/1.0. EGFR 55. Na/K 140/3.7.  ?H/H 9.3/28.2. MCV 95.9. Platelets 243 ? ?09/25/2019: ?Glucose 123, BUN/Cr 41/1.31. EGFR 37. Na/K 135/3.6.  ?H/H 10/34. MCV 95. Platelets 207 ? ?05/2019: ?Glucose 103, BUN/Cr 28/1.28. EGFR 43. Na/K 141/4.4. Rest of the CMP normal ?Chol 161, TG 169, HDL 59, LDL 74 ? ?Lipid panel 08/20/2018: ?Chol 229,TG121, HDL 75, LDL 129.  ? ?10/25/2018:  ?H/H 13.7/41. MCV 94. Platelets 306 ? ?09/2013: ?TSH 2.552 normal ? ? ?Review of Systems  ?Cardiovascular:  Negative for chest pain, dyspnea on exertion, leg swelling, palpitations and syncope.  ? ?   ? ?Vitals:  ? 07/25/21 1016  ?BP: (!) 157/74  ?Pulse: 67  ?Resp: 16  ?Temp: 98 ?F (36.7 ?C)  ?SpO2: 99%  ? ? ? ?Body mass index is 22.86 kg/m?. ?Filed Weights  ? 07/25/21 1016  ?Weight: 121 lb (54.9 kg)  ? ? ? ?Objective:  ? Physical Exam ?Vitals and nursing note reviewed.  ?Constitutional:   ?   General: She is not in acute distress. ?Neck:  ?   Vascular: No JVD.  ?Cardiovascular:  ?   Rate and Rhythm: Normal rate and regular rhythm.  ?   Pulses: Intact distal pulses.  ?   Heart sounds: Normal heart sounds. No murmur heard. ?Pulmonary:  ?   Effort: Pulmonary effort is normal.  ?   Breath sounds: Normal breath sounds. No wheezing or rales.  ? ? ?  ICD-10-CM   ?1. Coronary artery disease involving native coronary artery of native heart without angina pectoris  I25.10   ?  ?2. Essential hypertension  I10   ?  ?3. Mixed hyperlipidemia  E78.2   ?  ? ? ? ?   ?Assessment & Recommendations:  ? ? ?88 y.o. Caucasian female  with hypertension, hyperlipidemia, CKD3, arthritis, CAD, NSTEMI 09/2018 ? ?CAD: ?NSTEMI 09/2018 fromSubacute LCx occlusion treated with complex PCI (overlapping stents Synergy DES 3.0 X 28 mm & Synergy DES 2.5 X 8 mm). ?No recurrent anginal symptoms.  ?Continue Aspirin 81 mg,  metoprolol 25 mg twice daily. ?Continue crestor 10 mg daily.  Lipid panel is not perfect, it seems reasonable to continue current Crestor dose given her age and other muscle musculoskeletal complaints at this time.  Patient is also decided to make some changes to her diet. ? ?Hypertension: ?Generally well controlled.  No change made today.  Continue follow-up with Dr. Jacelyn Grip. ? ?PH: ?Mild, clinically asymptomatic. ?PASP 37 mmHg. ?Repeat echocardiogram only as needed ? ?F/u in 1 year ? ?Nigel Mormon, MD ?[facility] ?Pager: [phone] ?Office: [phone] ?   ?

## 2021-07-26 DIAGNOSIS — M810 Age-related osteoporosis without current pathological fracture: Secondary | ICD-10-CM | POA: Diagnosis not present

## 2021-07-26 DIAGNOSIS — I10 Essential (primary) hypertension: Secondary | ICD-10-CM | POA: Diagnosis not present

## 2021-07-26 DIAGNOSIS — N1832 Chronic kidney disease, stage 3b: Secondary | ICD-10-CM | POA: Diagnosis not present

## 2021-07-26 DIAGNOSIS — E782 Mixed hyperlipidemia: Secondary | ICD-10-CM | POA: Diagnosis not present

## 2021-08-01 DIAGNOSIS — Z780 Asymptomatic menopausal state: Secondary | ICD-10-CM | POA: Diagnosis not present

## 2021-08-01 DIAGNOSIS — M8589 Other specified disorders of bone density and structure, multiple sites: Secondary | ICD-10-CM | POA: Diagnosis not present

## 2021-08-14 ENCOUNTER — Encounter: Payer: Self-pay | Admitting: Cardiology

## 2021-08-15 NOTE — Telephone Encounter (Signed)
Pt requesting echo results

## 2021-09-30 ENCOUNTER — Other Ambulatory Visit: Payer: Self-pay | Admitting: Cardiology

## 2021-12-28 DIAGNOSIS — I10 Essential (primary) hypertension: Secondary | ICD-10-CM | POA: Diagnosis not present

## 2021-12-28 DIAGNOSIS — Z6823 Body mass index (BMI) 23.0-23.9, adult: Secondary | ICD-10-CM | POA: Diagnosis not present

## 2021-12-28 DIAGNOSIS — E782 Mixed hyperlipidemia: Secondary | ICD-10-CM | POA: Diagnosis not present

## 2022-01-22 ENCOUNTER — Encounter: Payer: Self-pay | Admitting: Cardiology

## 2022-01-25 ENCOUNTER — Other Ambulatory Visit: Payer: Self-pay | Admitting: Cardiology

## 2022-01-25 DIAGNOSIS — I2510 Atherosclerotic heart disease of native coronary artery without angina pectoris: Secondary | ICD-10-CM

## 2022-01-25 MED ORDER — ASPIRIN 81 MG PO TBEC: 81.0000 mg | DELAYED_RELEASE_TABLET | Freq: Every day | ORAL | 3 refills | Status: AC

## 2022-03-03 ENCOUNTER — Other Ambulatory Visit: Payer: Self-pay

## 2022-03-03 MED ORDER — NITROGLYCERIN 0.4 MG SL SUBL: 0.4000 mg | SUBLINGUAL_TABLET | SUBLINGUAL | 3 refills | Status: AC | PRN

## 2022-03-13 DIAGNOSIS — E782 Mixed hyperlipidemia: Secondary | ICD-10-CM | POA: Diagnosis not present

## 2022-03-13 DIAGNOSIS — N1832 Chronic kidney disease, stage 3b: Secondary | ICD-10-CM | POA: Diagnosis not present

## 2022-03-13 DIAGNOSIS — N183 Chronic kidney disease, stage 3 unspecified: Secondary | ICD-10-CM | POA: Diagnosis not present

## 2022-03-13 DIAGNOSIS — I10 Essential (primary) hypertension: Secondary | ICD-10-CM | POA: Diagnosis not present

## 2022-03-13 DIAGNOSIS — M810 Age-related osteoporosis without current pathological fracture: Secondary | ICD-10-CM | POA: Diagnosis not present

## 2022-04-17 DIAGNOSIS — Z1389 Encounter for screening for other disorder: Secondary | ICD-10-CM | POA: Diagnosis not present

## 2022-04-17 DIAGNOSIS — Z0000 Encounter for general adult medical examination without abnormal findings: Secondary | ICD-10-CM | POA: Diagnosis not present

## 2022-04-17 DIAGNOSIS — Z6823 Body mass index (BMI) 23.0-23.9, adult: Secondary | ICD-10-CM | POA: Diagnosis not present

## 2022-06-07 DIAGNOSIS — Z8349 Family history of other endocrine, nutritional and metabolic diseases: Secondary | ICD-10-CM | POA: Diagnosis not present

## 2022-06-07 DIAGNOSIS — I252 Old myocardial infarction: Secondary | ICD-10-CM | POA: Diagnosis not present

## 2022-06-07 DIAGNOSIS — Z818 Family history of other mental and behavioral disorders: Secondary | ICD-10-CM | POA: Diagnosis not present

## 2022-06-07 DIAGNOSIS — R69 Illness, unspecified: Secondary | ICD-10-CM | POA: Diagnosis not present

## 2022-06-07 DIAGNOSIS — K5900 Constipation, unspecified: Secondary | ICD-10-CM | POA: Diagnosis not present

## 2022-06-07 DIAGNOSIS — E785 Hyperlipidemia, unspecified: Secondary | ICD-10-CM | POA: Diagnosis not present

## 2022-06-07 DIAGNOSIS — M1990 Unspecified osteoarthritis, unspecified site: Secondary | ICD-10-CM | POA: Diagnosis not present

## 2022-06-07 DIAGNOSIS — I129 Hypertensive chronic kidney disease with stage 1 through stage 4 chronic kidney disease, or unspecified chronic kidney disease: Secondary | ICD-10-CM | POA: Diagnosis not present

## 2022-06-07 DIAGNOSIS — N189 Chronic kidney disease, unspecified: Secondary | ICD-10-CM | POA: Diagnosis not present

## 2022-06-07 DIAGNOSIS — I25119 Atherosclerotic heart disease of native coronary artery with unspecified angina pectoris: Secondary | ICD-10-CM | POA: Diagnosis not present

## 2022-06-07 DIAGNOSIS — Z96649 Presence of unspecified artificial hip joint: Secondary | ICD-10-CM | POA: Diagnosis not present

## 2022-06-14 IMAGING — CT CT HEAD W/O CM
4 series · 16 of 47 positions shown, 18 images · non-contrast
Comparison: CT scan of the head dated 11/07/2010 and cervical
radiographs dated 10/22/2007

CLINICAL DATA: Acute headache after a fall. The patient takes blood
thinners.

EXAM:
CT HEAD WITHOUT CONTRAST
CT MAXILLOFACIAL WITHOUT CONTRAST
CT CERVICAL SPINE WITHOUT CONTRAST
TECHNIQUE: Multidetector CT imaging of the head, cervical spine, and
maxillofacial structures were performed using the standard protocol
without intravenous contrast. Multiplanar CT image reconstructions
of the cervical spine and maxillofacial structures were also
generated.

[Series 3: head wo · axial · 0.42mm/px · z∈[+1116,+1242]mm · 7 of 35 slices shown, 9 images]
[im 5/35  brain]
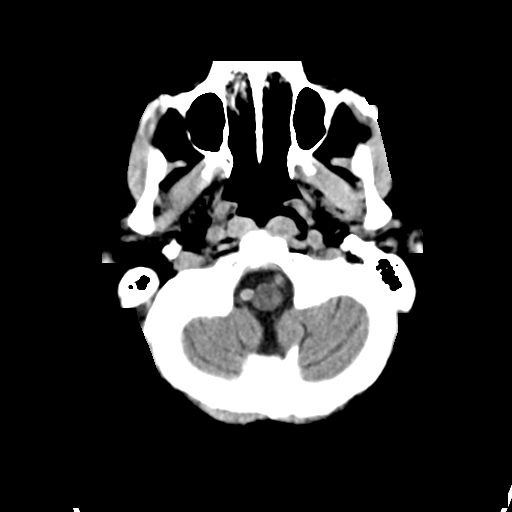
[im 5/35  bone]
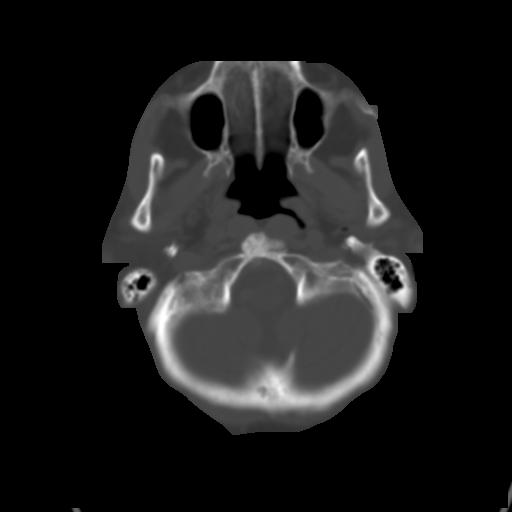
[im 9/35  brain]
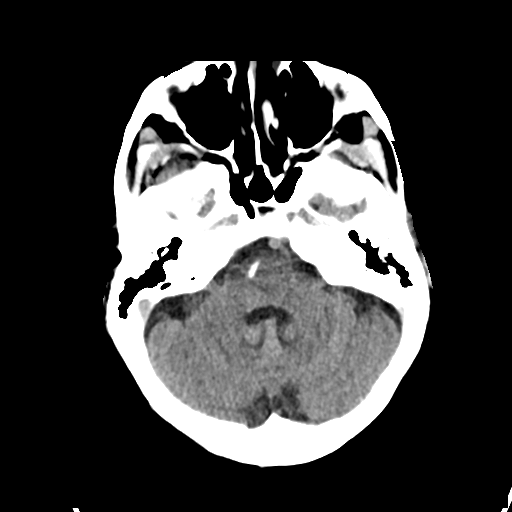
[im 13/35  brain]
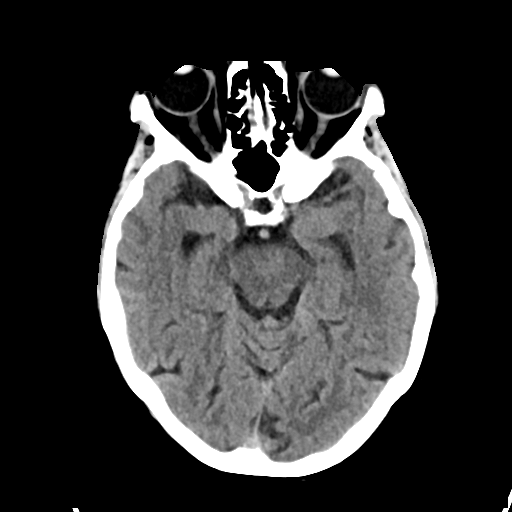
[im 18/35  brain]
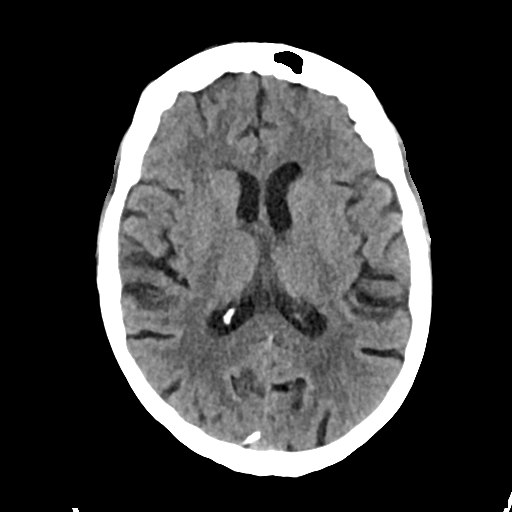
[im 22/35  brain]
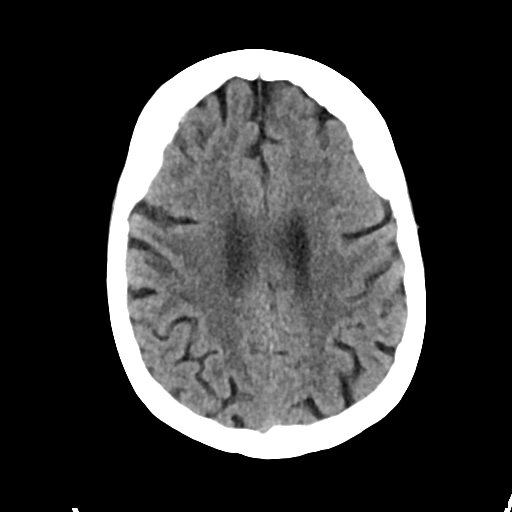
[im 22/35  bone]
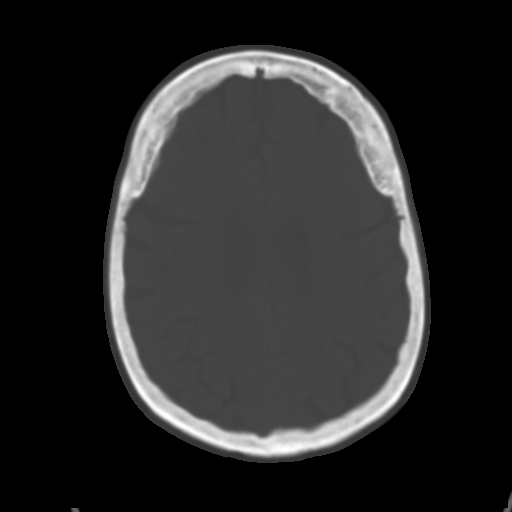
[im 26/35  brain]
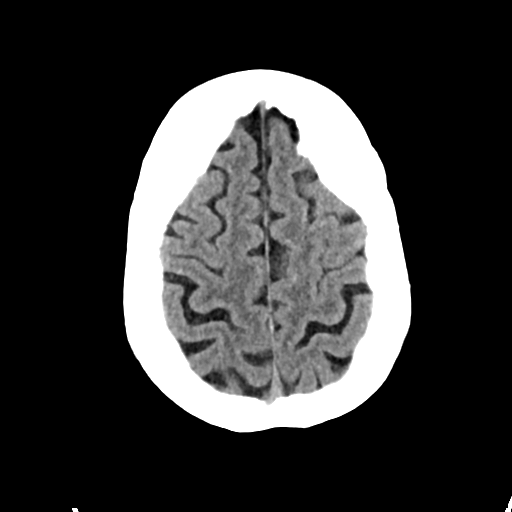
[im 30/35  brain]
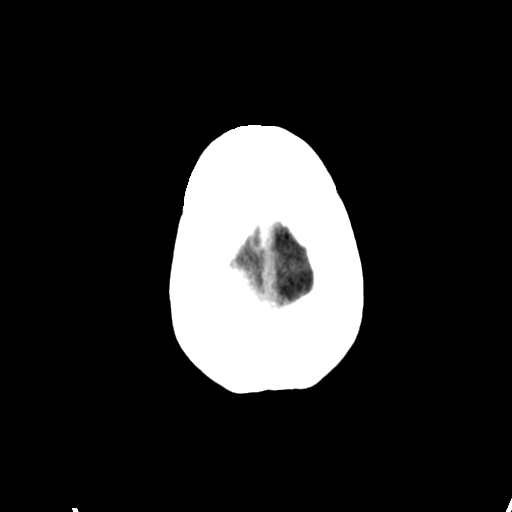

[Series 4: head bone · axial · 0.42mm/px · z∈[+1112,+1146]mm · 3 of 87 slices shown]
[im 9/87  bone]
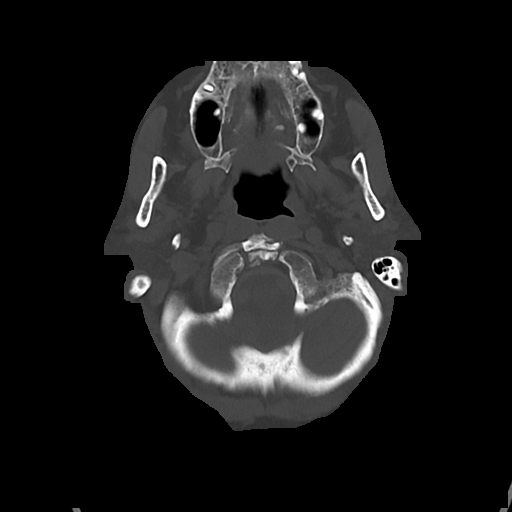
[im 18/87  bone]
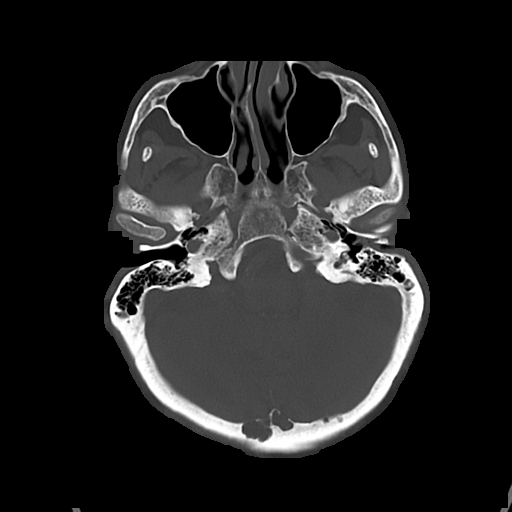
[im 26/87  bone]
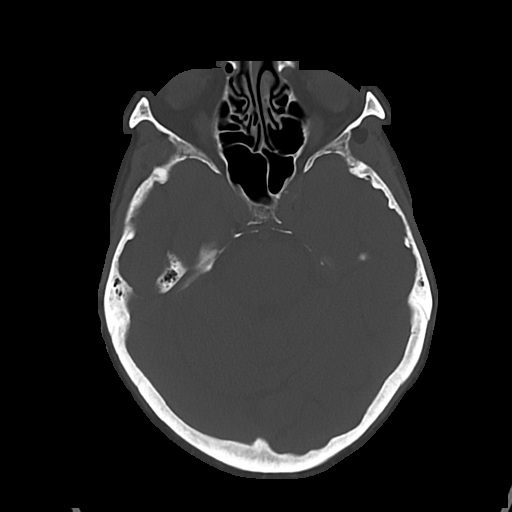

[Series 5: cor soft · coronal · 0.33mm/px · 3 of 69 slices shown]
[im 23/69  brain]
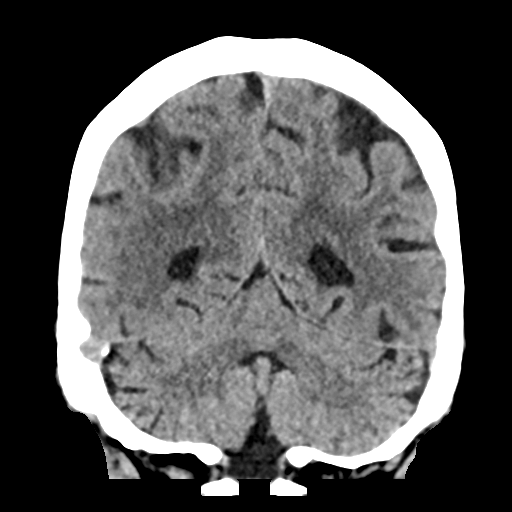
[im 31/69  brain]
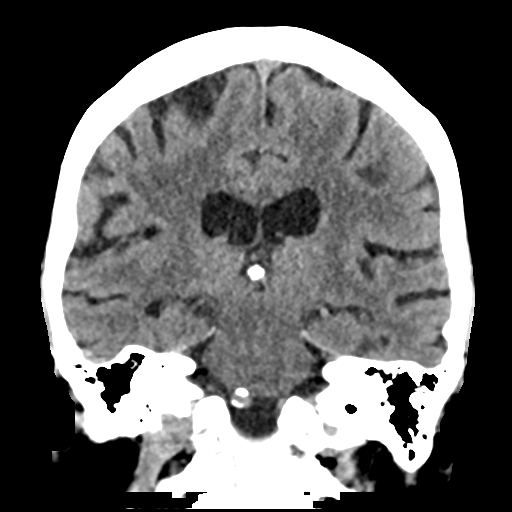
[im 38/69  brain]
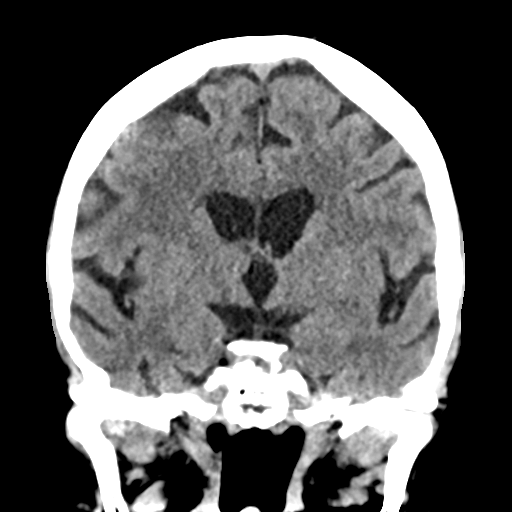

[Series 6: sag soft · sagittal · 0.33mm/px · 3 of 56 slices shown]
[im 19/56  brain]
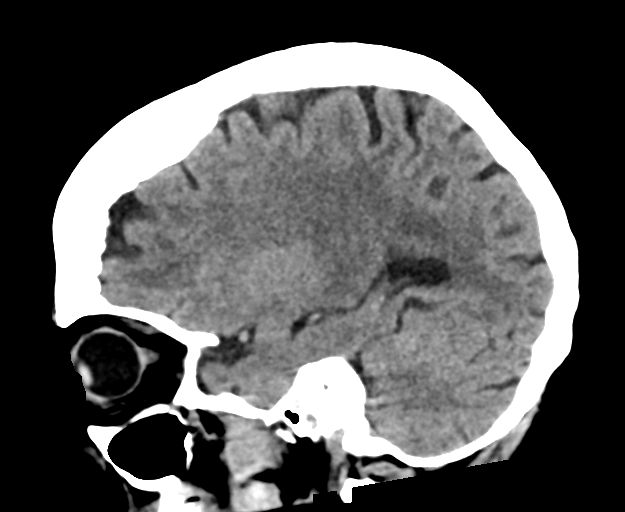
[im 28/56  brain]
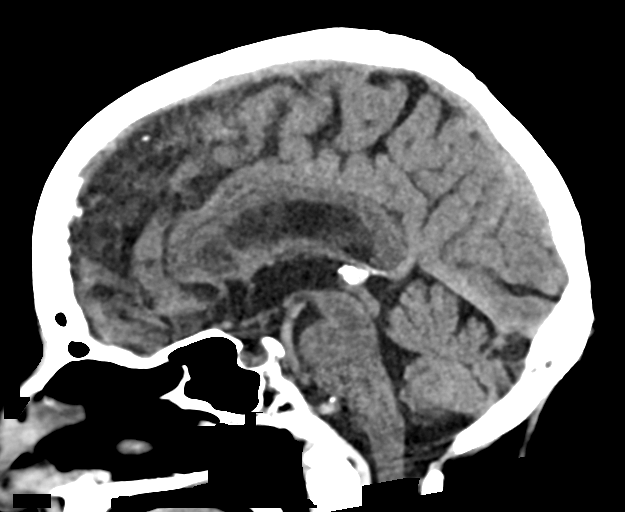
[im 37/56  brain]
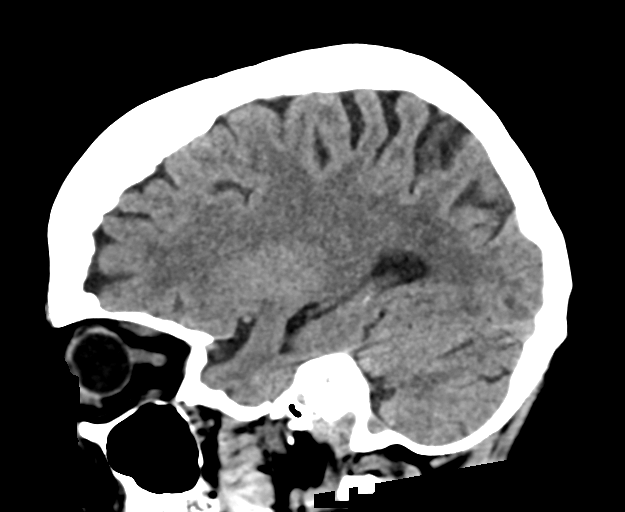

[16 of 47 positions shown; findings below may reference images not displayed]

FINDINGS: CT HEAD FINDINGS

Brain: No evidence of acute infarction, hemorrhage, hydrocephalus,
extra-axial collection or mass lesion/mass effect.

Vascular: No hyperdense vessel or unexpected calcification.

Skull: Normal. Negative for fracture or focal lesion.

Other: Normal.

CT MAXILLOFACIAL FINDINGS

Osseous: No fracture or mandibular dislocation. No destructive
process.

Orbits: Negative. No traumatic or inflammatory finding.

Sinuses: Clear.

Soft tissues: Negative.

CT CERVICAL SPINE FINDINGS

Alignment: 1 mm spondylolisthesis at C4-5, C7-T1, and T1-2 and T2-3.
Otherwise normal.

Skull base and vertebrae: No acute fracture. No primary bone lesion
or focal pathologic process.

Soft tissues and spinal canal: No prevertebral fluid or swelling. No
visible canal hematoma.

Disc levels: C2-3: Tiny central disc bulge with no neural
impingement. Moderate left facet arthritis. No foraminal stenosis.

C3-4: Tiny disc osteophyte complex asymmetric to the right without
neural impingement. Moderate right foraminal narrowing. Moderate
right and mild left facet arthritis.

C4-5: Small disc osteophyte complex asymmetric to the left. No
foraminal stenosis. Severe left facet arthritis. Disc space
narrowing.

C5-6: Disc space narrowing. Tiny bilateral uncinate osteophytes.
Slight narrowing of the right neural foramen. No facet arthritis.

C6-7: Disc space narrowing. No disc bulging or protrusion. No facet
arthritis.

C7-T1: No disc bulging or protrusion. No foraminal stenosis. No
facet arthritis.

Upper chest: Negative.

Other: None
IMPRESSION: 1. Normal CT scan of the head.
2. No acute abnormality of the maxillofacial structures.
3. No acute abnormality of the cervical spine. Multilevel
degenerative disc and joint disease.

## 2022-07-18 DIAGNOSIS — E782 Mixed hyperlipidemia: Secondary | ICD-10-CM | POA: Diagnosis not present

## 2022-07-18 DIAGNOSIS — N1832 Chronic kidney disease, stage 3b: Secondary | ICD-10-CM | POA: Diagnosis not present

## 2022-07-18 DIAGNOSIS — I10 Essential (primary) hypertension: Secondary | ICD-10-CM | POA: Diagnosis not present

## 2022-07-18 DIAGNOSIS — I25119 Atherosclerotic heart disease of native coronary artery with unspecified angina pectoris: Secondary | ICD-10-CM | POA: Diagnosis not present

## 2022-07-18 DIAGNOSIS — D692 Other nonthrombocytopenic purpura: Secondary | ICD-10-CM | POA: Diagnosis not present

## 2022-07-21 ENCOUNTER — Ambulatory Visit: Payer: Medicare HMO | Admitting: Cardiology

## 2022-07-21 ENCOUNTER — Encounter: Payer: Self-pay | Admitting: Cardiology

## 2022-07-21 VITALS — BP 156/77 | HR 65 | Ht 61.0 in | Wt 118.0 lb

## 2022-07-21 DIAGNOSIS — I2510 Atherosclerotic heart disease of native coronary artery without angina pectoris: Secondary | ICD-10-CM | POA: Diagnosis not present

## 2022-07-21 DIAGNOSIS — E782 Mixed hyperlipidemia: Secondary | ICD-10-CM | POA: Diagnosis not present

## 2022-07-21 DIAGNOSIS — I10 Essential (primary) hypertension: Secondary | ICD-10-CM | POA: Diagnosis not present

## 2022-07-21 NOTE — Progress Notes (Signed)
Follow up visit  Subjective:   Tracy Rogers, female    DOB: 02-07-1931, 89 y.o.   MRN: [id number]   Chief Complaint  Patient presents with   Coronary Artery Disease   Follow-up    HPI  89 y.o. Caucasian female  with hypertension, hyperlipidemia, CKD3, arthritis, CAD, NSTEMI 09/2018.  Patient is doing well from cardiac standpoint. She is walking with a walker. GFR has been declining. She is referred to Nephrology.    Current Outpatient Medications:    amLODipine (NORVASC) 5 MG tablet, Take 2.5-5 mg by mouth See admin instructions. Takes 5mg  by mouth in the morning and 2.5 mg in the evening, Disp: , Rfl:    Ascorbic Acid (VITA-C PO), Take 1,000 mg by mouth daily., Disp: , Rfl:    aspirin EC ([employer] ASPIRIN LOW DOSE) 81 MG tablet, Take 1 tablet (81 mg total) by mouth daily. SWALLOW WHOLE., Disp: 90 tablet, Rfl: 3   Bilberry 1000 MG CAPS, Take 1,000 mg by mouth in the morning and at bedtime. , Disp: , Rfl:    Cholecalciferol (VITAMIN D3) 50 MCG (2000 UT) capsule, Take 2,000 Units by mouth daily., Disp: , Rfl:    Coenzyme Q10 (COQ10) 200 MG CAPS, Take 200 mg by mouth in the morning and at bedtime., Disp: , Rfl:    furosemide (LASIX) 40 MG tablet, Take 0.5 tablets (20 mg total) by mouth daily., Disp: 90 tablet, Rfl: 3   GARLIC 1500 PO, Take 1,500 mg by mouth in the morning and at bedtime., Disp: , Rfl:    Lutein 40 MG CAPS, Take 40 mg by mouth daily., Disp: , Rfl:    magnesium oxide (MAG-OX) 400 MG tablet, Take 400 mg by mouth daily. 3 days a week, Disp: , Rfl:    metoprolol tartrate (LOPRESSOR) 25 MG tablet, TAKE 1 TABLET BY MOUTH TWICE A DAY (Patient taking differently: Take 25 mg by mouth 2 (two) times daily.), Disp: 180 tablet, Rfl: 0   Multiple Vitamin (MULTIVITAMIN) capsule, Take 1 capsule by mouth daily., Disp: , Rfl:    nitroGLYCERIN (NITROSTAT) 0.4 MG SL tablet, Place 1 tablet (0.4 mg total) under the tongue every 5 (five) minutes as needed for chest pain., Disp: 25  tablet, Rfl: 3   rosuvastatin (CRESTOR) 10 MG tablet, TAKE 1 TABLET BY MOUTH EVERY DAY, Disp: 90 tablet, Rfl: 1   TART CHERRY PO, Take by mouth daily at 6 (six) AM., Disp: , Rfl:    Turmeric (QC TUMERIC COMPLEX PO), Take 1,000 tablets by mouth daily at 6 (six) AM., Disp: , Rfl:   Cardiovascular studies:  EKG 07/22/2022: Sinus rhythm 64 bpm LAFB LVH Old anteroseptal infarct  Echocardiogram 07/15/2021:  Normal LV systolic function with visual EF 60-65%. Left ventricle cavity  is normal in size. Normal left ventricular wall thickness. Normal global  wall motion. Normal diastolic filling pattern, normal LAP. Calculated EF  63%.  Mild tricuspid regurgitation. Mild pulmonary hypertension. RVSP measures  37 mmHg.  Compared to 06/03/2020 Indeterminate diastolic filling is now normal,  otherwise no significant change.    Coronary angiography and intervention 10/24/2018: LM: Normal LAD: Minimal luminal irregularities Diag1 LCx: 100% prox LCx occlusion. Mid 80% stenosis. RCA: Minimal luminal irregularities RPL   Successful percutaneous coronary intervention Prox-mid LCx PTCA and overlapping stents Synergy DES 3.0 X 28 mm & Synergy DES 2.5 X 8 mm    Echocardiogram 10/25/2018: LVEF 60-65%. Mild hypokinesis of the left ventricular, mid-apical inferolateral wall. Grade 1 DD Mild  LA dilatation. Moderate MAC. Trace AI RVSP 27 mmHg  Recent labs: 07/18/2022: Glucose 95, BUN/Cr 44/1.6. EGFR 30. K 4.9. Hb 14.2 HbA1C NA Chol 172, TG 244, HDL 65, LDL 68  07/15/2021: Glucose 90, BUN/Cr 34/1.27. EGFR 41. Na/K 141/4.5. Rest of the CMP normal H/H 14/42. MCV 92. Platelets 288 HbA1C NA Chol 18-, TG 185, HDL 65, LDL 84  03/23/2020: Glucose 115, BUN/Cr 28/1.0. EGFR 55. Na/K 140/3.7.  H/H 9.3/28.2. MCV 95.9. Platelets 243  09/25/2019: Glucose 123, BUN/Cr 41/1.31. EGFR 37. Na/K 135/3.6.  H/H 10/34. MCV 95. Platelets 207  05/2019: Glucose 103, BUN/Cr 28/1.28. EGFR 43. Na/K 141/4.4. Rest of the  CMP normal Chol 161, TG 169, HDL 59, LDL 74  Lipid panel 08/20/2018: Chol 229,TG121, HDL 75, LDL 129.   10/25/2018:  H/H 13.7/41. MCV 94. Platelets 306  09/2013: TSH 2.552 normal   Review of Systems  Cardiovascular:  Negative for chest pain, dyspnea on exertion, leg swelling, palpitations and syncope.        Vitals:   07/21/22 1137 07/21/22 1144  BP: (!) 166/70 (!) 156/77  Pulse: 65 65  SpO2: 98% 97%     Body mass index is 22.3 kg/m. Filed Weights   07/21/22 1137  Weight: 118 lb (53.5 kg)     Objective:   Physical Exam Vitals and nursing note reviewed.  Constitutional:      General: She is not in acute distress. Neck:     Vascular: No JVD.  Cardiovascular:     Rate and Rhythm: Normal rate and regular rhythm.     Pulses: Intact distal pulses.     Heart sounds: Normal heart sounds. No murmur heard. Pulmonary:     Effort: Pulmonary effort is normal.     Breath sounds: Normal breath sounds. No wheezing or rales.  Musculoskeletal:     Right lower leg: Edema (Trace) present.     Left lower leg: Edema (Trace) present.       ICD-10-CM   1. Coronary artery disease involving native coronary artery of native heart without angina pectoris  I25.10 EKG 12-Lead          Assessment & Recommendations:    89 y.o. Caucasian female  with hypertension, hyperlipidemia, CKD3, arthritis, CAD, NSTEMI 09/2018  CAD: NSTEMI 09/2018 fromSubacute LCx occlusion treated with complex PCI (overlapping stents Synergy DES 3.0 X 28 mm & Synergy DES 2.5 X 8 mm). No recurrent anginal symptoms.  Continue Aspirin 81 mg, metoprolol 25 mg twice daily. Continue crestor 10 mg daily.  Patient will be was not fasting.  Repeat fasting lipid panel with PCP in future.  Given her advanced age, she would like to avoid increasing Crestor dose, which is reasonable.    Hypertension: Generally well controlled, likely component of whitecoat hypertension.  No changes made today. Continue follow-up with  nephrology.  PH: Mild, clinically asymptomatic. PASP 37 mmHg. Repeat echocardiogram only as needed  F/u in 1 year   Elder Negus, MD Pager: [phone] Office: [phone]

## 2022-07-26 ENCOUNTER — Ambulatory Visit: Payer: Medicare HMO | Admitting: Cardiology

## 2022-07-28 ENCOUNTER — Ambulatory Visit: Payer: Medicare HMO | Admitting: Family Medicine

## 2022-09-19 DIAGNOSIS — H5203 Hypermetropia, bilateral: Secondary | ICD-10-CM | POA: Diagnosis not present

## 2022-09-19 DIAGNOSIS — H52223 Regular astigmatism, bilateral: Secondary | ICD-10-CM | POA: Diagnosis not present

## 2022-09-19 DIAGNOSIS — H524 Presbyopia: Secondary | ICD-10-CM | POA: Diagnosis not present

## 2022-11-01 DIAGNOSIS — I129 Hypertensive chronic kidney disease with stage 1 through stage 4 chronic kidney disease, or unspecified chronic kidney disease: Secondary | ICD-10-CM | POA: Diagnosis not present

## 2022-11-01 DIAGNOSIS — I214 Non-ST elevation (NSTEMI) myocardial infarction: Secondary | ICD-10-CM | POA: Diagnosis not present

## 2022-11-01 DIAGNOSIS — N1832 Chronic kidney disease, stage 3b: Secondary | ICD-10-CM | POA: Diagnosis not present

## 2022-11-01 DIAGNOSIS — E782 Mixed hyperlipidemia: Secondary | ICD-10-CM | POA: Diagnosis not present

## 2022-11-20 DIAGNOSIS — M545 Low back pain, unspecified: Secondary | ICD-10-CM | POA: Diagnosis not present

## 2022-12-06 ENCOUNTER — Encounter: Payer: Self-pay | Admitting: Skilled Nursing Facility1

## 2022-12-06 ENCOUNTER — Encounter: Payer: Medicare HMO | Attending: Nephrology | Admitting: Skilled Nursing Facility1

## 2022-12-06 DIAGNOSIS — Z713 Dietary counseling and surveillance: Secondary | ICD-10-CM | POA: Diagnosis not present

## 2022-12-06 DIAGNOSIS — N1832 Chronic kidney disease, stage 3b: Secondary | ICD-10-CM | POA: Insufficient documentation

## 2022-12-06 DIAGNOSIS — N189 Chronic kidney disease, unspecified: Secondary | ICD-10-CM

## 2022-12-06 NOTE — Progress Notes (Unsigned)
Medical Nutrition Therapy  Appointment Start time:  9:36  Appointment End time:  10:36  Primary concerns today: to check in and make sure she is doing right with her diet Referral diagnosis: n18.32   NUTRITION ASSESSMENT    Clinical Medical Hx: CKD Medications: see list Labs:  Notable Signs/Symptoms: none reported   Lifestyle & Dietary Hx  Pt states she is careful with sodium and eating in general.  Pt brought a very detailed report of her medical history with her.  Pt state she lives in a condo  Pt states she loves to walk.   Estimated daily fluid intake: 60+ oz Supplements: several  Sleep:  Stress / self-care: low stress level  Current average weekly physical activity: walking with her walker in her neighborhood  24-Hr Dietary Recall First Meal: organic cheerios sometimes with cabbed peaches or pineapple (in its own juice) or sometimes cottage cheese Snack:  Second Meal:  Snack:  Third Meal: cottage cheese with peaches Snack: ice cream Beverages: beer (g[place] import only) or wine (from [place] only) sometimes, 3 bottles water, hot tea, coffee, diluted juice   NUTRITION INTERVENTION  Nutrition education (E-1) on the following topics:  Educated pt on the importance of consistently eating throughout the day: Sustained Energy Levels: Regular meals and snacks help maintain stable blood sugar levels, providing a steady source of energy throughout the day. This can prevent energy crashes and help you stay focused and alert.  Metabolism Regulation: Eating regularly can help keep your metabolism active and functioning efficiently. Consistent meals can prevent your body from going into "starvation mode," where it conserves energy and slows down metabolism.  Nutrient Absorption: Spacing out meals allows your body to better absorb and utilize nutrients from food. Eating throughout the day ensures that your body receives a continuous supply of essential vitamins, minerals, and other  nutrients.  Appetite Control: Regular meals and snacks can help regulate appetite and prevent undereating.   Blood Sugar Regulation: Eating at regular intervals helps prevent spikes and crashes in blood sugar levels. This is particularly important for individuals with diabetes or those at risk of developing diabetes.  Improved Mood and Concentration: Balanced, regular meals can positively impact your mood and cognitive function. Stable blood sugar levels support brain function, leading to better concentration, memory, and overall mental well-being.  Muscle Maintenance: Consuming protein-rich snacks or meals throughout the day can help support muscle repair and maintenance, especially for individuals who are physically active or engaged in strength training.  Overall, eating throughout the day supports your body's needs for energy, nutrients, and overall health, promoting a balanced and sustainable approach to nutrition   Learning Style & Readiness for Change Teaching method utilized: Visual & Auditory  Demonstrated degree of understanding via: Teach Back  Barriers to learning/adherence to lifestyle change: none  Goals Established by Pt Continue your current eating and activity habits   MONITORING & EVALUATION Dietary intake, weekly physical activity  Next Steps  Patient is to call or email with any future questions or concerns.

## 2023-02-26 DIAGNOSIS — M545 Low back pain, unspecified: Secondary | ICD-10-CM | POA: Diagnosis not present

## 2023-03-02 DIAGNOSIS — M545 Low back pain, unspecified: Secondary | ICD-10-CM | POA: Diagnosis not present

## 2023-04-19 DIAGNOSIS — I10 Essential (primary) hypertension: Secondary | ICD-10-CM | POA: Diagnosis not present

## 2023-04-19 DIAGNOSIS — Z0000 Encounter for general adult medical examination without abnormal findings: Secondary | ICD-10-CM | POA: Diagnosis not present

## 2023-04-19 DIAGNOSIS — H9193 Unspecified hearing loss, bilateral: Secondary | ICD-10-CM | POA: Diagnosis not present

## 2023-04-19 DIAGNOSIS — Z955 Presence of coronary angioplasty implant and graft: Secondary | ICD-10-CM | POA: Diagnosis not present

## 2023-04-19 DIAGNOSIS — N1832 Chronic kidney disease, stage 3b: Secondary | ICD-10-CM | POA: Diagnosis not present

## 2023-04-19 DIAGNOSIS — E782 Mixed hyperlipidemia: Secondary | ICD-10-CM | POA: Diagnosis not present

## 2023-04-19 DIAGNOSIS — M810 Age-related osteoporosis without current pathological fracture: Secondary | ICD-10-CM | POA: Diagnosis not present

## 2023-04-20 DIAGNOSIS — I10 Essential (primary) hypertension: Secondary | ICD-10-CM | POA: Diagnosis not present

## 2023-04-24 DIAGNOSIS — Z008 Encounter for other general examination: Secondary | ICD-10-CM | POA: Diagnosis not present

## 2023-05-02 DIAGNOSIS — E782 Mixed hyperlipidemia: Secondary | ICD-10-CM | POA: Diagnosis not present

## 2023-05-02 DIAGNOSIS — N1832 Chronic kidney disease, stage 3b: Secondary | ICD-10-CM | POA: Diagnosis not present

## 2023-05-02 DIAGNOSIS — I214 Non-ST elevation (NSTEMI) myocardial infarction: Secondary | ICD-10-CM | POA: Diagnosis not present

## 2023-05-02 DIAGNOSIS — M4807 Spinal stenosis, lumbosacral region: Secondary | ICD-10-CM | POA: Diagnosis not present

## 2023-05-02 DIAGNOSIS — I129 Hypertensive chronic kidney disease with stage 1 through stage 4 chronic kidney disease, or unspecified chronic kidney disease: Secondary | ICD-10-CM | POA: Diagnosis not present

## 2023-05-10 ENCOUNTER — Telehealth: Payer: Self-pay

## 2023-05-10 NOTE — Telephone Encounter (Signed)
Patient called to report that she has developed an allergic reaction to latex gloves. She has an appointment 05/18/23. She wanted to make sure the gloves we use are not latex

## 2023-05-18 ENCOUNTER — Ambulatory Visit: Payer: Medicare HMO | Admitting: Podiatry

## 2023-05-18 ENCOUNTER — Encounter: Payer: Self-pay | Admitting: Podiatry

## 2023-05-18 VITALS — Ht 60.0 in | Wt 118.0 lb

## 2023-05-18 DIAGNOSIS — M79674 Pain in right toe(s): Secondary | ICD-10-CM

## 2023-05-18 DIAGNOSIS — M79675 Pain in left toe(s): Secondary | ICD-10-CM

## 2023-05-18 DIAGNOSIS — B351 Tinea unguium: Secondary | ICD-10-CM | POA: Diagnosis not present

## 2023-05-18 NOTE — Progress Notes (Signed)
This patient presents to the office with chief complaint of long thick  big painful nails.  Patient says the nails are painful walking and wearing shoes.  This patient is unable to self treat.  This patient is unable to trim her nails since she is unable to reach her nails.  She presents to the office for preventative foot care services.  General Appearance  Alert, conversant and in no acute stress.  Vascular  Dorsalis pedis and posterior tibial  pulses are  weakly palpable  bilaterally.  Capillary return is within normal limits  bilaterally. Temperature is within normal limits  bilaterally.Swelling lower legs  B/L.  Neurologic  Senn-Weinstein monofilament wire test within normal limits  right.  Diminished LOPS left. Muscle power within normal limits bilaterally.  Nails Thick disfigured discolored nails with subungual debris  from hallux to fifth toes bilaterally. No evidence of bacterial infection or drainage bilaterally.  Orthopedic  No limitations of motion  feet .  No crepitus or effusions noted.  No bony pathology or digital deformities noted.  Skin  normotropic skin with no porokeratosis noted bilaterally.  No signs of infections or ulcers noted.     Onychomycosis  Nails  B/L.  Pain in right toes  Pain in left toes  IE.  Debridement of nails both feet followed trimming the nails with dremel tool.    RTC 3 months.   Helane Gunther DPM

## 2023-05-21 ENCOUNTER — Emergency Department (HOSPITAL_BASED_OUTPATIENT_CLINIC_OR_DEPARTMENT_OTHER): Payer: Medicare HMO

## 2023-05-21 ENCOUNTER — Other Ambulatory Visit: Payer: Self-pay

## 2023-05-21 ENCOUNTER — Encounter (HOSPITAL_BASED_OUTPATIENT_CLINIC_OR_DEPARTMENT_OTHER): Payer: Self-pay

## 2023-05-21 ENCOUNTER — Emergency Department (HOSPITAL_BASED_OUTPATIENT_CLINIC_OR_DEPARTMENT_OTHER)
Admission: EM | Admit: 2023-05-21 | Discharge: 2023-05-21 | Disposition: A | Discharge status: Home / Self Care | Payer: Medicare HMO | Attending: Emergency Medicine | Admitting: Emergency Medicine

## 2023-05-21 ENCOUNTER — Telehealth: Payer: Self-pay | Admitting: *Deleted

## 2023-05-21 DIAGNOSIS — W19XXXA Unspecified fall, initial encounter: Secondary | ICD-10-CM | POA: Diagnosis not present

## 2023-05-21 DIAGNOSIS — Y92019 Unspecified place in single-family (private) house as the place of occurrence of the external cause: Secondary | ICD-10-CM | POA: Insufficient documentation

## 2023-05-21 DIAGNOSIS — Z96641 Presence of right artificial hip joint: Secondary | ICD-10-CM | POA: Insufficient documentation

## 2023-05-21 DIAGNOSIS — M1711 Unilateral primary osteoarthritis, right knee: Secondary | ICD-10-CM | POA: Diagnosis not present

## 2023-05-21 DIAGNOSIS — Z7982 Long term (current) use of aspirin: Secondary | ICD-10-CM | POA: Insufficient documentation

## 2023-05-21 DIAGNOSIS — I2510 Atherosclerotic heart disease of native coronary artery without angina pectoris: Secondary | ICD-10-CM | POA: Insufficient documentation

## 2023-05-21 DIAGNOSIS — Z91040 Latex allergy status: Secondary | ICD-10-CM | POA: Diagnosis not present

## 2023-05-21 DIAGNOSIS — S80919A Unspecified superficial injury of unspecified knee, initial encounter: Secondary | ICD-10-CM | POA: Diagnosis not present

## 2023-05-21 DIAGNOSIS — Z743 Need for continuous supervision: Secondary | ICD-10-CM | POA: Diagnosis not present

## 2023-05-21 DIAGNOSIS — Z043 Encounter for examination and observation following other accident: Secondary | ICD-10-CM | POA: Diagnosis not present

## 2023-05-21 DIAGNOSIS — W08XXXA Fall from other furniture, initial encounter: Secondary | ICD-10-CM | POA: Insufficient documentation

## 2023-05-21 DIAGNOSIS — S82141A Displaced bicondylar fracture of right tibia, initial encounter for closed fracture: Secondary | ICD-10-CM | POA: Diagnosis not present

## 2023-05-21 DIAGNOSIS — M25461 Effusion, right knee: Secondary | ICD-10-CM | POA: Diagnosis not present

## 2023-05-21 DIAGNOSIS — Z0389 Encounter for observation for other suspected diseases and conditions ruled out: Secondary | ICD-10-CM | POA: Diagnosis not present

## 2023-05-21 DIAGNOSIS — I10 Essential (primary) hypertension: Secondary | ICD-10-CM | POA: Insufficient documentation

## 2023-05-21 DIAGNOSIS — S8991XA Unspecified injury of right lower leg, initial encounter: Secondary | ICD-10-CM | POA: Diagnosis not present

## 2023-05-21 MED ORDER — ACETAMINOPHEN 500 MG PO TABS
1000.0000 mg | ORAL_TABLET | Freq: Once | ORAL | Status: AC
  Administered 2023-05-21: 1000 mg via ORAL
  Filled 2023-05-21: qty 2

## 2023-05-21 NOTE — ED Notes (Signed)
 Pt.helped to car via w/c, placed on back seat w/ right knee straight, resting on rear seat.

## 2023-05-21 NOTE — ED Notes (Signed)
 Ice pack applied to right knee

## 2023-05-21 NOTE — ED Provider Notes (Signed)
 [place] EMERGENCY DEPARTMENT AT [facility] Provider Note   CSN: [id number] Arrival date & time: 05/21/23  0258     History  Chief Complaint  Patient presents with   Tracy Rogers is a 89 y.o. female.  HPI     This is a 89 year old female who presents with right knee pain.  Patient reports history of significant arthritis in that knee.  At baseline she walks with a walker and a cane.  She lives alone at home.  She states around 9 PM she got up out of her chair to go to bed and her right knee "just gave out."  She did not hit her head or lose consciousness.  She is not on any blood thinners.  She noted pain in the right knee.  She was however able to get up and use her walker but with significant pain of the right knee.  Previous right hip replacement.  She ultimately called her niece to help her who called 911.  Home Medications Prior to Admission medications   Medication Sig Start Date End Date Taking? Authorizing Provider  amLODipine (NORVASC) 5 MG tablet Take 2.5-5 mg by mouth See admin instructions. Takes 5mg  by mouth in the morning and 2.5 mg in the evening    [provider]  Ascorbic Acid (VITA-C PO) Take 1,000 mg by mouth daily.    [provider]  aspirin EC ([employer] ASPIRIN LOW DOSE) 81 MG tablet Take 1 tablet (81 mg total) by mouth daily. SWALLOW WHOLE. 01/25/22   Patwardhan, Manish J, MD  Bilberry 1000 MG CAPS Take 1,000 mg by mouth in the morning and at bedtime.     [provider]  Cholecalciferol (VITAMIN D3) 50 MCG (2000 UT) capsule Take 2,000 Units by mouth daily.    [provider]  Coenzyme Q10 (COQ10) 200 MG CAPS Take 200 mg by mouth in the morning and at bedtime.    [provider]  furosemide (LASIX) 40 MG tablet Take 0.5 tablets (20 mg total) by mouth daily. 08/16/20   Patwardhan, Manish J, MD  GARLIC 1500 PO Take 1,500 mg by mouth in the morning and at bedtime.    [provider]   Lutein 40 MG CAPS Take 40 mg by mouth daily.    [provider]  magnesium oxide (MAG-OX) 400 MG tablet Take 400 mg by mouth daily. 3 days a week    [provider]  metoprolol tartrate (LOPRESSOR) 25 MG tablet TAKE 1 TABLET BY MOUTH TWICE A DAY Patient taking differently: Take 25 mg by mouth 2 (two) times daily. 03/24/19   Patwardhan, Anabel Bene, MD  Multiple Vitamin (MULTIVITAMIN) capsule Take 1 capsule by mouth daily.    [provider]  nitroGLYCERIN (NITROSTAT) 0.4 MG SL tablet Place 1 tablet (0.4 mg total) under the tongue every 5 (five) minutes as needed for chest pain. 03/03/22   Patwardhan, Manish J, MD  rosuvastatin (CRESTOR) 10 MG tablet TAKE 1 TABLET BY MOUTH EVERY DAY 09/30/21   Patwardhan, Manish J, MD  TART CHERRY PO Take by mouth daily at 6 (six) AM.    [provider]  Turmeric (QC TUMERIC COMPLEX PO) Take 1,000 tablets by mouth daily at 6 (six) AM.    [provider]      Allergies    Latex, Penicillins, Bactrim [sulfamethoxazole-trimethoprim], Benzodiazepines, Hydrocodone, Meloxicam, Other, and Oxycodone    Review of Systems   Review of Systems  Constitutional:  Negative for fever.  Respiratory:  Negative for shortness of breath.   Cardiovascular:  Negative for chest pain.  Musculoskeletal:        Knee pain  All other systems reviewed and are negative.   Physical Exam Updated Vital Signs BP (!) 133/113   Pulse 64   Temp 97.9 F (36.6 C) (Oral)   Resp 18   Ht 1.549 m (5\' 1" )   Wt 53.5 kg   SpO2 95%   BMI 22.30 kg/m  Physical Exam Vitals and nursing note reviewed.  Constitutional:      Appearance: She is well-developed. She is not ill-appearing.  HENT:     Head: Normocephalic and atraumatic.  Eyes:     Pupils: Pupils are equal, round, and reactive to light.  Cardiovascular:     Rate and Rhythm: Normal rate and regular rhythm.     Heart sounds: Normal heart sounds.  Pulmonary:     Effort: Pulmonary effort is  normal. No respiratory distress.     Breath sounds: No wheezing.  Abdominal:     Palpations: Abdomen is soft.  Musculoskeletal:     Cervical back: Neck supple.     Comments: Swelling noted of the right knee, pain with flexion and diminished range of motion, unstable, 2+ DP pulse  Skin:    General: Skin is warm and dry.  Neurological:     Mental Status: She is alert and oriented to person, place, and time.  Psychiatric:        Mood and Affect: Mood normal.     ED Results / Procedures / Treatments   Labs (all labs ordered are listed, but only abnormal results are displayed) Labs Reviewed - No data to display  EKG None  Radiology CT Knee Right Wo Contrast Result Date: 05/21/2023 CLINICAL DATA:  Evaluate for occult fracture of the knee. EXAM: CT OF THE RIGHT KNEE WITHOUT CONTRAST TECHNIQUE: Multidetector CT imaging of the right knee was performed according to the standard protocol. Multiplanar CT image reconstructions were also generated. RADIATION DOSE REDUCTION: This exam was performed according to the departmental dose-optimization program which includes automated exposure control, adjustment of the mA and/or kV according to patient size and/or use of iterative reconstruction technique. COMPARISON:  Plain film radiographs of the right the from earlier today FINDINGS: Bones/Joint/Cartilage There is a thin band of sclerosis which extends along the anteromedial tibial plateau, image 74/6. There is moderate lateral compartment joint space narrowing with subchondral sclerosis and marginal spur formation. Small loose bodies identified within the posterior joint space. Ligaments Suboptimally assessed by CT. Muscles and Tendons No mass identified or intramuscular hematoma. Soft tissues There is subcutaneous soft tissue edema about the knee. Moderate joint effusion with Hounsfield units measuring between 37 and 53, image 50/5. IMPRESSION: 1. There is a thin band of sclerosis which extends along the  undersurface of the anteromedial tibial plateau. This is concerning for a nondisplaced, occult fracture. MRI of the left knee may be helpful to assess for associated ligamentous injury and meniscal. 2. Moderate joint effusion with Hounsfield units measuring between 37 and 53. This is concerning for hemorrhagic or proteinaceous fluid. 3. Moderate lateral compartment osteoarthritis. Electronically Signed   By: Signa Kell M.D.   On: 05/21/2023 05:26   DG Knee Complete 4 Views Right Result Date: 05/21/2023 CLINICAL DATA:  Fall EXAM: RIGHT KNEE - COMPLETE 4+ VIEW COMPARISON:  None Available. FINDINGS: There is severe lateral femorotibial compartment osteoarthrosis with complete loss of the cartilage space. Associated  subchondral sclerosis. No acute fracture or dislocation. Moderate soft tissue swelling with small suprapatellar effusion. IMPRESSION: 1. No acute fracture or dislocation. 2. Severe lateral femorotibial compartment osteoarthrosis. Electronically Signed   By: Deatra Robinson M.D.   On: 05/21/2023 03:42    Procedures Procedures    Medications Ordered in ED Medications  acetaminophen (TYLENOL) tablet 1,000 mg (1,000 mg Oral Given 05/21/23 0403)    ED Course/ Medical Decision Making/ A&P                                 Medical Decision Making Amount and/or Complexity of Data Reviewed Radiology: ordered.  Risk OTC drugs.   This patient presents to the ED for concern of knee injury, this involves an extensive number of treatment options, and is a complaint that carries with it a high risk of complications and morbidity.  I considered the following differential and admission for this acute, potentially life threatening condition.  The differential diagnosis includes fracture, ligamentous injury, dislocation  MDM:    This is a 89 year old female who presents with knee pain and swelling after a fall.  She fell approximately 5 to 6 hours prior to arrival and has been able to be  ambulatory but has had significant pain and swelling.  She has no other obvious signs of trauma.  She is not on any blood thinners.  She has significant swelling of the right knee with impaired range of motion and pain with range of motion.  X-rays show essentially bone-on-bone with no obvious fracture but is limited secondary to her osteopenia.  Given her acute swelling and pain, will obtain CT imaging.  CT is concerning for possible nondisplaced occult tibial plateau fracture.  Patient was placed in a knee immobilizer.  I had a prolonged discussion with the patient and her family at the bedside regarding my concerns for her mobility at home.  Ideally she will be nonweightbearing with a wheelchair as I do not believe that she will navigate well with crutches.  Her niece at the bedside does have a wheelchair available.  She lives alone.  I have requested that a family member stay with her for the next 24 to 48 hours while I placed home health orders and have social work follow-up with the patient.  They are agreeable to this plan.  Dr. Linna Caprice previously replaced her right hip and she would like to follow-up with him.  Encouraged her to call the office today.  She will likely need follow-up MRI imaging.  (Labs, imaging, consults)  Labs: I Ordered, and personally interpreted labs.  The pertinent results include: None  Imaging Studies ordered: I ordered imaging studies including x-ray knee, CT knee I independently visualized and interpreted imaging. I agree with the radiologist interpretation  Additional history obtained from family at bedside.  External records from outside source obtained and reviewed including prior evaluations  Cardiac Monitoring: The patient was maintained on a cardiac monitor.  If on the cardiac monitor, I personally viewed and interpreted the cardiac monitored which showed an underlying rhythm of: Sinus  Reevaluation: After the interventions noted above, I reevaluated the  patient and found that they have :stayed the same  Social Determinants of Health:  elderly, lives alone  Disposition: Discharge with orthopedic follow-up and home health referral  Co morbidities that complicate the patient evaluation  Past Medical History:  Diagnosis Date   AKI (acute kidney injury) (HCC) 09/24/2019  Arthritis    Atherosclerosis of native coronary artery of native heart without angina pectoris 02/14/2019   Humerus fracture 08/2019   Hyperlipidemia    Hypertension    Lumbar herniated disc    Osteopenia    Osteoporosis    Sciatica    Spinal stenosis      Medicines Meds ordered this encounter  Medications   acetaminophen (TYLENOL) tablet 1,000 mg    I have reviewed the patients home medicines and have made adjustments as needed  Problem List / ED Course: Problem List Items Addressed This Visit   None Visit Diagnoses       Closed fracture of right tibial plateau, initial encounter    -  Primary                   Final Clinical Impression(s) / ED Diagnoses Final diagnoses:  Closed fracture of right tibial plateau, initial encounter    Rx / DC Orders ED Discharge Orders          Ordered    Home Health        05/21/23 [phone]    Face-to-face encounter (required for Medicare/Medicaid patients)       Comments: I Mayer Masker Liat Mayol certify that this patient is under my care and that I, or a nurse practitioner or physician's assistant working with me, had a face-to-face encounter that meets the physician face-to-face encounter requirements with this patient on 05/21/2023. The encounter with the patient was in whole, or in part for the following medical condition(s) which is the primary reason for home health care (List medical condition): tibial plateau fracture   05/21/23 0552    For home use only DME standard manual wheelchair with seat cushion       Comments: Patient suffers from knee injury/tibial plateau fracture which impairs their ability to  perform daily activities like dressing, feeding, and grooming in the home.  A cane, crutch, or walker will not resolve issue with performing activities of daily living. A wheelchair will allow patient to safely perform daily activities. Patient can safely propel the wheelchair in the home or has a caregiver who can provide assistance. Length of need 6 months . Accessories: elevating leg rests (ELRs), wheel locks, extensions and anti-tippers.   05/21/23 6213              Shon Baton, MD 05/21/23 [phone]

## 2023-05-21 NOTE — Telephone Encounter (Signed)
 Rj Pedrosa J. Lucretia Roers, RN, BSN, [place] [phone] RNCM spoke with pt niece Tacey Ruiz) via telephone regarding discharge planning for [employer]. Offered pt medicare.gov list of home health agencies to choose from.  Leah chose [facility] to render PT, MSW, and HHA services. Elnita Maxwell  of [facility] notified and accepted referral. Tacey Ruiz made aware that [facility] will be in contact in 24-48 hours.  No DME needs identified at this time.

## 2023-05-21 NOTE — ED Triage Notes (Addendum)
 Arrives [facility] from home after standing up from couch and her knee gave out landing on right knee. She says fall was ~930pm and difficult but she was able to stand up after. Presents with home knee brace on.   Denies anticoagulants. Denies head injury or LOC.   [facility]-15.

## 2023-05-21 NOTE — Discharge Instructions (Signed)
 You were seen today after a fall.  Your CT suggest that you may have a tibial plateau fracture.  It is best that you primarily use a wheelchair.  Keep the knee immobilizer in place at all times.  Do not try to mobilize without assistance.  If you need to you may touchdown weight-bear.  It is important that you have assistance at home during this initial time.  I have placed orders for home health evaluation and we will follow-up with our social workers in the emergency department.  Keep the leg iced and elevated.  Take Tylenol as needed for pain.

## 2023-05-22 ENCOUNTER — Telehealth: Payer: Self-pay | Admitting: *Deleted

## 2023-05-22 NOTE — Telephone Encounter (Signed)
 Pt niece called regarding not having heard from [facility] agency.  RNCM contacted [facility] agency ([facility]) and  rep states she will call them promptly.

## 2023-05-23 DIAGNOSIS — R531 Weakness: Secondary | ICD-10-CM | POA: Diagnosis not present

## 2023-05-24 DIAGNOSIS — M1711 Unilateral primary osteoarthritis, right knee: Secondary | ICD-10-CM | POA: Diagnosis not present

## 2023-05-24 DIAGNOSIS — S82141A Displaced bicondylar fracture of right tibia, initial encounter for closed fracture: Secondary | ICD-10-CM | POA: Diagnosis not present

## 2023-06-04 DIAGNOSIS — R262 Difficulty in walking, not elsewhere classified: Secondary | ICD-10-CM | POA: Diagnosis not present

## 2023-06-04 DIAGNOSIS — R278 Other lack of coordination: Secondary | ICD-10-CM | POA: Diagnosis not present

## 2023-06-04 DIAGNOSIS — S82141A Displaced bicondylar fracture of right tibia, initial encounter for closed fracture: Secondary | ICD-10-CM | POA: Diagnosis not present

## 2023-06-04 DIAGNOSIS — E785 Hyperlipidemia, unspecified: Secondary | ICD-10-CM | POA: Diagnosis not present

## 2023-06-04 DIAGNOSIS — S82141D Displaced bicondylar fracture of right tibia, subsequent encounter for closed fracture with routine healing: Secondary | ICD-10-CM | POA: Diagnosis not present

## 2023-06-04 DIAGNOSIS — M1711 Unilateral primary osteoarthritis, right knee: Secondary | ICD-10-CM | POA: Diagnosis not present

## 2023-06-04 DIAGNOSIS — M25511 Pain in right shoulder: Secondary | ICD-10-CM | POA: Diagnosis not present

## 2023-06-04 DIAGNOSIS — Q782 Osteopetrosis: Secondary | ICD-10-CM | POA: Diagnosis not present

## 2023-06-04 DIAGNOSIS — M810 Age-related osteoporosis without current pathological fracture: Secondary | ICD-10-CM | POA: Diagnosis not present

## 2023-06-04 DIAGNOSIS — I10 Essential (primary) hypertension: Secondary | ICD-10-CM | POA: Diagnosis not present

## 2023-06-04 DIAGNOSIS — R531 Weakness: Secondary | ICD-10-CM | POA: Diagnosis not present

## 2023-06-04 DIAGNOSIS — M6281 Muscle weakness (generalized): Secondary | ICD-10-CM | POA: Diagnosis not present

## 2023-06-07 DIAGNOSIS — M1711 Unilateral primary osteoarthritis, right knee: Secondary | ICD-10-CM | POA: Diagnosis not present

## 2023-06-07 DIAGNOSIS — I10 Essential (primary) hypertension: Secondary | ICD-10-CM | POA: Diagnosis not present

## 2023-06-07 DIAGNOSIS — S82141A Displaced bicondylar fracture of right tibia, initial encounter for closed fracture: Secondary | ICD-10-CM | POA: Diagnosis not present

## 2023-06-07 DIAGNOSIS — S82141D Displaced bicondylar fracture of right tibia, subsequent encounter for closed fracture with routine healing: Secondary | ICD-10-CM | POA: Diagnosis not present

## 2023-06-07 DIAGNOSIS — E785 Hyperlipidemia, unspecified: Secondary | ICD-10-CM | POA: Diagnosis not present

## 2023-06-21 DIAGNOSIS — M25511 Pain in right shoulder: Secondary | ICD-10-CM | POA: Diagnosis not present

## 2023-06-21 DIAGNOSIS — S82141A Displaced bicondylar fracture of right tibia, initial encounter for closed fracture: Secondary | ICD-10-CM | POA: Diagnosis not present

## 2023-06-21 DIAGNOSIS — M1711 Unilateral primary osteoarthritis, right knee: Secondary | ICD-10-CM | POA: Diagnosis not present

## 2023-07-05 DIAGNOSIS — M25511 Pain in right shoulder: Secondary | ICD-10-CM | POA: Diagnosis not present

## 2023-07-05 DIAGNOSIS — M1711 Unilateral primary osteoarthritis, right knee: Secondary | ICD-10-CM | POA: Diagnosis not present

## 2023-07-05 DIAGNOSIS — S82141A Displaced bicondylar fracture of right tibia, initial encounter for closed fracture: Secondary | ICD-10-CM | POA: Diagnosis not present

## 2023-07-20 ENCOUNTER — Ambulatory Visit: Payer: Self-pay | Admitting: Cardiology

## 2023-07-24 DIAGNOSIS — S82141A Displaced bicondylar fracture of right tibia, initial encounter for closed fracture: Secondary | ICD-10-CM | POA: Diagnosis not present

## 2023-07-24 DIAGNOSIS — M1711 Unilateral primary osteoarthritis, right knee: Secondary | ICD-10-CM | POA: Diagnosis not present

## 2023-07-26 DIAGNOSIS — E785 Hyperlipidemia, unspecified: Secondary | ICD-10-CM | POA: Diagnosis not present

## 2023-07-26 DIAGNOSIS — I10 Essential (primary) hypertension: Secondary | ICD-10-CM | POA: Diagnosis not present

## 2023-07-26 DIAGNOSIS — M810 Age-related osteoporosis without current pathological fracture: Secondary | ICD-10-CM | POA: Diagnosis not present

## 2023-07-26 DIAGNOSIS — M1711 Unilateral primary osteoarthritis, right knee: Secondary | ICD-10-CM | POA: Diagnosis not present

## 2023-07-27 DIAGNOSIS — R278 Other lack of coordination: Secondary | ICD-10-CM | POA: Diagnosis not present

## 2023-07-27 DIAGNOSIS — E785 Hyperlipidemia, unspecified: Secondary | ICD-10-CM | POA: Diagnosis not present

## 2023-07-27 DIAGNOSIS — Q782 Osteopetrosis: Secondary | ICD-10-CM | POA: Diagnosis not present

## 2023-07-27 DIAGNOSIS — M6281 Muscle weakness (generalized): Secondary | ICD-10-CM | POA: Diagnosis not present

## 2023-07-27 DIAGNOSIS — S82141D Displaced bicondylar fracture of right tibia, subsequent encounter for closed fracture with routine healing: Secondary | ICD-10-CM | POA: Diagnosis not present

## 2023-07-27 DIAGNOSIS — R262 Difficulty in walking, not elsewhere classified: Secondary | ICD-10-CM | POA: Diagnosis not present

## 2023-07-30 DIAGNOSIS — M6281 Muscle weakness (generalized): Secondary | ICD-10-CM | POA: Diagnosis not present

## 2023-07-30 DIAGNOSIS — E785 Hyperlipidemia, unspecified: Secondary | ICD-10-CM | POA: Diagnosis not present

## 2023-07-30 DIAGNOSIS — R278 Other lack of coordination: Secondary | ICD-10-CM | POA: Diagnosis not present

## 2023-07-30 DIAGNOSIS — Q782 Osteopetrosis: Secondary | ICD-10-CM | POA: Diagnosis not present

## 2023-07-30 DIAGNOSIS — R262 Difficulty in walking, not elsewhere classified: Secondary | ICD-10-CM | POA: Diagnosis not present

## 2023-07-30 DIAGNOSIS — S82141D Displaced bicondylar fracture of right tibia, subsequent encounter for closed fracture with routine healing: Secondary | ICD-10-CM | POA: Diagnosis not present

## 2023-07-31 DIAGNOSIS — R278 Other lack of coordination: Secondary | ICD-10-CM | POA: Diagnosis not present

## 2023-07-31 DIAGNOSIS — R262 Difficulty in walking, not elsewhere classified: Secondary | ICD-10-CM | POA: Diagnosis not present

## 2023-07-31 DIAGNOSIS — Q782 Osteopetrosis: Secondary | ICD-10-CM | POA: Diagnosis not present

## 2023-07-31 DIAGNOSIS — M6281 Muscle weakness (generalized): Secondary | ICD-10-CM | POA: Diagnosis not present

## 2023-07-31 DIAGNOSIS — S82141D Displaced bicondylar fracture of right tibia, subsequent encounter for closed fracture with routine healing: Secondary | ICD-10-CM | POA: Diagnosis not present

## 2023-07-31 DIAGNOSIS — E785 Hyperlipidemia, unspecified: Secondary | ICD-10-CM | POA: Diagnosis not present

## 2023-08-01 DIAGNOSIS — E785 Hyperlipidemia, unspecified: Secondary | ICD-10-CM | POA: Diagnosis not present

## 2023-08-01 DIAGNOSIS — M6281 Muscle weakness (generalized): Secondary | ICD-10-CM | POA: Diagnosis not present

## 2023-08-01 DIAGNOSIS — R278 Other lack of coordination: Secondary | ICD-10-CM | POA: Diagnosis not present

## 2023-08-01 DIAGNOSIS — R262 Difficulty in walking, not elsewhere classified: Secondary | ICD-10-CM | POA: Diagnosis not present

## 2023-08-01 DIAGNOSIS — Q782 Osteopetrosis: Secondary | ICD-10-CM | POA: Diagnosis not present

## 2023-08-01 DIAGNOSIS — S82141D Displaced bicondylar fracture of right tibia, subsequent encounter for closed fracture with routine healing: Secondary | ICD-10-CM | POA: Diagnosis not present

## 2023-08-02 DIAGNOSIS — M6281 Muscle weakness (generalized): Secondary | ICD-10-CM | POA: Diagnosis not present

## 2023-08-02 DIAGNOSIS — R262 Difficulty in walking, not elsewhere classified: Secondary | ICD-10-CM | POA: Diagnosis not present

## 2023-08-02 DIAGNOSIS — S82141D Displaced bicondylar fracture of right tibia, subsequent encounter for closed fracture with routine healing: Secondary | ICD-10-CM | POA: Diagnosis not present

## 2023-08-02 DIAGNOSIS — R278 Other lack of coordination: Secondary | ICD-10-CM | POA: Diagnosis not present

## 2023-08-02 DIAGNOSIS — E785 Hyperlipidemia, unspecified: Secondary | ICD-10-CM | POA: Diagnosis not present

## 2023-08-02 DIAGNOSIS — Q782 Osteopetrosis: Secondary | ICD-10-CM | POA: Diagnosis not present

## 2023-08-03 DIAGNOSIS — Q782 Osteopetrosis: Secondary | ICD-10-CM | POA: Diagnosis not present

## 2023-08-03 DIAGNOSIS — E785 Hyperlipidemia, unspecified: Secondary | ICD-10-CM | POA: Diagnosis not present

## 2023-08-03 DIAGNOSIS — M6281 Muscle weakness (generalized): Secondary | ICD-10-CM | POA: Diagnosis not present

## 2023-08-03 DIAGNOSIS — R278 Other lack of coordination: Secondary | ICD-10-CM | POA: Diagnosis not present

## 2023-08-03 DIAGNOSIS — S82141D Displaced bicondylar fracture of right tibia, subsequent encounter for closed fracture with routine healing: Secondary | ICD-10-CM | POA: Diagnosis not present

## 2023-08-03 DIAGNOSIS — R262 Difficulty in walking, not elsewhere classified: Secondary | ICD-10-CM | POA: Diagnosis not present

## 2023-08-06 DIAGNOSIS — Q782 Osteopetrosis: Secondary | ICD-10-CM | POA: Diagnosis not present

## 2023-08-06 DIAGNOSIS — R262 Difficulty in walking, not elsewhere classified: Secondary | ICD-10-CM | POA: Diagnosis not present

## 2023-08-06 DIAGNOSIS — M6281 Muscle weakness (generalized): Secondary | ICD-10-CM | POA: Diagnosis not present

## 2023-08-06 DIAGNOSIS — R278 Other lack of coordination: Secondary | ICD-10-CM | POA: Diagnosis not present

## 2023-08-06 DIAGNOSIS — E785 Hyperlipidemia, unspecified: Secondary | ICD-10-CM | POA: Diagnosis not present

## 2023-08-06 DIAGNOSIS — S82141D Displaced bicondylar fracture of right tibia, subsequent encounter for closed fracture with routine healing: Secondary | ICD-10-CM | POA: Diagnosis not present

## 2023-08-14 ENCOUNTER — Ambulatory Visit: Attending: Cardiology | Admitting: Cardiology

## 2023-08-14 ENCOUNTER — Encounter: Payer: Self-pay | Admitting: Cardiology

## 2023-08-14 VITALS — BP 140/80 | HR 74 | Resp 16 | Ht 61.0 in | Wt 117.0 lb

## 2023-08-14 DIAGNOSIS — R600 Localized edema: Secondary | ICD-10-CM

## 2023-08-14 DIAGNOSIS — I2510 Atherosclerotic heart disease of native coronary artery without angina pectoris: Secondary | ICD-10-CM | POA: Diagnosis not present

## 2023-08-14 DIAGNOSIS — I10 Essential (primary) hypertension: Secondary | ICD-10-CM | POA: Diagnosis not present

## 2023-08-14 MED ORDER — FUROSEMIDE 40 MG PO TABS: 40.0000 mg | ORAL_TABLET | Freq: Every day | ORAL | 3 refills | Status: DC

## 2023-08-14 NOTE — Patient Instructions (Signed)
 Medication Instructions:  START Lasix  40 mg take one tablet daily   *If you need a refill on your cardiac medications before your next appointment, please call your pharmacy*  Lab Work: Bmp Pro bnp  If you have labs (blood work) drawn today and your tests are completely normal, you will receive your results only by: MyChart Message (if you have MyChart) OR A paper copy in the mail If you have any lab test that is abnormal or we need to change your treatment, we will call you to review the results.  Testing/Procedures: Echo  Your physician has requested that you have an echocardiogram. Echocardiography is a painless test that uses sound waves to create images of your heart. It provides your doctor with information about the size and shape of your heart and how well your heart's chambers and valves are working. This procedure takes approximately one hour. There are no restrictions for this procedure. Please do NOT wear cologne, perfume, aftershave, or lotions (deodorant is allowed). Please arrive 15 minutes prior to your appointment time.  Please note: We ask at that you not bring children with you during ultrasound (echo/ vascular) testing. Due to room size and safety concerns, children are not allowed in the ultrasound rooms during exams. Our front office staff cannot provide observation of children in our lobby area while testing is being conducted. An adult accompanying a patient to their appointment will only be allowed in the ultrasound room at the discretion of the ultrasound technician under special circumstances. We apologize for any inconvenience.   Right lower extremity venous duplex  Your physician has requested that you have a lower or upper extremity venous duplex. This test is an ultrasound of the veins in the legs or arms. It looks at venous blood flow that carries blood from the heart to the legs or arms. Allow one hour for a Lower Venous exam. Allow thirty minutes for an  Upper Venous exam. There are no restrictions or special instructions.  Please note: We ask at that you not bring children with you during ultrasound (echo/ vascular) testing. Due to room size and safety concerns, children are not allowed in the ultrasound rooms during exams. Our front office staff cannot provide observation of children in our lobby area while testing is being conducted. An adult accompanying a patient to their appointment will only be allowed in the ultrasound room at the discretion of the ultrasound technician under special circumstances. We apologize for any inconvenience.   Follow-Up: At [facility], you and your health needs are our priority.  As part of our continuing mission to provide you with exceptional heart care, our providers are all part of one team.  This team includes your primary Cardiologist (physician) and Advanced Practice Providers or APPs (Physician Assistants and Nurse Practitioners) who all work together to provide you with the care you need, when you need it.  Your next appointment:   4-6 week(s)  Provider:   Cody Das, MD

## 2023-08-14 NOTE — Progress Notes (Signed)
 Cardiology Office Note:  .   Date:  08/14/2023  ID:  Enos Harts, DOB 06-02-1930, MRN [id number] PCP: Suzzette Eth, MD (Inactive)  [facility] Providers Cardiologist:  Fransico Ivy, MD PCP: Suzzette Eth, MD (Inactive)  Chief Complaint  Patient presents with   Coronary artery disease involving native coronary artery of   Follow-up     [facility] is a 89 y.o. female with hypertension, hyperlipidemia, CKD3, arthritis, CAD,   History of Present Illness  Patient is here today with her niece.  Patient is currently living in an assisted living facility.  Few weeks ago, she had a fall that led to tibia-fibula fracture.  She was given a brace to wear, did not undergo surgery.  She has regular orthopedic follow-up.  Her physical activity has been limited since then.  She has noticed bilateral leg swelling, right more than left.  She denies any chest pain, shortness of breath symptoms.  Blood pressure slightly elevated     Vitals:   08/14/23 1548  BP: (!) 140/80  Pulse: 74  Resp: 16  SpO2: 96%      Review of Systems  Cardiovascular:  Positive for leg swelling. Negative for chest pain, dyspnea on exertion, palpitations and syncope.        Studies Reviewed: Aaron Aas         Independently interpreted 03/2023: Chol 168, TG 119, HDL 72, LDL 75 Hb 14.5 Cr 1.5  EKG 08/14/2023: Normal sinus rhythm Left anterior fascicular block Moderate voltage criteria for LVH, may be normal variant ( R in aVL , Cornell product ) When compared with ECG of 24-Sep-2019 16:28, No significant change was found     Physical Exam Vitals and nursing note reviewed.  Constitutional:      General: She is not in acute distress. Neck:     Vascular: No JVD.  Cardiovascular:     Rate and Rhythm: Normal rate and regular rhythm.     Heart sounds: Normal heart sounds. No murmur heard. Pulmonary:     Effort: Pulmonary effort is normal.     Breath sounds: Normal breath  sounds. No wheezing or rales.  Musculoskeletal:     Right lower leg: Edema (2+) present.     Left lower leg: Edema (1+) present.      VISIT DIAGNOSES:   ICD-10-CM   1. Coronary artery disease involving native coronary artery of native heart without angina pectoris  I25.10 EKG 12-Lead    Basic metabolic panel with GFR    Pro b natriuretic peptide (BNP)    2. Leg edema  R60.0 ECHOCARDIOGRAM COMPLETE    VAS US  LOWER EXTREMITY VENOUS (DVT)    3. Essential hypertension  I10        [facility] is a 89 y.o. female with hypertension, hyperlipidemia, CKD3, arthritis, CAD Assessment & Plan   CAD: NSTEMI 09/2018 fromSubacute LCx occlusion treated with complex PCI (overlapping stents Synergy DES 3.0 X 28 mm & Synergy DES 2.5 X 8 mm). No recurrent anginal symptoms.  Continue Aspirin  81 mg, metoprolol  25 mg twice daily. Continue crestor  10 mg daily.  Lipids well-controlled.    Leg edema: Mild PAH noted on echocardiogram in 2023.  Recent tibia/fibula fracture and immobility raises concern for DVT. Start Lasix  40 mg daily.  Check BMP, proBNP in 1 week. Check a right lower extremity DVT ultrasound.   Hypertension: Mildly elevated.  Addition of Lasix  should help.      Meds ordered this encounter  Medications  furosemide  (LASIX ) 40 MG tablet    Sig: Take 1 tablet (40 mg total) by mouth daily.    Dispense:  90 tablet    Refill:  3     F/u in 4-6 weeks  Signed, Cody Das, MD

## 2023-08-15 ENCOUNTER — Ambulatory Visit: Payer: Medicare HMO | Admitting: Podiatry

## 2023-08-17 DIAGNOSIS — H903 Sensorineural hearing loss, bilateral: Secondary | ICD-10-CM | POA: Diagnosis not present

## 2023-08-17 DIAGNOSIS — J3489 Other specified disorders of nose and nasal sinuses: Secondary | ICD-10-CM | POA: Diagnosis not present

## 2023-08-20 DIAGNOSIS — R531 Weakness: Secondary | ICD-10-CM | POA: Diagnosis not present

## 2023-08-21 ENCOUNTER — Ambulatory Visit: Payer: Self-pay | Admitting: Cardiology

## 2023-08-21 ENCOUNTER — Ambulatory Visit (HOSPITAL_COMMUNITY)
Admission: RE | Admit: 2023-08-21 | Discharge: 2023-08-21 | Disposition: A | Discharge status: Home / Self Care | Source: Ambulatory Visit | Attending: Cardiology | Admitting: Cardiology

## 2023-08-21 DIAGNOSIS — E785 Hyperlipidemia, unspecified: Secondary | ICD-10-CM | POA: Diagnosis not present

## 2023-08-21 DIAGNOSIS — R600 Localized edema: Secondary | ICD-10-CM | POA: Diagnosis not present

## 2023-08-21 DIAGNOSIS — I10 Essential (primary) hypertension: Secondary | ICD-10-CM | POA: Diagnosis not present

## 2023-08-21 DIAGNOSIS — S82141S Displaced bicondylar fracture of right tibia, sequela: Secondary | ICD-10-CM | POA: Diagnosis not present

## 2023-08-21 DIAGNOSIS — M810 Age-related osteoporosis without current pathological fracture: Secondary | ICD-10-CM | POA: Diagnosis not present

## 2023-08-22 DIAGNOSIS — M1711 Unilateral primary osteoarthritis, right knee: Secondary | ICD-10-CM | POA: Diagnosis not present

## 2023-08-22 DIAGNOSIS — S82141A Displaced bicondylar fracture of right tibia, initial encounter for closed fracture: Secondary | ICD-10-CM | POA: Diagnosis not present

## 2023-08-22 LAB — BASIC METABOLIC PANEL WITH GFR
BUN/Creatinine Ratio: 29 — ABNORMAL HIGH (ref 12–28)
BUN: 34 mg/dL (ref 10–36)
CO2: 21 mmol/L (ref 20–29)
Calcium: 9.7 mg/dL (ref 8.7–10.3)
Chloride: 99 mmol/L (ref 96–106)
Creatinine, Ser: 1.17 mg/dL — ABNORMAL HIGH (ref 0.57–1.00)
Glucose: 76 mg/dL (ref 70–99)
Potassium: 3.8 mmol/L (ref 3.5–5.2)
Sodium: 142 mmol/L (ref 134–144)
eGFR: 44 mL/min/{1.73_m2} — ABNORMAL LOW (ref >59)

## 2023-08-22 LAB — PRO B NATRIURETIC PEPTIDE: NT-Pro BNP: 399 pg/mL (ref 0–738)

## 2023-09-01 ENCOUNTER — Encounter: Payer: Self-pay | Admitting: Cardiology

## 2023-09-03 MED ORDER — FUROSEMIDE 40 MG PO TABS: 40.0000 mg | ORAL_TABLET | Freq: Every day | ORAL | 3 refills | Status: DC

## 2023-09-07 DIAGNOSIS — Z7982 Long term (current) use of aspirin: Secondary | ICD-10-CM | POA: Diagnosis not present

## 2023-09-07 DIAGNOSIS — M80061D Age-related osteoporosis with current pathological fracture, right lower leg, subsequent encounter for fracture with routine healing: Secondary | ICD-10-CM | POA: Diagnosis not present

## 2023-09-07 DIAGNOSIS — I272 Pulmonary hypertension, unspecified: Secondary | ICD-10-CM | POA: Diagnosis not present

## 2023-09-07 DIAGNOSIS — N289 Disorder of kidney and ureter, unspecified: Secondary | ICD-10-CM | POA: Diagnosis not present

## 2023-09-07 DIAGNOSIS — I252 Old myocardial infarction: Secondary | ICD-10-CM | POA: Diagnosis not present

## 2023-09-07 DIAGNOSIS — M48061 Spinal stenosis, lumbar region without neurogenic claudication: Secondary | ICD-10-CM | POA: Diagnosis not present

## 2023-09-07 DIAGNOSIS — I2510 Atherosclerotic heart disease of native coronary artery without angina pectoris: Secondary | ICD-10-CM | POA: Diagnosis not present

## 2023-09-07 DIAGNOSIS — G8929 Other chronic pain: Secondary | ICD-10-CM | POA: Diagnosis not present

## 2023-09-07 DIAGNOSIS — M25511 Pain in right shoulder: Secondary | ICD-10-CM | POA: Diagnosis not present

## 2023-09-07 DIAGNOSIS — Z602 Problems related to living alone: Secondary | ICD-10-CM | POA: Diagnosis not present

## 2023-09-07 DIAGNOSIS — E782 Mixed hyperlipidemia: Secondary | ICD-10-CM | POA: Diagnosis not present

## 2023-09-07 DIAGNOSIS — M1711 Unilateral primary osteoarthritis, right knee: Secondary | ICD-10-CM | POA: Diagnosis not present

## 2023-09-07 DIAGNOSIS — I10 Essential (primary) hypertension: Secondary | ICD-10-CM | POA: Diagnosis not present

## 2023-09-11 DIAGNOSIS — I272 Pulmonary hypertension, unspecified: Secondary | ICD-10-CM | POA: Diagnosis not present

## 2023-09-11 DIAGNOSIS — Z7982 Long term (current) use of aspirin: Secondary | ICD-10-CM | POA: Diagnosis not present

## 2023-09-11 DIAGNOSIS — N289 Disorder of kidney and ureter, unspecified: Secondary | ICD-10-CM | POA: Diagnosis not present

## 2023-09-11 DIAGNOSIS — Z602 Problems related to living alone: Secondary | ICD-10-CM | POA: Diagnosis not present

## 2023-09-11 DIAGNOSIS — G8929 Other chronic pain: Secondary | ICD-10-CM | POA: Diagnosis not present

## 2023-09-11 DIAGNOSIS — M1711 Unilateral primary osteoarthritis, right knee: Secondary | ICD-10-CM | POA: Diagnosis not present

## 2023-09-11 DIAGNOSIS — M25511 Pain in right shoulder: Secondary | ICD-10-CM | POA: Diagnosis not present

## 2023-09-11 DIAGNOSIS — I2510 Atherosclerotic heart disease of native coronary artery without angina pectoris: Secondary | ICD-10-CM | POA: Diagnosis not present

## 2023-09-11 DIAGNOSIS — E782 Mixed hyperlipidemia: Secondary | ICD-10-CM | POA: Diagnosis not present

## 2023-09-11 DIAGNOSIS — I10 Essential (primary) hypertension: Secondary | ICD-10-CM | POA: Diagnosis not present

## 2023-09-11 DIAGNOSIS — I252 Old myocardial infarction: Secondary | ICD-10-CM | POA: Diagnosis not present

## 2023-09-11 DIAGNOSIS — M80061D Age-related osteoporosis with current pathological fracture, right lower leg, subsequent encounter for fracture with routine healing: Secondary | ICD-10-CM | POA: Diagnosis not present

## 2023-09-11 DIAGNOSIS — M48061 Spinal stenosis, lumbar region without neurogenic claudication: Secondary | ICD-10-CM | POA: Diagnosis not present

## 2023-09-18 DIAGNOSIS — Z7982 Long term (current) use of aspirin: Secondary | ICD-10-CM | POA: Diagnosis not present

## 2023-09-18 DIAGNOSIS — Z602 Problems related to living alone: Secondary | ICD-10-CM | POA: Diagnosis not present

## 2023-09-18 DIAGNOSIS — M25511 Pain in right shoulder: Secondary | ICD-10-CM | POA: Diagnosis not present

## 2023-09-18 DIAGNOSIS — M80061D Age-related osteoporosis with current pathological fracture, right lower leg, subsequent encounter for fracture with routine healing: Secondary | ICD-10-CM | POA: Diagnosis not present

## 2023-09-18 DIAGNOSIS — G8929 Other chronic pain: Secondary | ICD-10-CM | POA: Diagnosis not present

## 2023-09-18 DIAGNOSIS — M1711 Unilateral primary osteoarthritis, right knee: Secondary | ICD-10-CM | POA: Diagnosis not present

## 2023-09-18 DIAGNOSIS — I252 Old myocardial infarction: Secondary | ICD-10-CM | POA: Diagnosis not present

## 2023-09-18 DIAGNOSIS — I272 Pulmonary hypertension, unspecified: Secondary | ICD-10-CM | POA: Diagnosis not present

## 2023-09-18 DIAGNOSIS — I2510 Atherosclerotic heart disease of native coronary artery without angina pectoris: Secondary | ICD-10-CM | POA: Diagnosis not present

## 2023-09-18 DIAGNOSIS — N289 Disorder of kidney and ureter, unspecified: Secondary | ICD-10-CM | POA: Diagnosis not present

## 2023-09-18 DIAGNOSIS — E782 Mixed hyperlipidemia: Secondary | ICD-10-CM | POA: Diagnosis not present

## 2023-09-18 DIAGNOSIS — M48061 Spinal stenosis, lumbar region without neurogenic claudication: Secondary | ICD-10-CM | POA: Diagnosis not present

## 2023-09-18 DIAGNOSIS — I10 Essential (primary) hypertension: Secondary | ICD-10-CM | POA: Diagnosis not present

## 2023-09-20 ENCOUNTER — Ambulatory Visit (HOSPITAL_COMMUNITY)
Admission: RE | Admit: 2023-09-20 | Discharge: 2023-09-20 | Disposition: A | Discharge status: Home / Self Care | Source: Ambulatory Visit | Attending: Cardiovascular Disease | Admitting: Cardiovascular Disease

## 2023-09-20 DIAGNOSIS — R600 Localized edema: Secondary | ICD-10-CM | POA: Diagnosis not present

## 2023-09-20 LAB — ECHOCARDIOGRAM COMPLETE
Area-P 1/2: 2.37 cm2
S' Lateral: 1.8 cm

## 2023-09-25 DIAGNOSIS — M25511 Pain in right shoulder: Secondary | ICD-10-CM | POA: Diagnosis not present

## 2023-09-25 DIAGNOSIS — Z602 Problems related to living alone: Secondary | ICD-10-CM | POA: Diagnosis not present

## 2023-09-25 DIAGNOSIS — E782 Mixed hyperlipidemia: Secondary | ICD-10-CM | POA: Diagnosis not present

## 2023-09-25 DIAGNOSIS — I2510 Atherosclerotic heart disease of native coronary artery without angina pectoris: Secondary | ICD-10-CM | POA: Diagnosis not present

## 2023-09-25 DIAGNOSIS — I272 Pulmonary hypertension, unspecified: Secondary | ICD-10-CM | POA: Diagnosis not present

## 2023-09-25 DIAGNOSIS — M1711 Unilateral primary osteoarthritis, right knee: Secondary | ICD-10-CM | POA: Diagnosis not present

## 2023-09-25 DIAGNOSIS — N289 Disorder of kidney and ureter, unspecified: Secondary | ICD-10-CM | POA: Diagnosis not present

## 2023-09-25 DIAGNOSIS — I252 Old myocardial infarction: Secondary | ICD-10-CM | POA: Diagnosis not present

## 2023-09-25 DIAGNOSIS — I10 Essential (primary) hypertension: Secondary | ICD-10-CM | POA: Diagnosis not present

## 2023-09-25 DIAGNOSIS — M80061D Age-related osteoporosis with current pathological fracture, right lower leg, subsequent encounter for fracture with routine healing: Secondary | ICD-10-CM | POA: Diagnosis not present

## 2023-09-25 DIAGNOSIS — Z7982 Long term (current) use of aspirin: Secondary | ICD-10-CM | POA: Diagnosis not present

## 2023-09-25 DIAGNOSIS — M48061 Spinal stenosis, lumbar region without neurogenic claudication: Secondary | ICD-10-CM | POA: Diagnosis not present

## 2023-09-25 DIAGNOSIS — G8929 Other chronic pain: Secondary | ICD-10-CM | POA: Diagnosis not present

## 2023-09-26 DIAGNOSIS — G8929 Other chronic pain: Secondary | ICD-10-CM | POA: Diagnosis not present

## 2023-09-26 DIAGNOSIS — I10 Essential (primary) hypertension: Secondary | ICD-10-CM | POA: Diagnosis not present

## 2023-09-26 DIAGNOSIS — I252 Old myocardial infarction: Secondary | ICD-10-CM | POA: Diagnosis not present

## 2023-09-26 DIAGNOSIS — Z7982 Long term (current) use of aspirin: Secondary | ICD-10-CM | POA: Diagnosis not present

## 2023-09-26 DIAGNOSIS — I272 Pulmonary hypertension, unspecified: Secondary | ICD-10-CM | POA: Diagnosis not present

## 2023-09-26 DIAGNOSIS — M25511 Pain in right shoulder: Secondary | ICD-10-CM | POA: Diagnosis not present

## 2023-09-26 DIAGNOSIS — N289 Disorder of kidney and ureter, unspecified: Secondary | ICD-10-CM | POA: Diagnosis not present

## 2023-09-26 DIAGNOSIS — M1711 Unilateral primary osteoarthritis, right knee: Secondary | ICD-10-CM | POA: Diagnosis not present

## 2023-09-26 DIAGNOSIS — E782 Mixed hyperlipidemia: Secondary | ICD-10-CM | POA: Diagnosis not present

## 2023-09-26 DIAGNOSIS — M80061D Age-related osteoporosis with current pathological fracture, right lower leg, subsequent encounter for fracture with routine healing: Secondary | ICD-10-CM | POA: Diagnosis not present

## 2023-09-26 DIAGNOSIS — M48061 Spinal stenosis, lumbar region without neurogenic claudication: Secondary | ICD-10-CM | POA: Diagnosis not present

## 2023-09-26 DIAGNOSIS — Z602 Problems related to living alone: Secondary | ICD-10-CM | POA: Diagnosis not present

## 2023-09-26 DIAGNOSIS — I2510 Atherosclerotic heart disease of native coronary artery without angina pectoris: Secondary | ICD-10-CM | POA: Diagnosis not present

## 2023-09-27 ENCOUNTER — Ambulatory Visit: Attending: Cardiology | Admitting: Cardiology

## 2023-09-27 ENCOUNTER — Encounter: Payer: Self-pay | Admitting: Cardiology

## 2023-09-27 VITALS — BP 142/74 | HR 70 | Ht 60.0 in | Wt 111.4 lb

## 2023-09-27 DIAGNOSIS — I2510 Atherosclerotic heart disease of native coronary artery without angina pectoris: Secondary | ICD-10-CM

## 2023-09-27 DIAGNOSIS — E782 Mixed hyperlipidemia: Secondary | ICD-10-CM

## 2023-09-27 MED ORDER — FUROSEMIDE 40 MG PO TABS: 40.0000 mg | ORAL_TABLET | ORAL | 2 refills | Status: AC | PRN

## 2023-09-27 MED ORDER — METOPROLOL TARTRATE 25 MG PO TABS: ORAL_TABLET | ORAL | 0 refills | Status: AC

## 2023-09-27 NOTE — Patient Instructions (Signed)
 Medication Instructions:  STOP Amlodipine  START Furosemide  as needed for fluid retention  TAPER Metoprolol :  Take 12.5 mg (half tablet) twice a day for 7 days. Then take 12.5 mg (half tablet) once a day for 7 days. Then stop medication.  *If you need a refill on your cardiac medications before your next appointment, please call your pharmacy*  Follow-Up: At [facility], you and your health needs are our priority.  As part of our continuing mission to provide you with exceptional heart care, our providers are all part of one team.  This team includes your primary Cardiologist (physician) and Advanced Practice Providers or APPs (Physician Assistants and Nurse Practitioners) who all work together to provide you with the care you need, when you need it.  Your next appointment:   1 year  Provider:   Newman JINNY Lawrence, MD    We recommend signing up for the patient portal called MyChart.  Sign up information is provided on this After Visit Summary.  MyChart is used to connect with patients for Virtual Visits (Telemedicine).  Patients are able to view lab/test results, encounter notes, upcoming appointments, etc.  Non-urgent messages can be sent to your provider as well.   To learn more about what you can do with MyChart, go to [email or web address].

## 2023-09-27 NOTE — Progress Notes (Signed)
 Cardiology Office Note:  .   Date:  09/27/2023  ID:  [facility], DOB 18-Feb-1931, MRN [id number] PCP: Cyrena Gwenn SQUIBB, MD (Inactive)  [facility] Providers Cardiologist:  Newman Lawrence, MD PCP: Cyrena Gwenn SQUIBB, MD (Inactive)  Chief Complaint  Patient presents with   Hypertension   Hyperlipidemia     [facility] is a 89 y.o. female with hypertension, hyperlipidemia, CKD3, arthritis, CAD,   History of Present Illness  Patient is doing well.  She is now walking with a walker.  Right leg swelling persists, but DVT and palpable distal emergency.  On recent workup.  She is not taking Lasix .  Denies any chest pain or dyspnea.   Vitals:   09/27/23 1506  BP: (!) 142/74  Pulse: 70  SpO2: 97%  Blood pressure slightly elevated today, but much lower at home.     Review of Systems  Cardiovascular:  Positive for leg swelling. Negative for chest pain, dyspnea on exertion, palpitations and syncope.        Studies Reviewed: SABRA        Labs 08/21/2023: Cr 1.17 ProBNP 399 (normal)  03/2023: Chol 168, TG 119, HDL 72, LDL 75 Hb 14.5 Cr 1.5  Echocardiogram 09/20/2023: 1. Left ventricular ejection fraction, by estimation, is 65 to 70%. Left  ventricular ejection fraction by 3D volume is 68 %. The left ventricle has  normal function. The left ventricle has no regional wall motion  abnormalities. There is mild concentric  left ventricular hypertrophy. Left ventricular diastolic parameters are  consistent with Grade I diastolic dysfunction (impaired relaxation).   2. Right ventricular systolic function is normal. The right ventricular  size is normal.   3. Left atrial size was mildly dilated.   4. The mitral valve is normal in structure. Trivial mitral valve  regurgitation. No evidence of mitral stenosis.   5. The aortic valve is tricuspid. There is mild calcification of the  aortic valve. Aortic valve regurgitation is not visualized. Aortic valve   sclerosis/calcification is present, without any evidence of aortic  stenosis.   6. The inferior vena cava is normal in size with greater than 50%  respiratory variability, suggesting right atrial pressure of 3 mmHg.   Vascular ultrasound 07/2023: RIGHT:  - No evidence of deep vein thrombosis in the lower extremity. No indirect  evidence of obstruction proximal to the inguinal ligament.   - A cystic structure is found in the popliteal fossa.  LEFT:  - No evidence of common femoral vein obstruction.      Physical Exam Vitals and nursing note reviewed.  Constitutional:      General: She is not in acute distress. Neck:     Vascular: No JVD.  Cardiovascular:     Rate and Rhythm: Normal rate and regular rhythm.     Heart sounds: Normal heart sounds. No murmur heard. Pulmonary:     Effort: Pulmonary effort is normal.     Breath sounds: Normal breath sounds. No wheezing or rales.  Musculoskeletal:     Right lower leg: Edema (1+) present.     Left lower leg: No edema.      VISIT DIAGNOSES:   ICD-10-CM   1. Coronary artery disease involving native coronary artery of native heart without angina pectoris  I25.10     2. Mixed hyperlipidemia  E78.2         [facility] is a 89 y.o. female with hypertension, hyperlipidemia, CKD3, arthritis, CAD Assessment & Plan  CAD: NSTEMI 09/2018  fromSubacute LCx occlusion treated with complex PCI (overlapping stents Synergy DES 3.0 X 28 mm & Synergy DES 2.5 X 8 mm). No recurrent anginal symptoms.  Continue Aspirin  81 mg, Crestor  10 mg daily with fairly well-controlled lipids. I do not think she needs metoprolol  at this time.  Been off metoprolol  25 twice daily by taking half tablet twice a day for 7 days, then half tablet daily for 7 days, then stop.    Leg edema: No acute congestive heart failure, no DVT.  Only mild PH on echocardiogram in 2023.   Right leg edema likely related to recent injury. Reasonable to take Lasix  only as  needed.   Hypertension: Suspect component of whitecoat hypertension.  She is not taking amlodipine  at home with much lower blood pressures.  Monitor for now.    I wished her happy early 89st birthday!  Meds ordered this encounter  Medications   furosemide  (LASIX ) 40 MG tablet    Sig: Take 1 tablet (40 mg total) by mouth as needed for fluid.    Dispense:  30 tablet    Refill:  2   metoprolol  tartrate (LOPRESSOR ) 25 MG tablet    Sig: Take 0.5 tablets (12.5 mg total) by mouth 2 (two) times daily for 7 days, THEN 0.5 tablets (12.5 mg total) daily for 7 days. Then stop.    Dispense:  180 tablet    Refill:  0     F/u in 1 year   Signed, Newman JINNY Lawrence, MD

## 2023-10-02 MED ORDER — AMLODIPINE BESYLATE 5 MG PO TABS: 5.0000 mg | ORAL_TABLET | Freq: Every day | ORAL | 3 refills | Status: DC

## 2023-10-02 NOTE — Telephone Encounter (Signed)
 Please refill amlodipine  at 5 mg daily.  Thanks MJP

## 2023-10-04 DIAGNOSIS — M1711 Unilateral primary osteoarthritis, right knee: Secondary | ICD-10-CM | POA: Diagnosis not present

## 2023-10-04 DIAGNOSIS — M25511 Pain in right shoulder: Secondary | ICD-10-CM | POA: Diagnosis not present

## 2023-10-04 DIAGNOSIS — G8929 Other chronic pain: Secondary | ICD-10-CM | POA: Diagnosis not present

## 2023-10-04 DIAGNOSIS — Z7982 Long term (current) use of aspirin: Secondary | ICD-10-CM | POA: Diagnosis not present

## 2023-10-04 DIAGNOSIS — M80061D Age-related osteoporosis with current pathological fracture, right lower leg, subsequent encounter for fracture with routine healing: Secondary | ICD-10-CM | POA: Diagnosis not present

## 2023-10-04 DIAGNOSIS — N289 Disorder of kidney and ureter, unspecified: Secondary | ICD-10-CM | POA: Diagnosis not present

## 2023-10-04 DIAGNOSIS — I10 Essential (primary) hypertension: Secondary | ICD-10-CM | POA: Diagnosis not present

## 2023-10-04 DIAGNOSIS — M48061 Spinal stenosis, lumbar region without neurogenic claudication: Secondary | ICD-10-CM | POA: Diagnosis not present

## 2023-10-04 DIAGNOSIS — I2510 Atherosclerotic heart disease of native coronary artery without angina pectoris: Secondary | ICD-10-CM | POA: Diagnosis not present

## 2023-10-04 DIAGNOSIS — E782 Mixed hyperlipidemia: Secondary | ICD-10-CM | POA: Diagnosis not present

## 2023-10-04 DIAGNOSIS — I272 Pulmonary hypertension, unspecified: Secondary | ICD-10-CM | POA: Diagnosis not present

## 2023-10-04 DIAGNOSIS — I252 Old myocardial infarction: Secondary | ICD-10-CM | POA: Diagnosis not present

## 2023-10-04 DIAGNOSIS — Z602 Problems related to living alone: Secondary | ICD-10-CM | POA: Diagnosis not present

## 2023-10-09 DIAGNOSIS — M48061 Spinal stenosis, lumbar region without neurogenic claudication: Secondary | ICD-10-CM | POA: Diagnosis not present

## 2023-10-09 DIAGNOSIS — I2510 Atherosclerotic heart disease of native coronary artery without angina pectoris: Secondary | ICD-10-CM | POA: Diagnosis not present

## 2023-10-09 DIAGNOSIS — E782 Mixed hyperlipidemia: Secondary | ICD-10-CM | POA: Diagnosis not present

## 2023-10-09 DIAGNOSIS — Z602 Problems related to living alone: Secondary | ICD-10-CM | POA: Diagnosis not present

## 2023-10-09 DIAGNOSIS — M80061D Age-related osteoporosis with current pathological fracture, right lower leg, subsequent encounter for fracture with routine healing: Secondary | ICD-10-CM | POA: Diagnosis not present

## 2023-10-09 DIAGNOSIS — Z7982 Long term (current) use of aspirin: Secondary | ICD-10-CM | POA: Diagnosis not present

## 2023-10-09 DIAGNOSIS — I272 Pulmonary hypertension, unspecified: Secondary | ICD-10-CM | POA: Diagnosis not present

## 2023-10-09 DIAGNOSIS — M1711 Unilateral primary osteoarthritis, right knee: Secondary | ICD-10-CM | POA: Diagnosis not present

## 2023-10-09 DIAGNOSIS — I10 Essential (primary) hypertension: Secondary | ICD-10-CM | POA: Diagnosis not present

## 2023-10-09 DIAGNOSIS — G8929 Other chronic pain: Secondary | ICD-10-CM | POA: Diagnosis not present

## 2023-10-09 DIAGNOSIS — I252 Old myocardial infarction: Secondary | ICD-10-CM | POA: Diagnosis not present

## 2023-10-09 DIAGNOSIS — M25511 Pain in right shoulder: Secondary | ICD-10-CM | POA: Diagnosis not present

## 2023-10-09 DIAGNOSIS — N289 Disorder of kidney and ureter, unspecified: Secondary | ICD-10-CM | POA: Diagnosis not present

## 2023-10-13 NOTE — Telephone Encounter (Signed)
 Let us  go back to amlodipine  and metoprolol . To make it easier, we can use metoprolol  succinate 50 mg daily (as opposed to tartrate 25 mg bid) along with amlodipine  5 mg daily. Please send refills.  Thanks MJP

## 2023-10-16 MED ORDER — METOPROLOL SUCCINATE ER 50 MG PO TB24: 50.0000 mg | ORAL_TABLET | Freq: Every day | ORAL | 3 refills | Status: AC

## 2023-10-16 MED ORDER — AMLODIPINE BESYLATE 5 MG PO TABS
5.0000 mg | ORAL_TABLET | Freq: Every day | ORAL | 3 refills | Status: AC
Start: 2023-10-16 — End: ?

## 2023-10-16 NOTE — Addendum Note (Signed)
 Addended by: MANDA BOTTCHER B on: 10/16/2023 12:28 PM   Modules accepted: Orders

## 2023-10-17 DIAGNOSIS — I272 Pulmonary hypertension, unspecified: Secondary | ICD-10-CM | POA: Diagnosis not present

## 2023-10-17 DIAGNOSIS — I252 Old myocardial infarction: Secondary | ICD-10-CM | POA: Diagnosis not present

## 2023-10-17 DIAGNOSIS — Z602 Problems related to living alone: Secondary | ICD-10-CM | POA: Diagnosis not present

## 2023-10-17 DIAGNOSIS — M1711 Unilateral primary osteoarthritis, right knee: Secondary | ICD-10-CM | POA: Diagnosis not present

## 2023-10-17 DIAGNOSIS — G8929 Other chronic pain: Secondary | ICD-10-CM | POA: Diagnosis not present

## 2023-10-17 DIAGNOSIS — M48061 Spinal stenosis, lumbar region without neurogenic claudication: Secondary | ICD-10-CM | POA: Diagnosis not present

## 2023-10-17 DIAGNOSIS — I2510 Atherosclerotic heart disease of native coronary artery without angina pectoris: Secondary | ICD-10-CM | POA: Diagnosis not present

## 2023-10-17 DIAGNOSIS — I10 Essential (primary) hypertension: Secondary | ICD-10-CM | POA: Diagnosis not present

## 2023-10-17 DIAGNOSIS — N289 Disorder of kidney and ureter, unspecified: Secondary | ICD-10-CM | POA: Diagnosis not present

## 2023-10-17 DIAGNOSIS — Z7982 Long term (current) use of aspirin: Secondary | ICD-10-CM | POA: Diagnosis not present

## 2023-10-17 DIAGNOSIS — E782 Mixed hyperlipidemia: Secondary | ICD-10-CM | POA: Diagnosis not present

## 2023-10-17 DIAGNOSIS — M25511 Pain in right shoulder: Secondary | ICD-10-CM | POA: Diagnosis not present

## 2023-10-17 DIAGNOSIS — M80061D Age-related osteoporosis with current pathological fracture, right lower leg, subsequent encounter for fracture with routine healing: Secondary | ICD-10-CM | POA: Diagnosis not present

## 2023-10-19 DIAGNOSIS — I2510 Atherosclerotic heart disease of native coronary artery without angina pectoris: Secondary | ICD-10-CM | POA: Diagnosis not present

## 2023-10-19 DIAGNOSIS — M25511 Pain in right shoulder: Secondary | ICD-10-CM | POA: Diagnosis not present

## 2023-10-19 DIAGNOSIS — E782 Mixed hyperlipidemia: Secondary | ICD-10-CM | POA: Diagnosis not present

## 2023-10-19 DIAGNOSIS — I272 Pulmonary hypertension, unspecified: Secondary | ICD-10-CM | POA: Diagnosis not present

## 2023-10-19 DIAGNOSIS — Z7982 Long term (current) use of aspirin: Secondary | ICD-10-CM | POA: Diagnosis not present

## 2023-10-19 DIAGNOSIS — G8929 Other chronic pain: Secondary | ICD-10-CM | POA: Diagnosis not present

## 2023-10-19 DIAGNOSIS — N289 Disorder of kidney and ureter, unspecified: Secondary | ICD-10-CM | POA: Diagnosis not present

## 2023-10-19 DIAGNOSIS — I252 Old myocardial infarction: Secondary | ICD-10-CM | POA: Diagnosis not present

## 2023-10-19 DIAGNOSIS — M48061 Spinal stenosis, lumbar region without neurogenic claudication: Secondary | ICD-10-CM | POA: Diagnosis not present

## 2023-10-19 DIAGNOSIS — M80061D Age-related osteoporosis with current pathological fracture, right lower leg, subsequent encounter for fracture with routine healing: Secondary | ICD-10-CM | POA: Diagnosis not present

## 2023-10-19 DIAGNOSIS — Z602 Problems related to living alone: Secondary | ICD-10-CM | POA: Diagnosis not present

## 2023-10-19 DIAGNOSIS — M1711 Unilateral primary osteoarthritis, right knee: Secondary | ICD-10-CM | POA: Diagnosis not present

## 2023-10-19 DIAGNOSIS — I10 Essential (primary) hypertension: Secondary | ICD-10-CM | POA: Diagnosis not present

## 2023-10-25 DIAGNOSIS — N1832 Chronic kidney disease, stage 3b: Secondary | ICD-10-CM | POA: Diagnosis not present

## 2023-10-25 DIAGNOSIS — S82141S Displaced bicondylar fracture of right tibia, sequela: Secondary | ICD-10-CM | POA: Diagnosis not present

## 2023-10-25 DIAGNOSIS — E782 Mixed hyperlipidemia: Secondary | ICD-10-CM | POA: Diagnosis not present

## 2023-10-25 DIAGNOSIS — I10 Essential (primary) hypertension: Secondary | ICD-10-CM | POA: Diagnosis not present

## 2023-10-25 DIAGNOSIS — Z955 Presence of coronary angioplasty implant and graft: Secondary | ICD-10-CM | POA: Diagnosis not present

## 2023-10-25 DIAGNOSIS — M810 Age-related osteoporosis without current pathological fracture: Secondary | ICD-10-CM | POA: Diagnosis not present

## 2023-10-29 DIAGNOSIS — E782 Mixed hyperlipidemia: Secondary | ICD-10-CM | POA: Diagnosis not present

## 2024-02-08 ENCOUNTER — Other Ambulatory Visit: Payer: Self-pay

## 2024-02-08 ENCOUNTER — Emergency Department (HOSPITAL_BASED_OUTPATIENT_CLINIC_OR_DEPARTMENT_OTHER)

## 2024-02-08 ENCOUNTER — Encounter (HOSPITAL_BASED_OUTPATIENT_CLINIC_OR_DEPARTMENT_OTHER): Payer: Self-pay

## 2024-02-08 ENCOUNTER — Emergency Department (HOSPITAL_BASED_OUTPATIENT_CLINIC_OR_DEPARTMENT_OTHER)
Admission: EM | Admit: 2024-02-08 | Discharge: 2024-02-08 | Disposition: A | Discharge status: Home / Self Care | Attending: Emergency Medicine | Admitting: Emergency Medicine

## 2024-02-08 DIAGNOSIS — Z91040 Latex allergy status: Secondary | ICD-10-CM | POA: Diagnosis not present

## 2024-02-08 DIAGNOSIS — D72829 Elevated white blood cell count, unspecified: Secondary | ICD-10-CM | POA: Diagnosis not present

## 2024-02-08 DIAGNOSIS — R748 Abnormal levels of other serum enzymes: Secondary | ICD-10-CM | POA: Insufficient documentation

## 2024-02-08 DIAGNOSIS — M545 Low back pain, unspecified: Secondary | ICD-10-CM | POA: Diagnosis not present

## 2024-02-08 DIAGNOSIS — K5730 Diverticulosis of large intestine without perforation or abscess without bleeding: Secondary | ICD-10-CM | POA: Diagnosis not present

## 2024-02-08 DIAGNOSIS — N189 Chronic kidney disease, unspecified: Secondary | ICD-10-CM | POA: Insufficient documentation

## 2024-02-08 DIAGNOSIS — M4316 Spondylolisthesis, lumbar region: Secondary | ICD-10-CM | POA: Diagnosis not present

## 2024-02-08 DIAGNOSIS — Z7982 Long term (current) use of aspirin: Secondary | ICD-10-CM | POA: Insufficient documentation

## 2024-02-08 DIAGNOSIS — M47816 Spondylosis without myelopathy or radiculopathy, lumbar region: Secondary | ICD-10-CM | POA: Diagnosis not present

## 2024-02-08 DIAGNOSIS — K5900 Constipation, unspecified: Secondary | ICD-10-CM | POA: Insufficient documentation

## 2024-02-08 DIAGNOSIS — K838 Other specified diseases of biliary tract: Secondary | ICD-10-CM | POA: Diagnosis not present

## 2024-02-08 DIAGNOSIS — M4807 Spinal stenosis, lumbosacral region: Secondary | ICD-10-CM | POA: Diagnosis not present

## 2024-02-08 DIAGNOSIS — K828 Other specified diseases of gallbladder: Secondary | ICD-10-CM | POA: Diagnosis not present

## 2024-02-08 DIAGNOSIS — K8020 Calculus of gallbladder without cholecystitis without obstruction: Secondary | ICD-10-CM | POA: Diagnosis not present

## 2024-02-08 DIAGNOSIS — R1013 Epigastric pain: Secondary | ICD-10-CM | POA: Diagnosis not present

## 2024-02-08 DIAGNOSIS — M48061 Spinal stenosis, lumbar region without neurogenic claudication: Secondary | ICD-10-CM | POA: Diagnosis not present

## 2024-02-08 LAB — COMPREHENSIVE METABOLIC PANEL WITH GFR
ALT: 15 U/L (ref 0–44)
AST: 30 U/L (ref 15–41)
Albumin: 4.3 g/dL (ref 3.5–5.0)
Alkaline Phosphatase: 136 U/L — ABNORMAL HIGH (ref 38–126)
Anion gap: 14 (ref 5–15)
BUN: 30 mg/dL — ABNORMAL HIGH (ref 8–23)
CO2: 25 mmol/L (ref 22–32)
Calcium: 10.3 mg/dL (ref 8.9–10.3)
Chloride: 98 mmol/L (ref 98–111)
Creatinine, Ser: 1.27 mg/dL — ABNORMAL HIGH (ref 0.44–1.00)
GFR, Estimated: 40 mL/min — ABNORMAL LOW (ref >60)
Glucose, Bld: 110 mg/dL — ABNORMAL HIGH (ref 70–99)
Potassium: 3.8 mmol/L (ref 3.5–5.1)
Sodium: 137 mmol/L (ref 135–145)
Total Bilirubin: 1.4 mg/dL — ABNORMAL HIGH (ref 0.0–1.2)
Total Protein: 8.1 g/dL (ref 6.5–8.1)

## 2024-02-08 LAB — CBC WITH DIFFERENTIAL/PLATELET
Abs Immature Granulocytes: 0.04 K/uL (ref 0.00–0.07)
Basophils Absolute: 0.1 K/uL (ref 0.0–0.1)
Basophils Relative: 0 %
Eosinophils Absolute: 0 K/uL (ref 0.0–0.5)
Eosinophils Relative: 0 %
HCT: 40.2 % (ref 36.0–46.0)
Hemoglobin: 13.8 g/dL (ref 12.0–15.0)
Immature Granulocytes: 0 %
Lymphocytes Relative: 9 %
Lymphs Abs: 1.1 K/uL (ref 0.7–4.0)
MCH: 31.8 pg (ref 26.0–34.0)
MCHC: 34.3 g/dL (ref 30.0–36.0)
MCV: 92.6 fL (ref 80.0–100.0)
Monocytes Absolute: 1.5 K/uL — ABNORMAL HIGH (ref 0.1–1.0)
Monocytes Relative: 13 %
Neutro Abs: 8.8 K/uL — ABNORMAL HIGH (ref 1.7–7.7)
Neutrophils Relative %: 78 %
Platelets: 272 K/uL (ref 150–400)
RBC: 4.34 MIL/uL (ref 3.87–5.11)
RDW: 13.3 % (ref 11.5–15.5)
WBC: 11.5 K/uL — ABNORMAL HIGH (ref 4.0–10.5)
nRBC: 0 % (ref 0.0–0.2)

## 2024-02-08 LAB — LIPASE, BLOOD: Lipase: 39 U/L (ref 11–51)

## 2024-02-08 MED ORDER — POLYETHYLENE GLYCOL 3350 17 G PO PACK: 17.0000 g | PACK | Freq: Two times a day (BID) | ORAL | 0 refills | Status: AC

## 2024-02-08 MED ORDER — GLYCERIN (ADULT) 2 G RE SUPP: 1.0000 | RECTAL | 0 refills | Status: AC | PRN

## 2024-02-08 NOTE — ED Notes (Signed)
 Reviewed discharge instructions, medications, and home care with pt. Pt verbalized understanding and had no further questions. Pt exited ED without complications.

## 2024-02-08 NOTE — Discharge Instructions (Addendum)
 You were seen for your back pain in the emergency department.   At home, please take Tylenol  and use lidocaine  patches for back pain.  Take the MiraLAX  twice daily for constipation and then changed to once daily.  If you do not have a bowel movement you may use the suppositories we have prescribed you.    Check your MyChart online for the results of any tests that had not resulted by the time you left the emergency department.   Follow-up with your primary doctor in 2-3 days regarding your visit.  Talk to them about your bile duct that is dilated. follow-up with the spine doctors about your back pain.  Return immediately to the emergency department if you experience any of the following: Bowel or bladder incontinence, numbness or weakness of your legs, vomiting, severe abdominal pain, or any other concerning symptoms.    Thank you for visiting our Emergency Department. It was a pleasure taking care of you today.

## 2024-02-08 NOTE — ED Triage Notes (Signed)
 Pt reports onset of severe lower back pain Wednesday morning that radiates around to abdomen bilaterally. Pt suspicious of what she ate Tuesday night. Also reports no bowel movement since Wednesday. Endorses mild nausea, -V. Reports taking stool softeners x2 days and home interventions w/ no relief.

## 2024-02-08 NOTE — ED Provider Notes (Signed)
 [facility] Rogers EMERGENCY DEPARTMENT AT [facility] Provider Note   CSN: [id number] Arrival date & time: 02/08/24  9044     Patient presents with: Back Pain and Constipation   Tracy Rogers is a 89 y.o. female.   89 year old female with a history of spinal stenosis, sciatica, lumbar herniated disc, and CKD who presents to the emergency department back pain and abdominal pain.  Patient reports that on Wednesday she ate something that she thought may have aggravated her stomach.  Woke up the next day when she was going to sit up from bed had severe mid to lower back pain that wraps around her abdomen into her stomach.  Worsened with movement.  No bowel or bladder incontinence.  No leg numbness or weakness is new.  She is on blood thinners.  No history of cancer.  No abdominal surgeries.  Has not had a bowel movement in 3 days but is still passing gas.  Did have an episode of nausea and vomiting yesterday.  Has been trying laxatives without relief       Prior to Admission medications   Medication Sig Start Date End Date Taking? Authorizing Provider  glycerin adult 2 g suppository Place 1 suppository rectally as needed for constipation. 02/08/24  Yes Yolande Lamar BROCKS, MD  polyethylene glycol (MIRALAX ) 17 g packet Take 17 g by mouth 2 (two) times daily. 02/08/24  Yes Yolande Lamar BROCKS, MD  amLODipine  (NORVASC ) 5 MG tablet Take 1 tablet (5 mg total) by mouth daily. 10/16/23   Patwardhan, Newman PARAS, MD  aspirin  EC ([employer] ASPIRIN  LOW DOSE) 81 MG tablet Take 1 tablet (81 mg total) by mouth daily. SWALLOW WHOLE. 01/25/22   Patwardhan, Newman PARAS, MD  Coenzyme Q10 (COQ10) 200 MG CAPS Take 200 mg by mouth in the morning and at bedtime.    [provider]  furosemide  (LASIX ) 40 MG tablet Take 1 tablet (40 mg total) by mouth as needed for fluid. 09/27/23   Patwardhan, Newman PARAS, MD  Lutein  40 MG CAPS Take 40 mg by mouth daily.    [provider]  metoprolol  succinate (TOPROL -XL)  50 MG 24 hr tablet Take 1 tablet (50 mg total) by mouth daily. Take with or immediately following a meal. 10/16/23   Patwardhan, Newman PARAS, MD  metoprolol  tartrate (LOPRESSOR ) 25 MG tablet Take 0.5 tablets (12.5 mg total) by mouth 2 (two) times daily for 7 days, THEN 0.5 tablets (12.5 mg total) daily for 7 days. Then stop. 09/27/23 10/11/23  Patwardhan, Newman PARAS, MD  nitroGLYCERIN  (NITROSTAT ) 0.4 MG SL tablet Place 1 tablet (0.4 mg total) under the tongue every 5 (five) minutes as needed for chest pain. 03/03/22   Patwardhan, Newman PARAS, MD  rosuvastatin  (CRESTOR ) 10 MG tablet TAKE 1 TABLET BY MOUTH EVERY DAY 09/30/21   Patwardhan, Manish J, MD    Allergies: Latex, Penicillins, Bactrim [sulfamethoxazole-trimethoprim], Benzodiazepines, Hydrocodone, Meloxicam, Other, and Oxycodone     Review of Systems  Updated Vital Signs BP (!) 177/97   Pulse 80   Temp 98 F (36.7 C)   Resp 18   Ht 5' (1.524 m)   Wt 50.8 kg   SpO2 95%   BMI 21.87 kg/m   Physical Exam Vitals and nursing note reviewed.  Constitutional:      General: She is not in acute distress.    Appearance: She is well-developed.  HENT:     Head: Normocephalic and atraumatic.     Right Ear: External ear normal.  Left Ear: External ear normal.     Nose: Nose normal.  Eyes:     Extraocular Movements: Extraocular movements intact.     Conjunctiva/sclera: Conjunctivae normal.     Pupils: Pupils are equal, round, and reactive to light.  Abdominal:     General: Abdomen is flat. There is no distension.     Palpations: Abdomen is soft. There is no mass.     Tenderness: There is no abdominal tenderness. There is no guarding.  Musculoskeletal:     Cervical back: Normal range of motion and neck supple.     Right lower leg: No edema.     Left lower leg: No edema.     Comments: No spinal midline TTP in cervical or thoracic spine.  Tenderness palpation at approximately L1-L2.  No stepoffs noted.   Motor: Muscle bulk and tone are normal.  Strength is 5/5 in hip flexion, knee flexion and extension, ankle dorsiflexion and plantar flexion bilaterally. Full strength of great toe dorsiflexion bilaterally.  Sensory: Intact sensation to light touch in L2 though S1 dermatomes bilaterally.   Skin:    General: Skin is warm and dry.  Neurological:     Mental Status: She is alert and oriented to person, place, and time. Mental status is at baseline.  Psychiatric:        Mood and Affect: Mood normal.     (all labs ordered are listed, but only abnormal results are displayed) Labs Reviewed  CBC WITH DIFFERENTIAL/PLATELET - Abnormal; Notable for the following components:      Result Value   WBC 11.5 (*)    Neutro Abs 8.8 (*)    Monocytes Absolute 1.5 (*)    All other components within normal limits  COMPREHENSIVE METABOLIC PANEL WITH GFR - Abnormal; Notable for the following components:   Glucose, Bld 110 (*)    BUN 30 (*)    Creatinine, Ser 1.27 (*)    Alkaline Phosphatase 136 (*)    Total Bilirubin 1.4 (*)    GFR, Estimated 40 (*)    All other components within normal limits  LIPASE, BLOOD    EKG: None  Radiology: US  Abdomen Limited RUQ (LIVER/GB) Result Date: 02/08/2024 EXAM: Right Upper Quadrant Abdominal Ultrasound 02/08/2024 12:17:34 PM TECHNIQUE: Real-time ultrasonography of the right upper quadrant of the abdomen was performed. COMPARISON: None available. CLINICAL HISTORY: Epigastric pain. FINDINGS: LIVER: The liver demonstrates normal echogenicity. No intrahepatic biliary ductal dilatation. No evidence of mass. BILIARY SYSTEM: The gallbladder is distended and fluid-filled with small mobile gallstones. No pericholecystic fluid or wall thickening. Negative sonographic Murphy's sign. The common bile duct is dilated up to 9 mm. RIGHT KIDNEY: The right kidney is grossly unremarkable in appearances without evidence of hydronephrosis, echogenic calculi or worrisome mass lesions. PANCREAS: Visualized portions of the pancreas are  unremarkable. OTHER: No right upper quadrant ascites. IMPRESSION: 1. Distended gallbladder containing a few small gallstones. No changes to suggest acute cholecystitis. 2. Dilated common bile duct measuring up to 9 mm, without visualized choledocholithiasis. Correlation with serum bilirubin is recommended. If elevated and there is additional clinical concern for downstream obstruction, a nonemergent multiphase abdominal MRI with iv contrast should be considered. Electronically signed by: Rogelia Myers MD 02/08/2024 12:53 PM EST RP Workstation: [id number]   CT L-SPINE NO CHARGE Result Date: 02/08/2024 EXAM: CT OF THE LUMBAR SPINE WITHOUT CONTRAST 02/08/2024 11:10:02 AM TECHNIQUE: CT of the lumbar spine was performed without the administration of intravenous contrast. Multiplanar reformatted images are provided for review.  Automated exposure control, iterative reconstruction, and/or weight based adjustment of the mA/kV was utilized to reduce the radiation dose to as low as reasonably achievable. COMPARISON: CT abdomen pelvis 04/25/2014. CLINICAL HISTORY: FINDINGS: BONES AND ALIGNMENT: Normal vertebral body heights. Lumbar lordosis is maintained. Similar trace retrolisthesis of T12 on L1 and L1 on L2. Similar grade 1 anterolisthesis of L4 on L5. Moderate dextrocurvature of the thoracolumbar spine centered at the L2-L3 level. No evidence of compression fracture or displaced fracture. Degenerative endplate osteophytes most pronounced along the left lateral aspect of L2-L3. There is mild disc space narrowing at T12-L1, moderate to severe disc space narrowing along the left aspect of L2-L3, additional moderate to severe disc space narrowing along the left aspect of L3-L4, moderate posterior disc space narrowing along the right aspect of L4-L5, and severe disc space narrowing at L5-S1 more pronounced on the right. Partially visualized right hip arthroplasty hardware. Mild degenerative changes of the sacroiliac joints.  DEGENERATIVE CHANGES: T12-L1: Retrolisthesis and small disc bulge without significant spinal canal stenosis. No significant foraminal stenosis. L1-L2: Diffuse disc bulge and mild facet arthrosis. No significant spinal canal stenosis or foraminal stenosis. L2-L3: Disc bulge and posterior osteophytes along with mild to moderate facet arthrosis and thickening of the ligamentum flavum. Mild spinal canal stenosis. Mild foraminal stenosis on the left. L3-L4: Disc bulge, mild to moderate facet arthrosis, and thickening of the ligamentum flavum. Mild spinal canal stenosis. Mild bilateral foraminal stenosis slightly greater on the left. L4-L5: Diffuse disc bulge, moderate facet arthrosis, and thickening of the ligamentum flavum. Moderate spinal canal stenosis. Mild foraminal stenosis on the right. L5-S1: Disc bulge and posterior osteophytes. Severe right and moderate left foraminal stenosis. Thickening of the ligamentum flavum. Moderate spinal canal stenosis. Severe narrowing of the right lateral recess with potential for impingement of the traversing right S1 nerve root. Moderate right and mild left foraminal stenosis. SOFT TISSUES: No acute abnormality. INCIDENTAL FINDINGS: Atherosclerosis of the abdominal aorta and branch vessels. Diverticulosis of the partially visualized descending and sigmoid colon. Cholelithiasis. IMPRESSION: 1. No acute findings. 2. Degenerative changes as above. Moderate spinal canal stenosis at L4-5 and L5-S1. Severe right lateral recess narrowing at L5-S1 with potential for impingement of the traversing right S1 nerve root. 3. Multilevel foraminal stenosis, greatest and moderate on the right at L5-S1. 4. Moderate dextroscoliosis of the thoracolumbar spine. 5. Grade 1 anterolisthesis of L4 on L5. 6. Cholelithiasis. 7. Colonic diverticulosis. Electronically signed by: Donnice Mania MD 02/08/2024 12:01 PM EST RP Workstation: [id number]   CT ABDOMEN PELVIS WO CONTRAST Result Date: 02/08/2024 EXAM:  CT ABDOMEN AND PELVIS WITHOUT CONTRAST 02/08/2024 11:10:02 AM TECHNIQUE: CT of the abdomen and pelvis was performed without the administration of intravenous contrast. Multiplanar reformatted images are provided for review. Automated exposure control, iterative reconstruction, and/or weight-based adjustment of the mA/kV was utilized to reduce the radiation dose to as low as reasonably achievable. COMPARISON: 04/25/2014 CLINICAL HISTORY: upper lumbar back pain radiating to stomach FINDINGS: LOWER CHEST: No acute abnormality. LIVER: The liver is unremarkable. GALLBLADDER AND BILE DUCTS: Cholelithiasis. No biliary ductal dilatation. SPLEEN: No acute abnormality. PANCREAS: No acute abnormality. ADRENAL GLANDS: No acute abnormality. KIDNEYS, URETERS AND BLADDER: No stones in the kidneys or ureters. No hydronephrosis. No perinephric or periureteral stranding. Urinary bladder is unremarkable. GI AND BOWEL: Stomach demonstrates no acute abnormality. Diverticulosis of descending and sigmoid colon without inflammation. There is no bowel obstruction. PERITONEUM AND RETROPERITONEUM: No ascites. No free air. VASCULATURE: Aorta is normal in caliber. Aortic atherosclerosis. LYMPH  NODES: No lymphadenopathy. REPRODUCTIVE ORGANS: No acute abnormality. BONES AND SOFT TISSUES: Status post right total hip arthroplasty. No acute osseous abnormality. No focal soft tissue abnormality. IMPRESSION: 1. No acute findings in the abdomen or pelvis. 2. Diverticulosis of the descending and sigmoid colon without evidence of inflammation. 3. Cholelithiasis without evidence of cholecystitis. Electronically signed by: Lynwood Seip MD 02/08/2024 11:31 AM EST RP Workstation: [id number]     Procedures   Medications Ordered in the ED - No data to display                                  Medical Decision Making Amount and/or Complexity of Data Reviewed Labs: ordered. Radiology: ordered.  Risk OTC drugs.   Tracy Rogers is a  89 year old female with a history of spinal stenosis, sciatica, lumbar herniated disc, and CKD who presents to the emergency department back pain and abdominal pain.   Initial Ddx:  Pathologic fracture, spinal cord compression, pancreatitis, cholecystitis, gastritis  MDM/Course:  Patient resents emergency department with back pain and abdominal pain.  Started when she was trying to sit up and had mid to lower back pain that radiates around to her abdomen on both sides.  No signs of spinal cord compression such as bowel or bladder incontinence or weakness or numbness of her legs.  Is also complaining of some constipation and had an episode of nausea and vomiting.  She is neurovascularly intact in both of her legs.  Does have some midline tenderness to palpation at L1-L2.  No significant abdominal tenderness to palpation.  Labs show renal function that is at her baseline.  Has very minimal elevation of her alk phos to 136 and total bilirubin to 1.4.  White blood cell count is only minimally elevated to 11.5 which I suspect is a stress response.  She had a CT of the abdomen and pelvis and lumbar spine without contrast that did not show pathologic fracture.  No signs of bowel obstruction and or severe constipation. Did show some spinal stenosis along with gallstones.  She also underwent a right upper quadrant ultrasound that did not show any signs of cholecystitis and she is not having any abdominal pain making me believe that hepatobiliary pathology is unlikely the cause of her symptoms.  Did show a mildly dilated common bile duct which I suspect is age-related since her LFTs are not markedly abnormal.  Upon re-evaluation reported that her pain had improved.  Suspect that her symptoms are likely from either muscle strain or worsening spinal stenosis but does not have any signs of spinal cord compression that would warrant MRI or emergent surgical evaluation.  Will have her follow-up with spine surgery and her  primary doctor.  Will also place her on a bowel regimen for her constipation  This patient presents to the ED for concern of complaints listed in HPI, this involves an extensive number of treatment options, and is a complaint that carries with it a high risk of complications and morbidity. Disposition including potential need for admission considered.   Dispo: DC Home. Return precautions discussed including, but not limited to, those listed in the AVS. Allowed pt time to ask questions which were answered fully prior to dc.  Records reviewed Outpatient Clinic Notes The following labs were independently interpreted: Chemistry and show CKD I independently reviewed the following imaging with scope of interpretation limited to determining acute life threatening conditions related to emergency  care: CT Abdomen/Pelvis and agree with the radiologist interpretation with the following exceptions: none I have reviewed the patients home medications and made adjustments as needed Social Determinants of health:  Geriatric  Portions of this note were generated with Scientist, clinical (histocompatibility and immunogenetics). Dictation errors may occur despite best attempts at proofreading.     Final diagnoses:  Acute midline low back pain without sciatica  Constipation, unspecified constipation type    ED Discharge Orders          Ordered    polyethylene glycol (MIRALAX ) 17 g packet  2 times daily        02/08/24 1414    glycerin adult 2 g suppository  As needed        02/08/24 1415               Yolande Lamar BROCKS, MD 02/09/24 [phone]

## 2024-02-10 DIAGNOSIS — M545 Low back pain, unspecified: Secondary | ICD-10-CM | POA: Diagnosis not present

## 2024-02-12 ENCOUNTER — Other Ambulatory Visit: Payer: Self-pay

## 2024-02-12 ENCOUNTER — Emergency Department (HOSPITAL_COMMUNITY)
Admission: EM | Admit: 2024-02-12 | Discharge: 2024-02-13 | Disposition: A | Discharge status: Home / Self Care | Attending: Emergency Medicine | Admitting: Emergency Medicine

## 2024-02-12 ENCOUNTER — Emergency Department (HOSPITAL_COMMUNITY)

## 2024-02-12 DIAGNOSIS — R7989 Other specified abnormal findings of blood chemistry: Secondary | ICD-10-CM | POA: Insufficient documentation

## 2024-02-12 DIAGNOSIS — R1010 Upper abdominal pain, unspecified: Secondary | ICD-10-CM | POA: Diagnosis not present

## 2024-02-12 DIAGNOSIS — E041 Nontoxic single thyroid nodule: Secondary | ICD-10-CM | POA: Diagnosis not present

## 2024-02-12 DIAGNOSIS — I700 Atherosclerosis of aorta: Secondary | ICD-10-CM | POA: Diagnosis not present

## 2024-02-12 DIAGNOSIS — I10 Essential (primary) hypertension: Secondary | ICD-10-CM | POA: Diagnosis not present

## 2024-02-12 DIAGNOSIS — R1013 Epigastric pain: Secondary | ICD-10-CM | POA: Insufficient documentation

## 2024-02-12 DIAGNOSIS — R918 Other nonspecific abnormal finding of lung field: Secondary | ICD-10-CM | POA: Diagnosis not present

## 2024-02-12 DIAGNOSIS — Z7982 Long term (current) use of aspirin: Secondary | ICD-10-CM | POA: Insufficient documentation

## 2024-02-12 DIAGNOSIS — M546 Pain in thoracic spine: Secondary | ICD-10-CM | POA: Insufficient documentation

## 2024-02-12 DIAGNOSIS — R911 Solitary pulmonary nodule: Secondary | ICD-10-CM | POA: Diagnosis not present

## 2024-02-12 DIAGNOSIS — Z91040 Latex allergy status: Secondary | ICD-10-CM | POA: Diagnosis not present

## 2024-02-12 LAB — URINALYSIS, ROUTINE W REFLEX MICROSCOPIC
Bacteria, UA: NONE SEEN
Bilirubin Urine: NEGATIVE
Glucose, UA: NEGATIVE mg/dL
Hgb urine dipstick: NEGATIVE
Ketones, ur: 5 mg/dL — AB
Leukocytes,Ua: NEGATIVE
Nitrite: NEGATIVE
Protein, ur: 30 mg/dL — AB
Specific Gravity, Urine: 1.024 (ref 1.005–1.030)
pH: 5 (ref 5.0–8.0)

## 2024-02-12 LAB — CBC WITH DIFFERENTIAL/PLATELET
Abs Immature Granulocytes: 0.02 K/uL (ref 0.00–0.07)
Basophils Absolute: 0.1 K/uL (ref 0.0–0.1)
Basophils Relative: 1 %
Eosinophils Absolute: 0.1 K/uL (ref 0.0–0.5)
Eosinophils Relative: 1 %
HCT: 42.6 % (ref 36.0–46.0)
Hemoglobin: 14.2 g/dL (ref 12.0–15.0)
Immature Granulocytes: 0 %
Lymphocytes Relative: 15 %
Lymphs Abs: 1.3 K/uL (ref 0.7–4.0)
MCH: 31.1 pg (ref 26.0–34.0)
MCHC: 33.3 g/dL (ref 30.0–36.0)
MCV: 93.2 fL (ref 80.0–100.0)
Monocytes Absolute: 1.2 K/uL — ABNORMAL HIGH (ref 0.1–1.0)
Monocytes Relative: 13 %
Neutro Abs: 6.3 K/uL (ref 1.7–7.7)
Neutrophils Relative %: 70 %
Platelets: 329 K/uL (ref 150–400)
RBC: 4.57 MIL/uL (ref 3.87–5.11)
RDW: 13.3 % (ref 11.5–15.5)
WBC: 9 K/uL (ref 4.0–10.5)
nRBC: 0 % (ref 0.0–0.2)

## 2024-02-12 LAB — TROPONIN I (HIGH SENSITIVITY): Troponin I (High Sensitivity): 10 ng/L (ref <18)

## 2024-02-12 LAB — COMPREHENSIVE METABOLIC PANEL WITH GFR
ALT: 16 U/L (ref 0–44)
AST: 36 U/L (ref 15–41)
Albumin: 4 g/dL (ref 3.5–5.0)
Alkaline Phosphatase: 102 U/L (ref 38–126)
Anion gap: 19 — ABNORMAL HIGH (ref 5–15)
BUN: 33 mg/dL — ABNORMAL HIGH (ref 8–23)
CO2: 20 mmol/L — ABNORMAL LOW (ref 22–32)
Calcium: 9.6 mg/dL (ref 8.9–10.3)
Chloride: 97 mmol/L — ABNORMAL LOW (ref 98–111)
Creatinine, Ser: 1.45 mg/dL — ABNORMAL HIGH (ref 0.44–1.00)
GFR, Estimated: 34 mL/min — ABNORMAL LOW (ref >60)
Glucose, Bld: 96 mg/dL (ref 70–99)
Potassium: 4 mmol/L (ref 3.5–5.1)
Sodium: 136 mmol/L (ref 135–145)
Total Bilirubin: 0.9 mg/dL (ref 0.0–1.2)
Total Protein: 7.5 g/dL (ref 6.5–8.1)

## 2024-02-12 LAB — D-DIMER, QUANTITATIVE: D-Dimer, Quant: 1.26 ug{FEU}/mL — ABNORMAL HIGH (ref 0.00–0.50)

## 2024-02-12 LAB — LIPASE, BLOOD: Lipase: 47 U/L (ref 11–51)

## 2024-02-12 MED ORDER — IOHEXOL 350 MG/ML SOLN
50.0000 mL | Freq: Once | INTRAVENOUS | Status: AC | PRN
  Administered 2024-02-12: 50 mL via INTRAVENOUS

## 2024-02-12 MED ORDER — PANTOPRAZOLE SODIUM 20 MG PO TBEC
20.0000 mg | DELAYED_RELEASE_TABLET | Freq: Every day | ORAL | 0 refills | Status: AC
Start: 2024-02-12 — End: ?

## 2024-02-12 MED ORDER — SODIUM CHLORIDE 0.9 % IV BOLUS
500.0000 mL | Freq: Once | INTRAVENOUS | Status: AC
  Administered 2024-02-12: 500 mL via INTRAVENOUS

## 2024-02-12 NOTE — ED Triage Notes (Signed)
 Pt to er with daughter, states that she was seen at [facility] on Friday for the same pain, states that they did a ct and ultrasound and found stones in her bile duct, states that she is back today for continued pain, states that she has back and flank pain.

## 2024-02-12 NOTE — Discharge Instructions (Addendum)
 Take the antacid medication to see if it helps with your symptoms.  Try avoiding fatty foods in case that is triggering symptoms with your gallbladder.  Follow up with general surgery and the GI doctor for further evaluation.  The CT scan also showed an incidental thyroid nodule.  The radiologist did recommend a follow up ultrasound that could be arranged with your primary care doctor.

## 2024-02-12 NOTE — ED Triage Notes (Signed)
 Pt gives verbal consent for mse

## 2024-02-12 NOTE — ED Notes (Signed)
 Patient transported to Ultrasound

## 2024-02-12 NOTE — ED Provider Notes (Signed)
 [place] EMERGENCY DEPARTMENT AT [facility] Provider Note   CSN: [id number] Arrival date & time: 02/12/24  1405     Patient presents with: Back Pain   Tracy Rogers is a 89 y.o. female.    Back Pain    Patient states she has been having trouble since last week with pain in her upper abdomen.  It seems to go across and towards her back.  Patient has not noticed anything such as eating or drinking increasing the pain.  Patient has noticed at times she gets a sharp pain in with deep inspiration.  She has not had any vomiting or diarrhea.  No dysuria.  Patient was seen in the emergency room on November 14 for this issue.  At that time she had a CT scan of her abdomen pelvis that did not show any acute findings.  Patient however did have gallstones noted.  She did have a follow-up ultrasound that showed a distended gallbladder with a few gallstones but no changes to suggest acute cholecystitis.  She did have a dilated common bile duct.  Patient states her symptoms have persisted.  She called her doctor and was instructed to come back to the emergency room for evaluation  Prior to Admission medications   Medication Sig Start Date End Date Taking? Authorizing Provider  pantoprazole  (PROTONIX ) 20 MG tablet Take 1 tablet (20 mg total) by mouth daily. 02/12/24  Yes Randol Simmonds, MD  amLODipine  (NORVASC ) 5 MG tablet Take 1 tablet (5 mg total) by mouth daily. 10/16/23   Patwardhan, Newman PARAS, MD  aspirin  EC ([employer] ASPIRIN  LOW DOSE) 81 MG tablet Take 1 tablet (81 mg total) by mouth daily. SWALLOW WHOLE. 01/25/22   Patwardhan, Newman PARAS, MD  Coenzyme Q10 (COQ10) 200 MG CAPS Take 200 mg by mouth in the morning and at bedtime.    [provider]  furosemide  (LASIX ) 40 MG tablet Take 1 tablet (40 mg total) by mouth as needed for fluid. 09/27/23   Patwardhan, Newman PARAS, MD  glycerin adult 2 g suppository Place 1 suppository rectally as needed for constipation. 02/08/24   Yolande Lamar BROCKS,  MD  Lutein  40 MG CAPS Take 40 mg by mouth daily.    [provider]  metoprolol  succinate (TOPROL -XL) 50 MG 24 hr tablet Take 1 tablet (50 mg total) by mouth daily. Take with or immediately following a meal. 10/16/23   Patwardhan, Newman PARAS, MD  metoprolol  tartrate (LOPRESSOR ) 25 MG tablet Take 0.5 tablets (12.5 mg total) by mouth 2 (two) times daily for 7 days, THEN 0.5 tablets (12.5 mg total) daily for 7 days. Then stop. 09/27/23 10/11/23  Patwardhan, Newman PARAS, MD  nitroGLYCERIN  (NITROSTAT ) 0.4 MG SL tablet Place 1 tablet (0.4 mg total) under the tongue every 5 (five) minutes as needed for chest pain. 03/03/22   Patwardhan, Newman PARAS, MD  polyethylene glycol (MIRALAX ) 17 g packet Take 17 g by mouth 2 (two) times daily. 02/08/24   Yolande Lamar BROCKS, MD  rosuvastatin  (CRESTOR ) 10 MG tablet TAKE 1 TABLET BY MOUTH EVERY DAY 09/30/21   Patwardhan, Newman PARAS, MD    Allergies: Latex, Penicillins, Bactrim [sulfamethoxazole-trimethoprim], Benzodiazepines, Hydrocodone, Meloxicam, Other, and Oxycodone     Review of Systems  Musculoskeletal:  Positive for back pain.    Updated Vital Signs BP (!) 155/89 (BP Location: Right Arm)   Pulse 81   Temp 98.2 F (36.8 C) (Oral)   Resp 17   SpO2 100%   Physical Exam Vitals and  nursing note reviewed.  Constitutional:      General: She is not in acute distress.    Appearance: She is well-developed.  HENT:     Head: Normocephalic and atraumatic.     Right Ear: External ear normal.     Left Ear: External ear normal.  Eyes:     General: No scleral icterus.       Right eye: No discharge.        Left eye: No discharge.     Conjunctiva/sclera: Conjunctivae normal.  Neck:     Trachea: No tracheal deviation.  Cardiovascular:     Rate and Rhythm: Normal rate and regular rhythm.  Pulmonary:     Effort: Pulmonary effort is normal. No respiratory distress.     Breath sounds: Normal breath sounds. No stridor. No wheezing or rales.  Abdominal:     General:  Bowel sounds are normal. There is no distension.     Palpations: Abdomen is soft.     Tenderness: There is no abdominal tenderness. There is no guarding or rebound.     Comments: No rebound or guarding  Musculoskeletal:        General: No tenderness or deformity.     Cervical back: Neck supple.  Skin:    General: Skin is warm and dry.     Findings: No rash.  Neurological:     General: No focal deficit present.     Mental Status: She is alert.     Cranial Nerves: No cranial nerve deficit, dysarthria or facial asymmetry.     Sensory: No sensory deficit.     Motor: No abnormal muscle tone or seizure activity.     Coordination: Coordination normal.  Psychiatric:        Mood and Affect: Mood normal.     (all labs ordered are listed, but only abnormal results are displayed) Labs Reviewed  CBC WITH DIFFERENTIAL/PLATELET - Abnormal; Notable for the following components:      Result Value   Monocytes Absolute 1.2 (*)    All other components within normal limits  COMPREHENSIVE METABOLIC PANEL WITH GFR - Abnormal; Notable for the following components:   Chloride 97 (*)    CO2 20 (*)    BUN 33 (*)    Creatinine, Ser 1.45 (*)    GFR, Estimated 34 (*)    Anion gap 19 (*)    All other components within normal limits  URINALYSIS, ROUTINE W REFLEX MICROSCOPIC - Abnormal; Notable for the following components:   Ketones, ur 5 (*)    Protein, ur 30 (*)    All other components within normal limits  D-DIMER, QUANTITATIVE - Abnormal; Notable for the following components:   D-Dimer, Quant 1.26 (*)    All other components within normal limits  LIPASE, BLOOD  TROPONIN I (HIGH SENSITIVITY)    EKG: EKG Interpretation Date/Time:  Tuesday February 12 2024 21:44:06 EST Ventricular Rate:  72 PR Interval:  152 QRS Duration:  94 QT Interval:  388 QTC Calculation: 424 R Axis:   -58  Text Interpretation: Normal sinus rhythm Left anterior fascicular block Left ventricular hypertrophy ( R in aVL ,  Cornell product , Romhilt-Estes ) Abnormal QRS-T angle, consider primary T wave abnormality Abnormal ECG When compared with ECG of 14-Aug-2023 15:52, No significant change since last tracing Confirmed by Randol Simmonds [phone]) on 02/12/2024 9:49:20 PM  Radiology: CT Angio Chest PE W and/or Wo Contrast Result Date: 02/12/2024 CLINICAL DATA:  Positive D-dimer. EXAM: CT ANGIOGRAPHY  CHEST WITH CONTRAST TECHNIQUE: Multidetector CT imaging of the chest was performed using the standard protocol during bolus administration of intravenous contrast. Multiplanar CT image reconstructions and MIPs were obtained to evaluate the vascular anatomy. RADIATION DOSE REDUCTION: This exam was performed according to the departmental dose-optimization program which includes automated exposure control, adjustment of the mA and/or kV according to patient size and/or use of iterative reconstruction technique. CONTRAST:  50mL OMNIPAQUE  IOHEXOL  350 MG/ML SOLN COMPARISON:  None Available. FINDINGS: Cardiovascular: The heart is mildly enlarged. The aorta is ectatic without focal dilatation. There are atherosclerotic calcifications of the aorta and coronary arteries. There is adequate opacification of the pulmonary arteries to the segmental level. There is no pulmonary embolism identified. Mediastinum/Nodes: There is inferior left thyroid nodule measuring 1.9 cm. There are no enlarged mediastinal lymph nodes. Visualized esophagus is within limits. Lungs/Pleura: There are mild diffuse patchy ground-glass opacities throughout both lungs. There is no pleural effusion or pneumothorax. There is minimal atelectasis in the right lung base. Upper Abdomen: No acute abnormality. Musculoskeletal: There severe degenerative changes of both shoulders. There is chronic appearing compression fracture of T8. Review of the MIP images confirms the above findings. IMPRESSION: 1. No evidence for pulmonary embolism. 2. Mild diffuse patchy ground-glass opacities  throughout both lungs, nonspecific. Findings may be related to edema or infection. 3. Mild cardiomegaly. 4. 1.9 cm incidental left thyroid nodule. Recommend thyroid US  (ref: J Am Coll Radiol. 2015 Feb;12(2): 143-50). 5. Aortic atherosclerosis. Aortic Atherosclerosis (ICD10-I70.0). Electronically Signed   By: Greig Pique M.D.   On: 02/12/2024 22:21   US  Abdomen Limited RUQ (LIVER/GB) Result Date: 02/12/2024 EXAM: Right Upper Quadrant Abdominal Ultrasound 02/12/2024 09:19:40 PM TECHNIQUE: Real-time ultrasonography of the right upper quadrant of the abdomen was performed. COMPARISON: US  Abdomen Limited 02/08/2024. CLINICAL HISTORY: Pain localized to upper abdomen for 1 week. FINDINGS: LIVER: The liver demonstrates normal echogenicity. No intrahepatic biliary ductal dilatation. No evidence of mass. BILIARY SYSTEM: Gallbladder wall thickness measures 0.2 cm, which is within normal limits, and there is no abnormal wall thickening. There is mobile echogenic nonshadowing material within the gallbladder lumen compatible with gallbladder sludge or small stones. The gallbladder is not distended. No pericholecystic fluid is identified. The sonographic Beverley sign is reportedly negative. The common bile duct measures 0.4 cm, which is within normal limits. RIGHT KIDNEY: The right kidney is grossly unremarkable in appearances without evidence of hydronephrosis, echogenic calculi or worrisome mass lesions. PANCREAS: Visualized portions of the pancreas are unremarkable. OTHER: No right upper quadrant ascites. IMPRESSION: 1. Gallbladder sludge or tiny stones without evidence of acute cholecystitis Electronically signed by: Dorethia Molt MD 02/12/2024 09:52 PM EST RP Workstation: [id number]     Procedures   Medications Ordered in the ED  sodium chloride  0.9 % bolus 500 mL (500 mLs Intravenous New Bag/Given 02/12/24 2146)  iohexol  (OMNIPAQUE ) 350 MG/ML injection 50 mL (50 mLs Intravenous Contrast Given 02/12/24 2204)     Clinical Course as of 02/12/24 2358  Tue Feb 12, 2024  2044 CBC with Differential(!) CBC normal.  Lipase normal.  Metabolic panel does not show any signs of elevated LFTs.  Bilirubin is slightly elevated compared to previous [JK]  2133 D-dimer, quantitative(!) D-dimer elevated at 1.26.  Lipase normal [JK]  2247 Troponin I (High Sensitivity) Initial troponin normal [JK]  2247 Nonspecific mild patchy ground glass opacities throughout both lungs.  Incidental thyroid nodule noted [JK]  2248 Ultrasound shows gallbladder sludge or tiny stones but no evidence of acute cholecystitis.  Gallbladder  not noted to be distended.  Common bile duct is within normal limits at 0.4 cm [JK]    Clinical Course User Index [JK] Randol Simmonds, MD                                 Medical Decision Making Problems Addressed: Acute thoracic back pain, unspecified back pain laterality: acute illness or injury that poses a threat to life or bodily functions Epigastric pain: acute illness or injury that poses a threat to life or bodily functions  Amount and/or Complexity of Data Reviewed Labs: ordered. Decision-making details documented in ED Course. Radiology: ordered and independent interpretation performed.  Risk Prescription drug management.   Pt presented to the ED with recurrent abd and back pain.  Recent CT Scan 4 days ago without signs of obstruction, infection, aneurysm.   US  showed gallstones, possible duct dilation.  Considered possible cardiac pulmonary etiology.  TROP and EKG reassuring.  CT without signs of pe.  Cardiac or pulmonary symptoms.  PT not having cough or resp symptoms.  Doubt pna  US  repeated and no signs of cholecystitis or dilated cbd.  LFTs also normal.  Pt appears stable for discharge.  Discussed gallbladder diet.  Recc outpt follow up with gen surg and gi.  Outpt follow up with pcp regarding incidental thyroid nodule.  Evaluation and diagnostic testing in the emergency  department does not suggest an emergent condition requiring admission or immediate intervention beyond what has been performed at this time.  The patient is safe for discharge and has been instructed to return immediately for worsening symptoms, change in symptoms or any other concerns.      Final diagnoses:  Acute thoracic back pain, unspecified back pain laterality  Epigastric pain  Thyroid nodule    ED Discharge Orders          Ordered    pantoprazole  (PROTONIX ) 20 MG tablet  Daily        02/12/24 2347               Randol Simmonds, MD 02/12/24 2359

## 2024-02-12 NOTE — ED Provider Triage Note (Signed)
 Emergency Medicine Provider Triage Evaluation Note  Tracy Rogers , a 89 y.o. female  was evaluated in triage.  Pt complains of intermittent right upper quadrant abdominal pain extending into the right mid back since Wednesday of last week.  She was evaluated for this at [facility] on Friday and was scheduled to follow-up with her primary care physician today however the pain returned this morning and primary care recommended she come here for further evaluation instead.  Last bowel movement yesterday with normal consistency and color.  Patient denies any urinary complaints.  She is not sure if the abdominal pain is associated with the eating.  She mostly notices that when she is laying in bed and needs to lay on her left side for rest.  She denies shortness of breath or chest pain.  Denies fevers.  She denies recent falls or injury to this region.  Review of Systems  Positive: Right upper quadrant abdominal pain radiating to the right mid back. Negative: N/V/D, constipation, blood in stool, melena, shortness of breath, chest pain, fevers, urinary complaints, falls.  Physical Exam  BP (!) 158/81   Pulse 83   Temp 98 F (36.7 C) (Oral)   Resp 18   SpO2 97%  Gen:   Awake, no distress Resp:  Normal effort with clear lung sounds throughout MSK:   Moves extremities without difficulty  Other:  No CVA tenderness bilaterally.  No reproducible pain with deep palpation of the right upper quadrant or abdomen in general.  No reproducible pain with palpation of the back or spine.  Medical Decision Making  Medically screening exam initiated at 2:38 PM.  Appropriate orders placed.  Verlin Duke was informed that the remainder of the evaluation will be completed by another provider, this initial triage assessment does not replace that evaluation, and the importance of remaining in the ED until their evaluation is complete.   Rosina Almarie LABOR, PA-C 02/12/24 1446

## 2024-02-13 DIAGNOSIS — M546 Pain in thoracic spine: Secondary | ICD-10-CM | POA: Diagnosis not present

## 2024-02-15 DIAGNOSIS — K8020 Calculus of gallbladder without cholecystitis without obstruction: Secondary | ICD-10-CM | POA: Diagnosis not present

## 2024-02-15 DIAGNOSIS — R1013 Epigastric pain: Secondary | ICD-10-CM | POA: Diagnosis not present

## 2024-02-19 DIAGNOSIS — M7918 Myalgia, other site: Secondary | ICD-10-CM | POA: Diagnosis not present

## 2024-02-19 DIAGNOSIS — K8020 Calculus of gallbladder without cholecystitis without obstruction: Secondary | ICD-10-CM | POA: Diagnosis not present

## 2024-02-19 NOTE — Progress Notes (Signed)
 REFERRING PHYSICIAN:  Chet Marca Lev* PROVIDER:  RICHERD SILVERSMITH, MD MRN: [id number] DOB: 1931-01-21 DATE OF ENCOUNTER: 02/19/2024 Subjective    Reason for Referral: cholelithiasis History of Present Illness: Tracy Rogers is a 89 y.o. female who is seen today as an office consultation for evaluation of cholelithiasis  Patient complains of chronic pain in back. She did have episode of epigastric pain and RUQ. She presented to ED pn 11/14 with abdominal and back pain and constipation. Patient had increased Tbili to 1.4 and CBD 9mm. EDP felt CBD dilation was age related and LFT elevation was not high enough to warrant further workup since on exam she was nontender. She was referred to spine surgery who she has not seen yet and placed on bowel regimen.  Returned to ED on 11/18 with abdominal pain. No pain when evaluated by EDP and no evidence of cholecystitis on US . Labs normal. Her main complaint was again back pain.   Patient has not had any severe abdominal pain like she had the first time she went to EDP on 11/14. It seems to primarily all be musculoskeletal back pain and flank pain. Any right sided pain all sounds positional as she states it only hurts when bending in certain ways to sit or lay down but otherwise no pain. No nausea or emesis beyond first episode 11/14. Suspect she passed a stone then.    Review of Systems: A complete review of systems was obtained from the patient.  I have reviewed this information and discussed as appropriate with the patient.  ROS otherwise negative except as noted in HPI.   Medical History: Past Medical History:  Diagnosis Date   AKI (acute kidney injury) 09/24/2019   Arthritis    Atherosclerosis of native coronary artery of native heart without angina pectoris 02/14/2019   Humerus fracture 08/2019   Hyperlipidemia    Hypertension    Lumbar herniated disc    Osteopenia    Osteoporosis    Sciatica    Spinal stenosis      There is no problem list on file for this patient.   Past Surgical History:  Procedure Laterality Date   lower back surgery [Other  2012   Left heart cath and coronary angiography (N/A)  10/24/2018    Coronary stent intervention (N/A)   10/24/2018   Total hip arthroplasty (Right)  03/19/2020   lower back surgery [Other       Allergies  Allergen Reactions   Latex Hives, Itching and Rash   Penicillin Other (See Comments)    Pt spiked a fever     Bactrim [Sulfamethoxazole-Trimethoprim] Unknown   Benzodiazepines Unknown   Meloxicam Unknown   Other Other (See Comments)    Pt is a Jehovah's witness. No blood transfusions.    Hydrocodone Nausea and Vomiting   Oxycodone  Nausea and Vomiting    Current Outpatient Medications on File Prior to Visit  Medication Sig Dispense Refill   amLODIPine  (NORVASC ) 5 MG tablet Take 5 mg by mouth once daily     FUROsemide  (LASIX ) 40 MG tablet Take 40 mg by mouth as directed     metoprolol  SUCCinate (TOPROL -XL) 50 MG XL tablet Take 50 mg by mouth once daily     nitroGLYcerin  (NITROSTAT ) 0.4 MG SL tablet Place 0.4 mg under the tongue every 5 (five) minutes as needed for Chest pain     pantoprazole  (PROTONIX ) 20 MG DR tablet Take 20 mg by mouth once daily     rosuvastatin  (CRESTOR )  10 MG tablet Take 10 mg by mouth once daily     No current facility-administered medications on file prior to visit.    Family History  Problem Relation Age of Onset   Dementia Mother    Coronary Artery Disease (Blocked arteries around heart) Father    Heart disease Father    Stroke Sister    Addison's disease Sister    Colon polyps Neg Hx    Kidney disease Neg Hx    Esophageal cancer Neg Hx    Gallbladder disease Neg Hx    Colon cancer Neg Hx    Diabetes Neg Hx      Social History   Tobacco Use  Smoking Status Never  Smokeless Tobacco Never     Social History   Socioeconomic History   Marital status: Single   Tobacco Use   Smoking status: Never   Smokeless tobacco: Never  Substance and Sexual Activity   Alcohol  use: Never   Drug use: Never   Social Drivers of Health   Housing Stability: Unknown (02/19/2024)   Housing Stability Vital Sign    Homeless in the Last Year: No    Objective:   Vitals:   02/19/24 1100 02/19/24 1102  Pulse: 80   Temp: 36.8 C (98.3 F)   SpO2: 98%   Weight: 49.6 kg (109 lb 6.4 oz)   Height: 152.4 cm (5')   PainSc:  0-No pain    Body mass index is 21.37 kg/m.  Physical Exam: General: No acute distress, well appearing HEENT: PERRL, hearing grossly normal, mucous membranes moist CV: Regular rate and rhythm Pulm: Normal work of breathing on room air Abd: Soft, nontender, nondistended Back: pain with palpation consistent with MSK pain Extremities: Warm and well perfused Neuro: A&O x4, no focal neurologic deficits Psych: Appropriate mood and effect  Labs, Imaging and Diagnostic Testing: I have personally reviewed all imaging and agree with radiologist's interpretation. RUQ US  02/08/24: Distended gallbladder with few small gallstones, CBD 9mm RUQ US  02/12/24: Gallbladder stones without signs of cholecystitis, no distension I have personally reviewed any pertinent labs. I have personally reviewed PCP referral notes, EDP notes from 11/14 and 11/18.  Assessment and Plan:     Diagnoses and all orders for this visit:  Calculus of gallbladder without cholecystitis without obstruction  Musculoskeletal pain     - We discussed possibility of surgery but given her age she is higher risk. Also it seems her primary pain complaints are her musculoskeletal back pain and not abdominal pain or nausea or emesis as she has not had an episode that seems gallbladder related since initial one on 11/14. We discussed diet to avoid greasy and fatty foods to help avoid additional GB attack - Will reach out to her cardiologist to discuss optimization and risk  stratification if she were to need a cholecystectomy. I am hesitant to offer an operation regardless but this will be helpful in guiding ongoing conversations and options - Directed patient to keep log regarding her pain and note if she does have any non positional RUQ pain/nausea/emesis and what precipitates those symptoms. - Will follow up with her in 2 weeks.     I spent a total of 65 minutes in both face-to-face and non-face-to-face activities, excluding procedures performed, for this visit on the date of this encounter.  Orie Silversmith, MD [facility]

## 2024-03-04 DIAGNOSIS — M47816 Spondylosis without myelopathy or radiculopathy, lumbar region: Secondary | ICD-10-CM | POA: Diagnosis not present

## 2024-03-05 DIAGNOSIS — M7918 Myalgia, other site: Secondary | ICD-10-CM | POA: Diagnosis not present

## 2024-03-05 DIAGNOSIS — K8020 Calculus of gallbladder without cholecystitis without obstruction: Secondary | ICD-10-CM | POA: Diagnosis not present

## 2024-03-06 ENCOUNTER — Telehealth (HOSPITAL_BASED_OUTPATIENT_CLINIC_OR_DEPARTMENT_OTHER): Payer: Self-pay

## 2024-03-06 ENCOUNTER — Telehealth (HOSPITAL_BASED_OUTPATIENT_CLINIC_OR_DEPARTMENT_OTHER): Payer: Self-pay | Admitting: *Deleted

## 2024-03-06 NOTE — Telephone Encounter (Signed)
° °  Pre-operative Risk Assessment    Patient Name: Tracy Rogers  DOB: 12-11-1930 MRN: [id number]   Date of last office visit: 09/27/23 with Dr. Elmira  Date of next office visit: NA  Request for Surgical Clearance    Procedure:  laparoscopic cholecystectomy surgery  Date of Surgery:  Clearance TBD                                 Surgeon:  Dr. Ann Socks Group or Practice Name:  [facility] Phone number:  [phone] Fax number:  [phone]   Type of Clearance Requested:   - Medical  - Pharmacy:  Hold Aspirin  not indicated   Type of Anesthesia:  General    Additional requests/questions:    SignedAugustin JONETTA Daring   03/06/2024, 8:35 AM

## 2024-03-06 NOTE — Telephone Encounter (Signed)
 Pt has been scheduled tele preop appt 03/11/24. Med rec and consent are done.      Patient Consent for Virtual Visit        Tracy Rogers has provided verbal consent on 03/06/2024 for a virtual visit (video or telephone).   CONSENT FOR VIRTUAL VISIT FOR:  Tracy Rogers  By participating in this virtual visit I agree to the following:  I hereby voluntarily request, consent and authorize [facility] and its employed or contracted physicians, physician assistants, nurse practitioners or other licensed health care professionals (the Practitioner), to provide me with telemedicine health care services (the Services) as deemed necessary by the treating Practitioner. I acknowledge and consent to receive the Services by the Practitioner via telemedicine. I understand that the telemedicine visit will involve communicating with the Practitioner through live audiovisual communication technology and the disclosure of certain medical information by electronic transmission. I acknowledge that I have been given the opportunity to request an in-person assessment or other available alternative prior to the telemedicine visit and am voluntarily participating in the telemedicine visit.  I understand that I have the right to withhold or withdraw my consent to the use of telemedicine in the course of my care at any time, without affecting my right to future care or treatment, and that the Practitioner or I may terminate the telemedicine visit at any time. I understand that I have the right to inspect all information obtained and/or recorded in the course of the telemedicine visit and may receive copies of available information for a reasonable fee.  I understand that some of the potential risks of receiving the Services via telemedicine include:  Delay or interruption in medical evaluation due to technological equipment failure or disruption; Information transmitted may not be sufficient (e.g.  poor resolution of images) to allow for appropriate medical decision making by the Practitioner; and/or  In rare instances, security protocols could fail, causing a breach of personal health information.  Furthermore, I acknowledge that it is my responsibility to provide information about my medical history, conditions and care that is complete and accurate to the best of my ability. I acknowledge that Practitioner's advice, recommendations, and/or decision may be based on factors not within their control, such as incomplete or inaccurate data provided by me or distortions of diagnostic images or specimens that may result from electronic transmissions. I understand that the practice of medicine is not an exact science and that Practitioner makes no warranties or guarantees regarding treatment outcomes. I acknowledge that a copy of this consent can be made available to me via my patient portal [facility] MyChart), or I can request a printed copy by calling the office of [facility].    I understand that my insurance will be billed for this visit.   I have read or had this consent read to me. I understand the contents of this consent, which adequately explains the benefits and risks of the Services being provided via telemedicine.  I have been provided ample opportunity to ask questions regarding this consent and the Services and have had my questions answered to my satisfaction. I give my informed consent for the services to be provided through the use of telemedicine in my medical care

## 2024-03-06 NOTE — Telephone Encounter (Signed)
 Pt has been scheduled tele preop appt 03/11/24. Med rec and consent are done.

## 2024-03-06 NOTE — Telephone Encounter (Signed)
 While speaking with the pt she decided to hold off on scheduling the tele until after she see's PCP on 03/13/24. Pt said that she will call back and let us  know if she is going to proceed with Lap chole Dr. Ann.

## 2024-03-06 NOTE — Telephone Encounter (Signed)
° °  Name: Tracy Rogers  DOB: 03/01/1931  MRN: [id number]  Primary Cardiologist: Newman JINNY Lawrence, MD   Preoperative team, please contact this patient and set up a phone call appointment for further preoperative risk assessment. Please obtain consent and complete medication review. Thank you for your help.  I confirm that guidance regarding antiplatelet and oral anticoagulation therapy has been completed and, if necessary, noted below.  Regarding ASA therapy, we recommend continuation of ASA throughout the perioperative period.  However, if the surgeon feels that cessation of ASA is required in the perioperative period, it may be stopped 5-7 days prior to surgery with a plan to resume it as soon as felt to be feasible from a surgical standpoint in the post-operative period.   I also confirmed the patient resides in the state of [place] . As per [facility] [employer] telemedicine laws, the patient must reside in the state in which the provider is licensed.   Lum LITTIE Louis, NP 03/06/2024, 8:46 AM Tracy Rogers

## 2024-03-11 NOTE — Telephone Encounter (Signed)
 Niece called to speak with the pre opp, advise that we dont have dpr on file for her. She states that she has POA. Please advise

## 2024-03-13 DIAGNOSIS — R1013 Epigastric pain: Secondary | ICD-10-CM | POA: Diagnosis not present

## 2024-03-13 DIAGNOSIS — I10 Essential (primary) hypertension: Secondary | ICD-10-CM | POA: Diagnosis not present

## 2024-03-13 DIAGNOSIS — K8020 Calculus of gallbladder without cholecystitis without obstruction: Secondary | ICD-10-CM | POA: Diagnosis not present

## 2024-03-13 DIAGNOSIS — E041 Nontoxic single thyroid nodule: Secondary | ICD-10-CM | POA: Diagnosis not present

## 2024-03-14 NOTE — Telephone Encounter (Signed)
 Received fax from [facility] to disregard clearance as she is being referred out.  Will cancel appointment.

## 2024-03-28 ENCOUNTER — Other Ambulatory Visit (HOSPITAL_COMMUNITY): Payer: Self-pay | Admitting: Neurological Surgery

## 2024-03-28 DIAGNOSIS — M5414 Radiculopathy, thoracic region: Secondary | ICD-10-CM

## 2024-03-28 DIAGNOSIS — M47816 Spondylosis without myelopathy or radiculopathy, lumbar region: Secondary | ICD-10-CM
